# Patient Record
Sex: Male | Born: 1960 | Race: White | Hispanic: No | Marital: Married | Smoking: Current every day smoker
Health system: Southern US, Community
[De-identification: ages and names within clinical notes are randomized; demographics above are authoritative.]

## PROBLEM LIST (undated history)

## (undated) DIAGNOSIS — I10 Essential (primary) hypertension: Secondary | ICD-10-CM

## (undated) DIAGNOSIS — G4730 Sleep apnea, unspecified: Secondary | ICD-10-CM

## (undated) DIAGNOSIS — E785 Hyperlipidemia, unspecified: Secondary | ICD-10-CM

---

## 2007-12-07 IMAGING — CR DG CHEST 2V
1 series · 2 of 2 positions shown · non-contrast
Comparison: NONE

CLINICAL DATA: Attn:  DIONTE Cough. 

CHEST TWO VIEW (PA AND LATERAL)

[Series 1: view not recorded · 0.17mm/px · 2 of 2 slices shown]
[im 1/2]
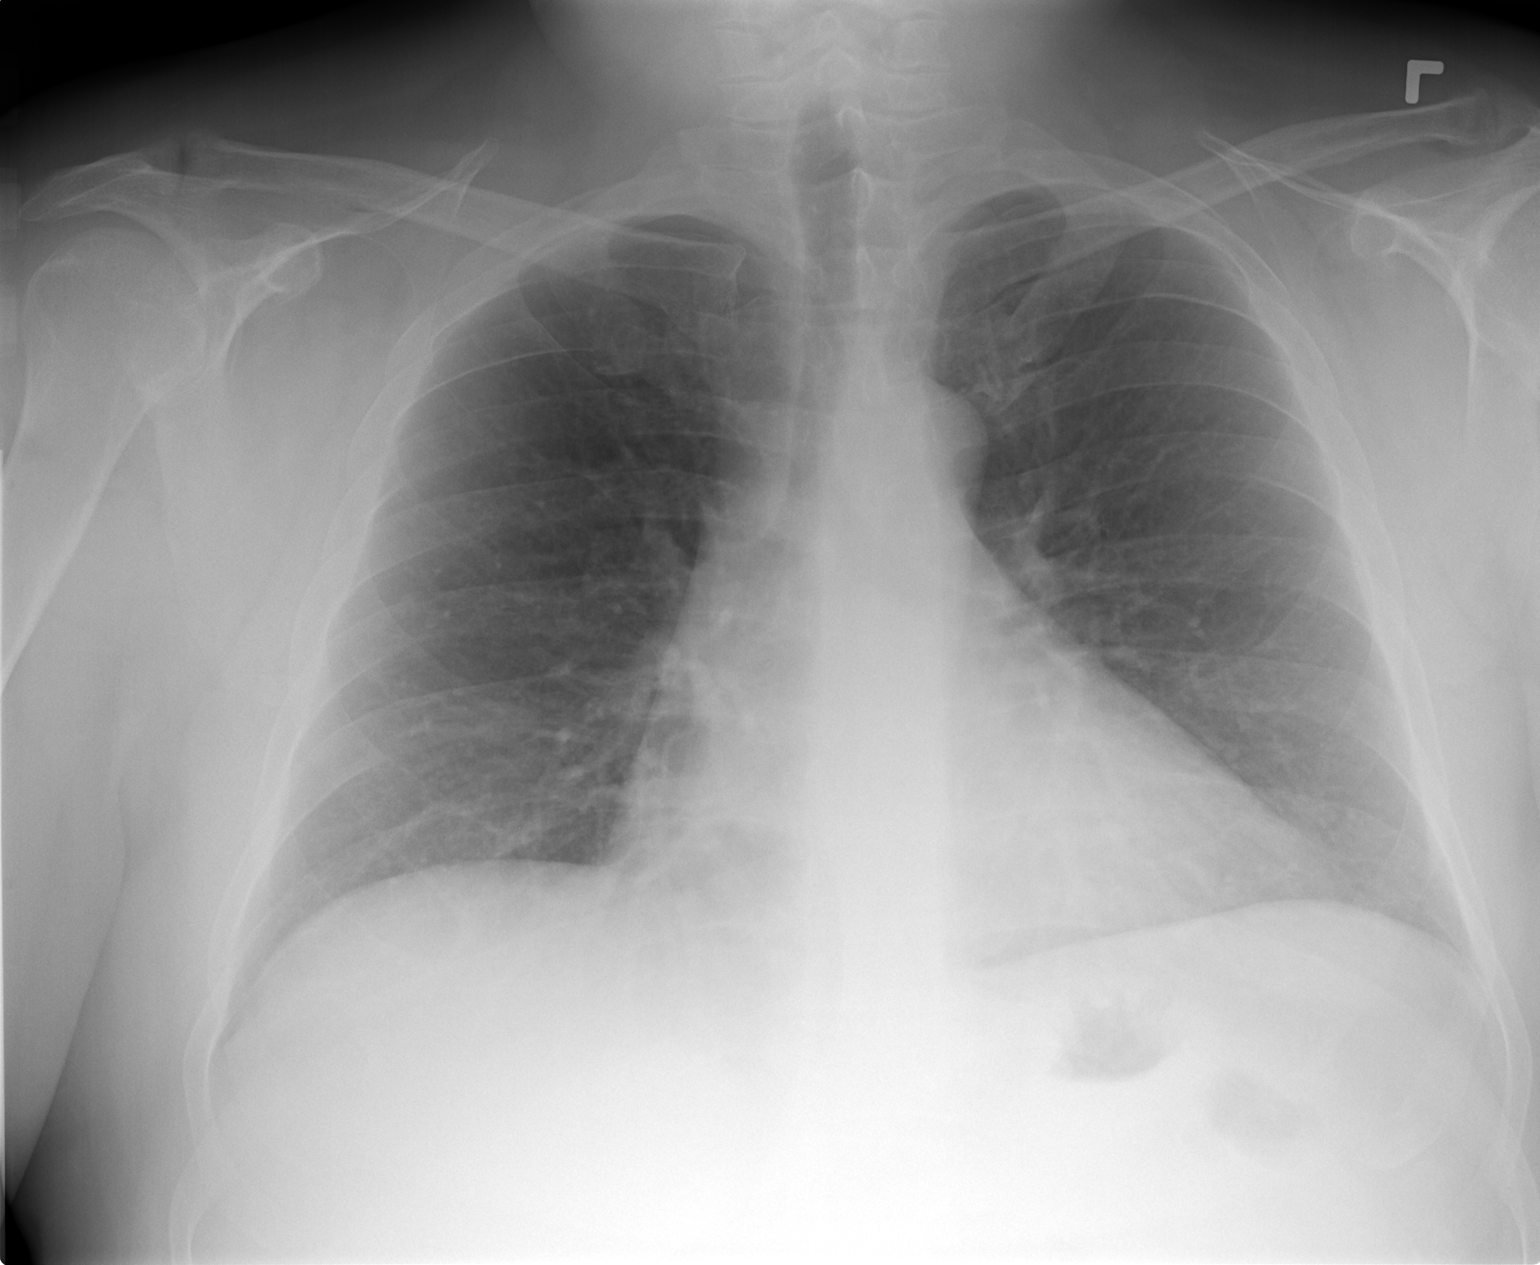
[im 2/2]
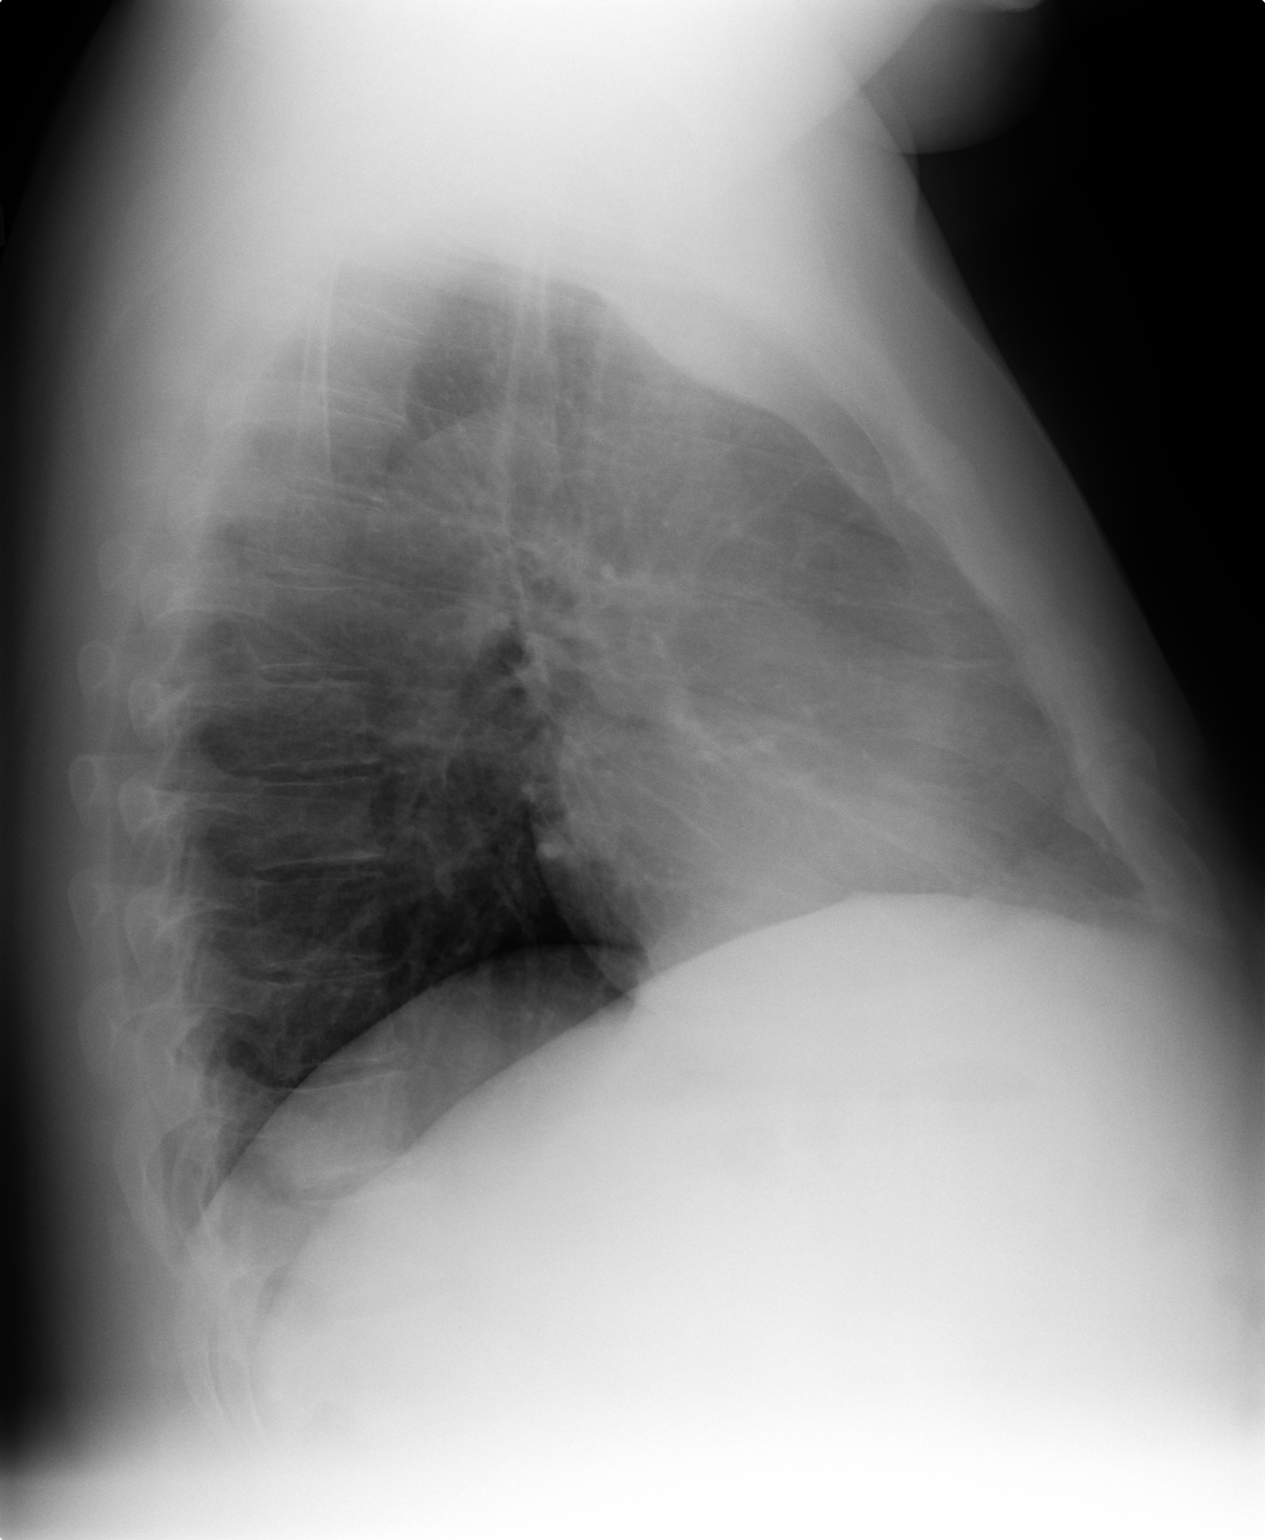

[2 of 2 positions shown; findings below may reference images not displayed]

FINDINGS: The lungs are clear and well expanded.   Heart and 
pulmonary vessels are normal.  No significant abnormalities are 
noted in the regional skeleton. 

electronically reviewed on [DATE] Dict Date: [DATE]  Tran 
Date:  [DATE] DIONTE

## 2017-04-01 ENCOUNTER — Other Ambulatory Visit (HOSPITAL_BASED_OUTPATIENT_CLINIC_OR_DEPARTMENT_OTHER): Payer: Self-pay

## 2017-04-01 DIAGNOSIS — G4733 Obstructive sleep apnea (adult) (pediatric): Secondary | ICD-10-CM

## 2017-04-01 DIAGNOSIS — R0902 Hypoxemia: Secondary | ICD-10-CM

## 2017-04-15 ENCOUNTER — Ambulatory Visit (HOSPITAL_BASED_OUTPATIENT_CLINIC_OR_DEPARTMENT_OTHER): Payer: 59 | Attending: Internal Medicine | Admitting: Internal Medicine

## 2017-04-15 DIAGNOSIS — R0902 Hypoxemia: Secondary | ICD-10-CM

## 2017-04-15 DIAGNOSIS — G4733 Obstructive sleep apnea (adult) (pediatric): Secondary | ICD-10-CM | POA: Diagnosis not present

## 2017-04-17 NOTE — Procedures (Signed)
   NAME: Philip Rhodes DATE OF BIRTH:  1960-03-18 MEDICAL RECORD NUMBER [id number]  LOCATION: [facility]  PHYSICIAN: Deretha EmoryJames C Osborne  DATE OF STUDY: 04/15/2017  SLEEP STUDY TYPE: Nocturnal Polysomnogram               REFERRING PHYSICIAN: Deretha Emorysborne, James C, MD  INDICATION FOR STUDY: CPAP titration needed as patient had inadequate oxygenation on APAP at home despite good airway control  EPWORTH SLEEPINESS SCORE:  2 HEIGHT: 5\' 8"  (172.7 cm)  WEIGHT: 295 lb (133.8 kg)    Body mass index is 44.85 kg/m.  NECK SIZE: 20 in.  MEDICATIONS Patient self administered medications include: N/A. Medications administered during study include No sleep medicine administered.Marland Kitchen.  SLEEP STUDY TECHNIQUE The patient underwent an attended overnight polysomnography titration to assess the effects of cpap therapy. The following variables were monitored: EEG(C4-A1, C3-A2, O1-A2, O2-A1), EOG, submental and leg EMG, ECG, oxyhemoglobin saturation by pulse oximetry, thoracic and abdominal respiratory effort belts, nasal/oral airflow by pressure sensor, body position sensor and snoring sensor. CPAP pressure was titrated to eliminate apneas, hypopneas and oxygen desaturation.  TECHNICAL COMMENTS Comments added by Technician: Patient was restless all through the night. Patient had difficulty initiating sleep. Comments added by Scorer: N/A  SLEEP ARCHITECTURE The study was initiated at 10:53:09 PM and terminated at 5:23:45 AM. Total recorded time was 390.6 minutes. EEG confirmed total sleep time was 316.0 minutes yielding a sleep efficiency of 80.9%. Sleep onset after lights out was 11.2 minutes with a REM latency of 180.0 minutes. The patient spent 11.23% of the night in stage N1 sleep, 68.83% in stage N2 sleep, 0.00% in stage N3 and 19.94% in REM. The Arousal Index was 32.1/hour.  RESPIRATORY PARAMETERS The overall AHI was 5.3 per hour, and the RDI was 14.8 events/hour with a central apnea  index of 0.0per hour. The most appropriate setting of CPAP was IPAP/EPAP 19/19 cm H2O. At this setting, the sleep efficiency was 89% and the patient was supine for 100%, the AHI was 0.0 events per hour, and the RDI was 1.7 events/hour (with 0.0 central events) and the arousal index was 8.4 per hour.The oxygen nadir was 92.0% during sleep.  LEG MOVEMENT DATA The total leg movements were with a resulting leg movement index of 0/hr. Associated arousal with leg movement index was 0.0/hr.  CARDIAC DATA The underlying cardiac rhythm was most consistent with sinus rhythm. Mean heart rate during sleep was 59 bpm. Additional rhythm abnormalities include None.  IMPRESSIONS - Successful CPAP tiration to a pressure of 19 with good airway control and adequate oxygenation  DIAGNOSIS - Obstructive Sleep Apnea (327.23 [G47.33 ICD-10])  RECOMMENDATIONS - Use of CPAP 19 with ramp to allow him to get to sleep adequately before pressure get too high.   Deretha EmoryJames C Osborne Sleep specialist, [employer]  [employer]  ELECTRONICALLY SIGNED ON:  04/17/2017, 9:18 PM [facility] PH: [phone]   FX: [phone] ACCREDITED BY THE [employer] SLE[employer]IC[employer]

## 2018-04-11 IMAGING — CR DG ABDOMEN 2V
3 series · 3 of 3 positions shown · non-contrast
Comparison: [DATE] [DATE], [DATE].  [DATE] [DATE], [DATE].

CLINICAL DATA: Biliary stent.

EXAM:
ABDOMEN - 2 VIEW

[w abdomen upright]
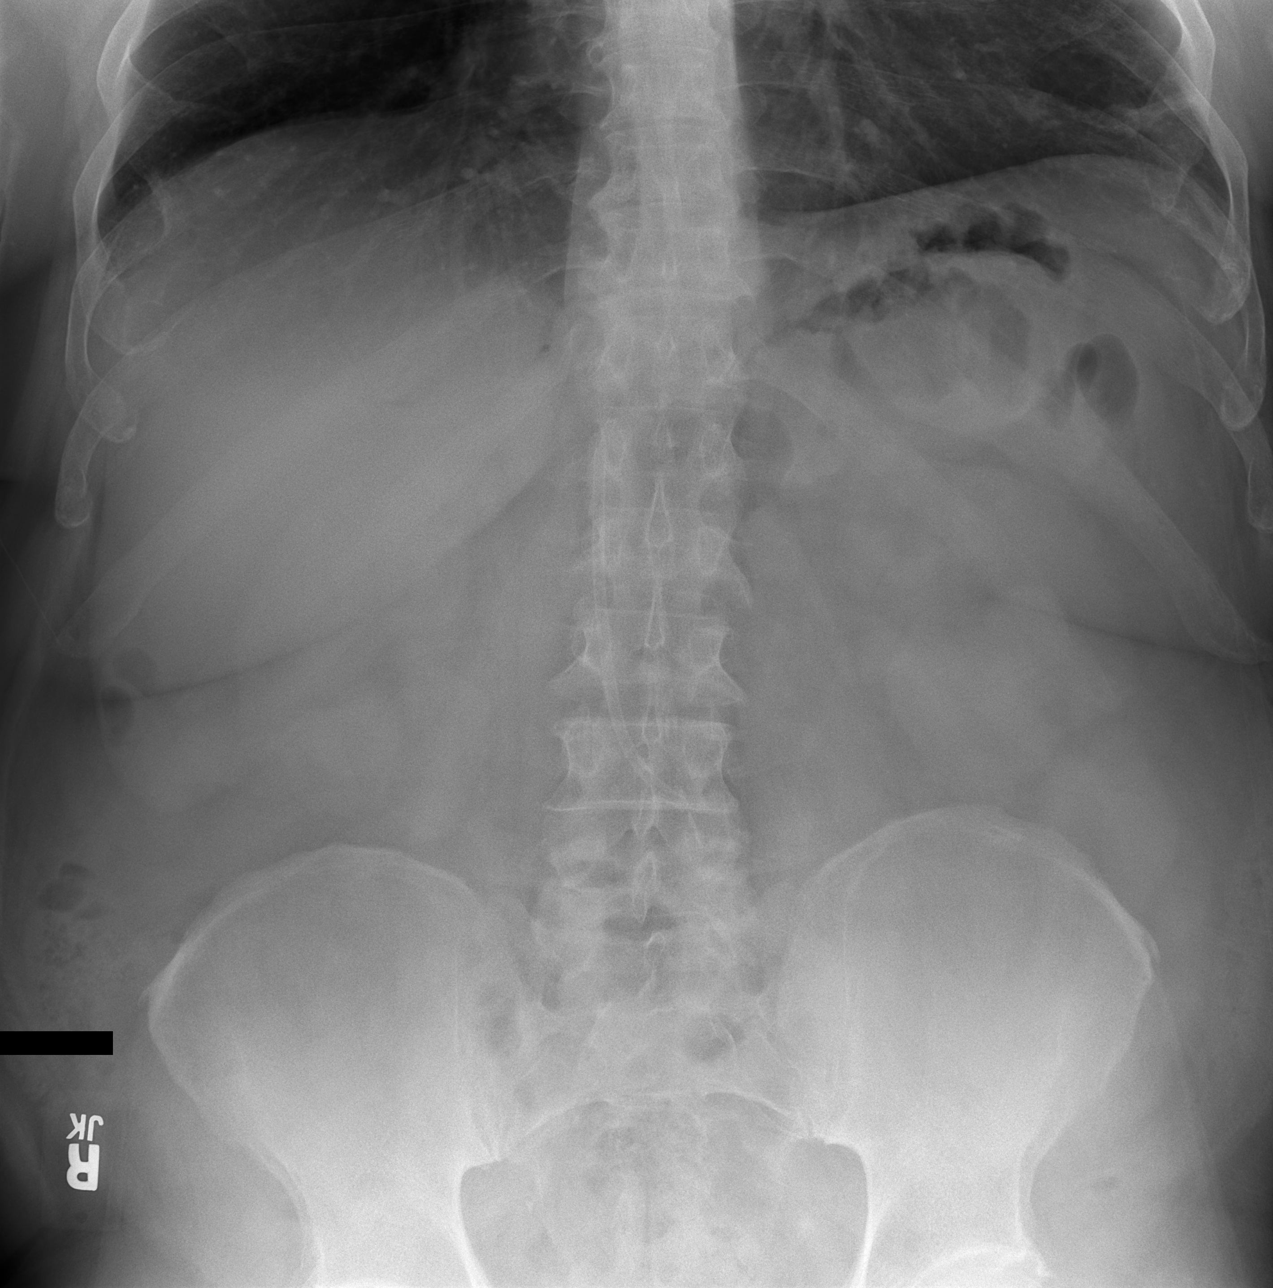

[t abdomen supine (1 of 2)]
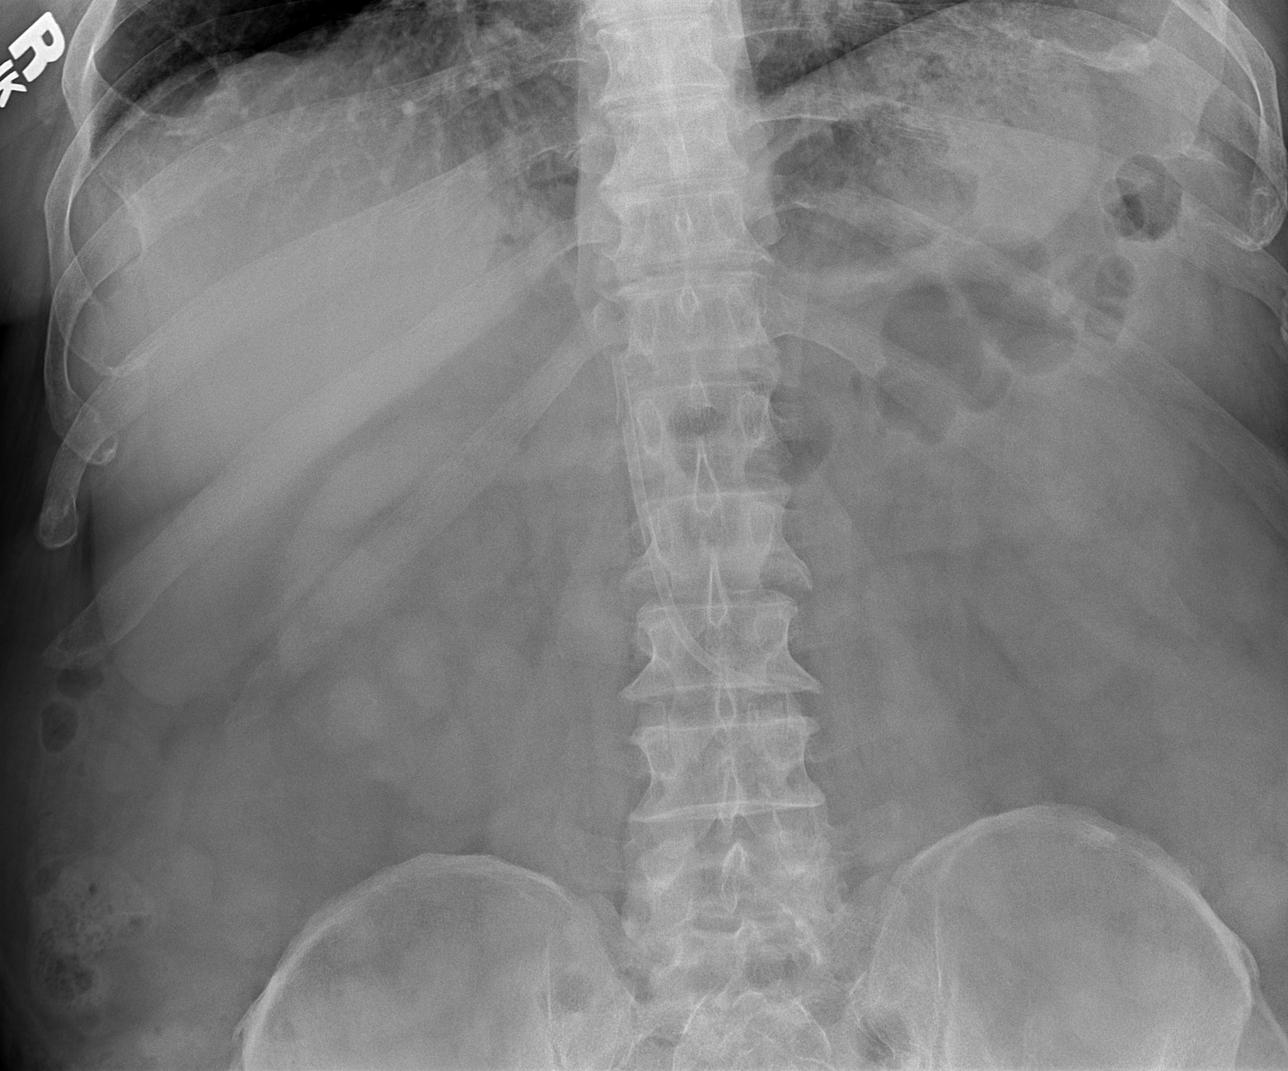

[t abdomen supine (2 of 2)]
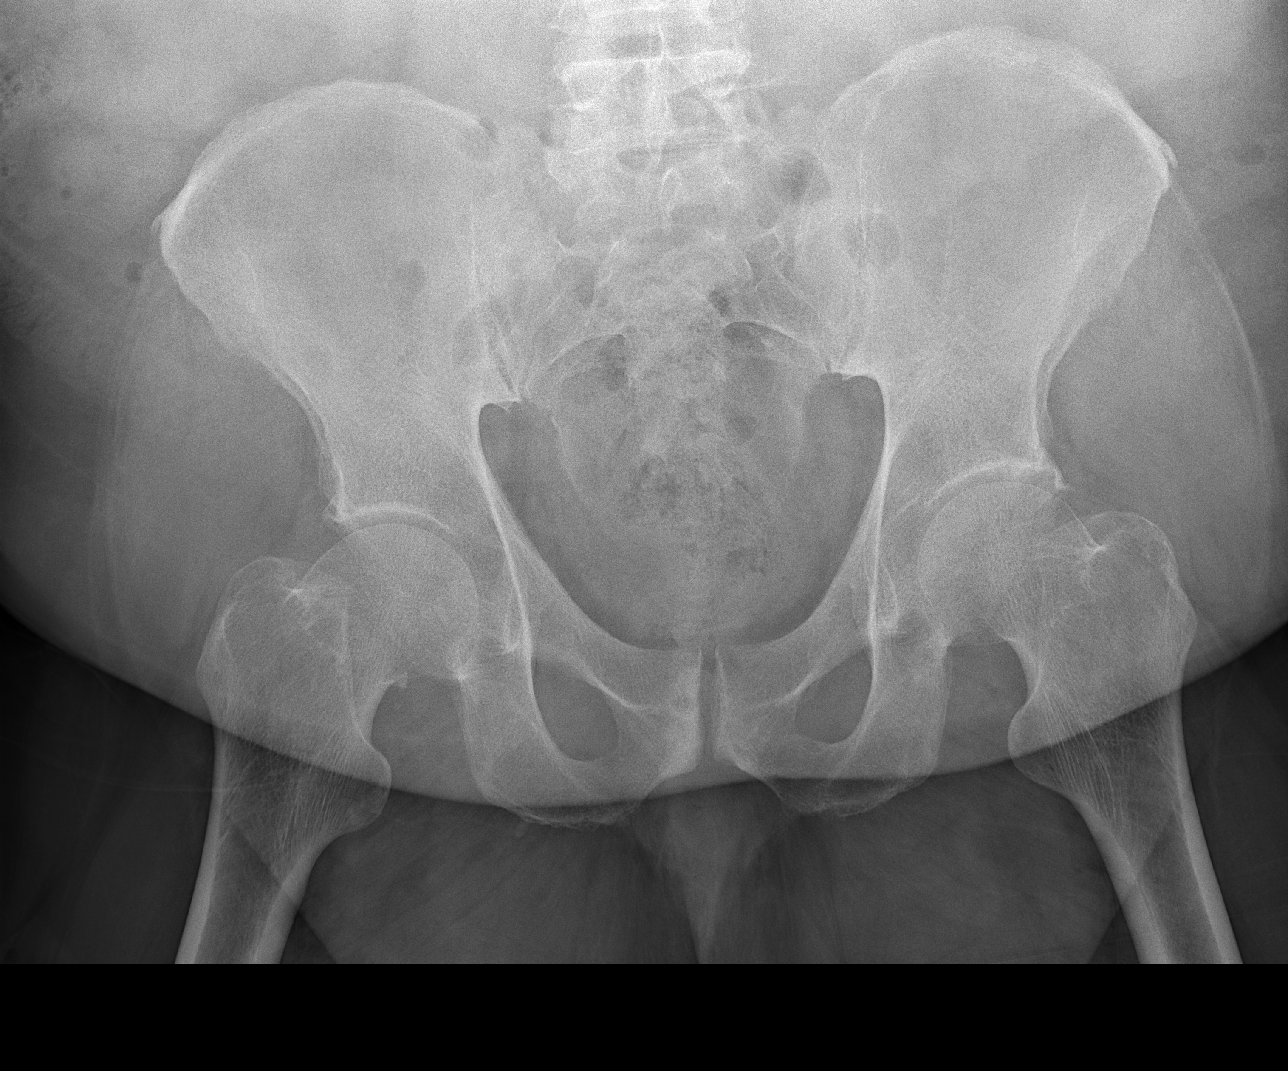

[3 of 3 positions shown; findings below may reference images not displayed]

FINDINGS: The bowel gas pattern is normal. There is no evidence of free air.
Stable position of biliary stent is noted, which most likely is
within the distal common bile duct and extension into the duodenum.
IMPRESSION: Grossly stable position of bile duct stent as described above.

## 2018-11-11 ENCOUNTER — Other Ambulatory Visit: Payer: Self-pay | Admitting: Family Medicine

## 2018-11-11 ENCOUNTER — Inpatient Hospital Stay (HOSPITAL_COMMUNITY)
Admission: EM | Admit: 2018-11-11 | Discharge: 2018-11-15 | DRG: 439 | Disposition: A | Discharge status: Home / Self Care | Payer: 59 | Source: Ambulatory Visit | Attending: Internal Medicine | Admitting: Internal Medicine

## 2018-11-11 ENCOUNTER — Other Ambulatory Visit: Payer: Self-pay

## 2018-11-11 ENCOUNTER — Ambulatory Visit
Admission: RE | Admit: 2018-11-11 | Discharge: 2018-11-11 | Disposition: A | Discharge status: Home / Self Care | Payer: 59 | Source: Ambulatory Visit | Attending: Family Medicine | Admitting: Family Medicine

## 2018-11-11 ENCOUNTER — Encounter (HOSPITAL_COMMUNITY): Payer: Self-pay

## 2018-11-11 ENCOUNTER — Emergency Department (HOSPITAL_COMMUNITY): Payer: 59

## 2018-11-11 DIAGNOSIS — Z79899 Other long term (current) drug therapy: Secondary | ICD-10-CM

## 2018-11-11 DIAGNOSIS — K760 Fatty (change of) liver, not elsewhere classified: Secondary | ICD-10-CM | POA: Diagnosis present

## 2018-11-11 DIAGNOSIS — R945 Abnormal results of liver function studies: Secondary | ICD-10-CM | POA: Diagnosis not present

## 2018-11-11 DIAGNOSIS — Z8249 Family history of ischemic heart disease and other diseases of the circulatory system: Secondary | ICD-10-CM

## 2018-11-11 DIAGNOSIS — K859 Acute pancreatitis without necrosis or infection, unspecified: Secondary | ICD-10-CM | POA: Diagnosis not present

## 2018-11-11 DIAGNOSIS — R7303 Prediabetes: Secondary | ICD-10-CM | POA: Diagnosis present

## 2018-11-11 DIAGNOSIS — F17210 Nicotine dependence, cigarettes, uncomplicated: Secondary | ICD-10-CM | POA: Diagnosis present

## 2018-11-11 DIAGNOSIS — G4730 Sleep apnea, unspecified: Secondary | ICD-10-CM | POA: Diagnosis present

## 2018-11-11 DIAGNOSIS — Z6841 Body Mass Index (BMI) 40.0 and over, adult: Secondary | ICD-10-CM

## 2018-11-11 DIAGNOSIS — K863 Pseudocyst of pancreas: Secondary | ICD-10-CM | POA: Diagnosis present

## 2018-11-11 DIAGNOSIS — K831 Obstruction of bile duct: Secondary | ICD-10-CM

## 2018-11-11 DIAGNOSIS — K852 Alcohol induced acute pancreatitis without necrosis or infection: Secondary | ICD-10-CM | POA: Diagnosis not present

## 2018-11-11 DIAGNOSIS — Z7982 Long term (current) use of aspirin: Secondary | ICD-10-CM

## 2018-11-11 DIAGNOSIS — R109 Unspecified abdominal pain: Secondary | ICD-10-CM

## 2018-11-11 DIAGNOSIS — I10 Essential (primary) hypertension: Secondary | ICD-10-CM | POA: Diagnosis not present

## 2018-11-11 DIAGNOSIS — Z20828 Contact with and (suspected) exposure to other viral communicable diseases: Secondary | ICD-10-CM | POA: Diagnosis present

## 2018-11-11 DIAGNOSIS — R7989 Other specified abnormal findings of blood chemistry: Secondary | ICD-10-CM | POA: Diagnosis present

## 2018-11-11 DIAGNOSIS — E785 Hyperlipidemia, unspecified: Secondary | ICD-10-CM | POA: Diagnosis present

## 2018-11-11 DIAGNOSIS — K7010 Alcoholic hepatitis without ascites: Secondary | ICD-10-CM | POA: Diagnosis present

## 2018-11-11 HISTORY — DX: Hyperlipidemia, unspecified: E78.5

## 2018-11-11 HISTORY — DX: Essential (primary) hypertension: I10

## 2018-11-11 HISTORY — DX: Sleep apnea, unspecified: G47.30

## 2018-11-11 LAB — COMPREHENSIVE METABOLIC PANEL
ALT: 169 U/L — ABNORMAL HIGH (ref 0–44)
AST: 88 U/L — ABNORMAL HIGH (ref 15–41)
Albumin: 3.7 g/dL (ref 3.5–5.0)
Alkaline Phosphatase: 87 U/L (ref 38–126)
Anion gap: 15 (ref 5–15)
BUN: 5 mg/dL — ABNORMAL LOW (ref 6–20)
CO2: 21 mmol/L — ABNORMAL LOW (ref 22–32)
Calcium: 9 mg/dL (ref 8.9–10.3)
Chloride: 91 mmol/L — ABNORMAL LOW (ref 98–111)
Creatinine, Ser: 0.58 mg/dL — ABNORMAL LOW (ref 0.61–1.24)
GFR calc Af Amer: >60 mL/min (ref >60)
GFR calc non Af Amer: >60 mL/min (ref >60)
Glucose, Bld: 191 mg/dL — ABNORMAL HIGH (ref 70–99)
Potassium: 3.1 mmol/L — ABNORMAL LOW (ref 3.5–5.1)
Sodium: 127 mmol/L — ABNORMAL LOW (ref 135–145)
Total Bilirubin: 2.4 mg/dL — ABNORMAL HIGH (ref 0.3–1.2)
Total Protein: 7.5 g/dL (ref 6.5–8.1)

## 2018-11-11 LAB — CBC WITH DIFFERENTIAL/PLATELET
Abs Immature Granulocytes: 0.1 10*3/uL — ABNORMAL HIGH (ref 0.00–0.07)
Basophils Absolute: 0.1 10*3/uL (ref 0.0–0.1)
Basophils Relative: 1 %
Eosinophils Absolute: 0.2 10*3/uL (ref 0.0–0.5)
Eosinophils Relative: 1 %
HCT: 46.4 % (ref 39.0–52.0)
Hemoglobin: 16.1 g/dL (ref 13.0–17.0)
Immature Granulocytes: 1 %
Lymphocytes Relative: 17 %
Lymphs Abs: 2.9 10*3/uL (ref 0.7–4.0)
MCH: 32.3 pg (ref 26.0–34.0)
MCHC: 34.7 g/dL (ref 30.0–36.0)
MCV: 93 fL (ref 80.0–100.0)
Monocytes Absolute: 1.5 10*3/uL — ABNORMAL HIGH (ref 0.1–1.0)
Monocytes Relative: 9 %
Neutro Abs: 12.1 10*3/uL — ABNORMAL HIGH (ref 1.7–7.7)
Neutrophils Relative %: 71 %
Platelets: 389 10*3/uL (ref 150–400)
RBC: 4.99 MIL/uL (ref 4.22–5.81)
RDW: 13.7 % (ref 11.5–15.5)
WBC: 16.9 10*3/uL — ABNORMAL HIGH (ref 4.0–10.5)
nRBC: 0 % (ref 0.0–0.2)

## 2018-11-11 LAB — LACTIC ACID, PLASMA: Lactic Acid, Venous: 1.6 mmol/L (ref 0.5–1.9)

## 2018-11-11 LAB — URINALYSIS, ROUTINE W REFLEX MICROSCOPIC
Bacteria, UA: NONE SEEN
Bilirubin Urine: NEGATIVE
Glucose, UA: NEGATIVE mg/dL
Hgb urine dipstick: NEGATIVE
Ketones, ur: 5 mg/dL — AB
Leukocytes,Ua: NEGATIVE
Nitrite: NEGATIVE
Protein, ur: 30 mg/dL — AB
Specific Gravity, Urine: 1.029 (ref 1.005–1.030)
pH: 6 (ref 5.0–8.0)

## 2018-11-11 LAB — PROTIME-INR
INR: 1.1 (ref 0.8–1.2)
Prothrombin Time: 13.7 seconds (ref 11.4–15.2)

## 2018-11-11 LAB — LIPASE, BLOOD: Lipase: 390 U/L — ABNORMAL HIGH (ref 11–51)

## 2018-11-11 IMAGING — DX CHEST - 2 VIEW
2 series · 2 of 2 positions shown · non-contrast
Comparison: [DATE]

CLINICAL DATA: Sepsis.  Hypertension.

EXAM:
CHEST - 2 VIEW

[chest pa]
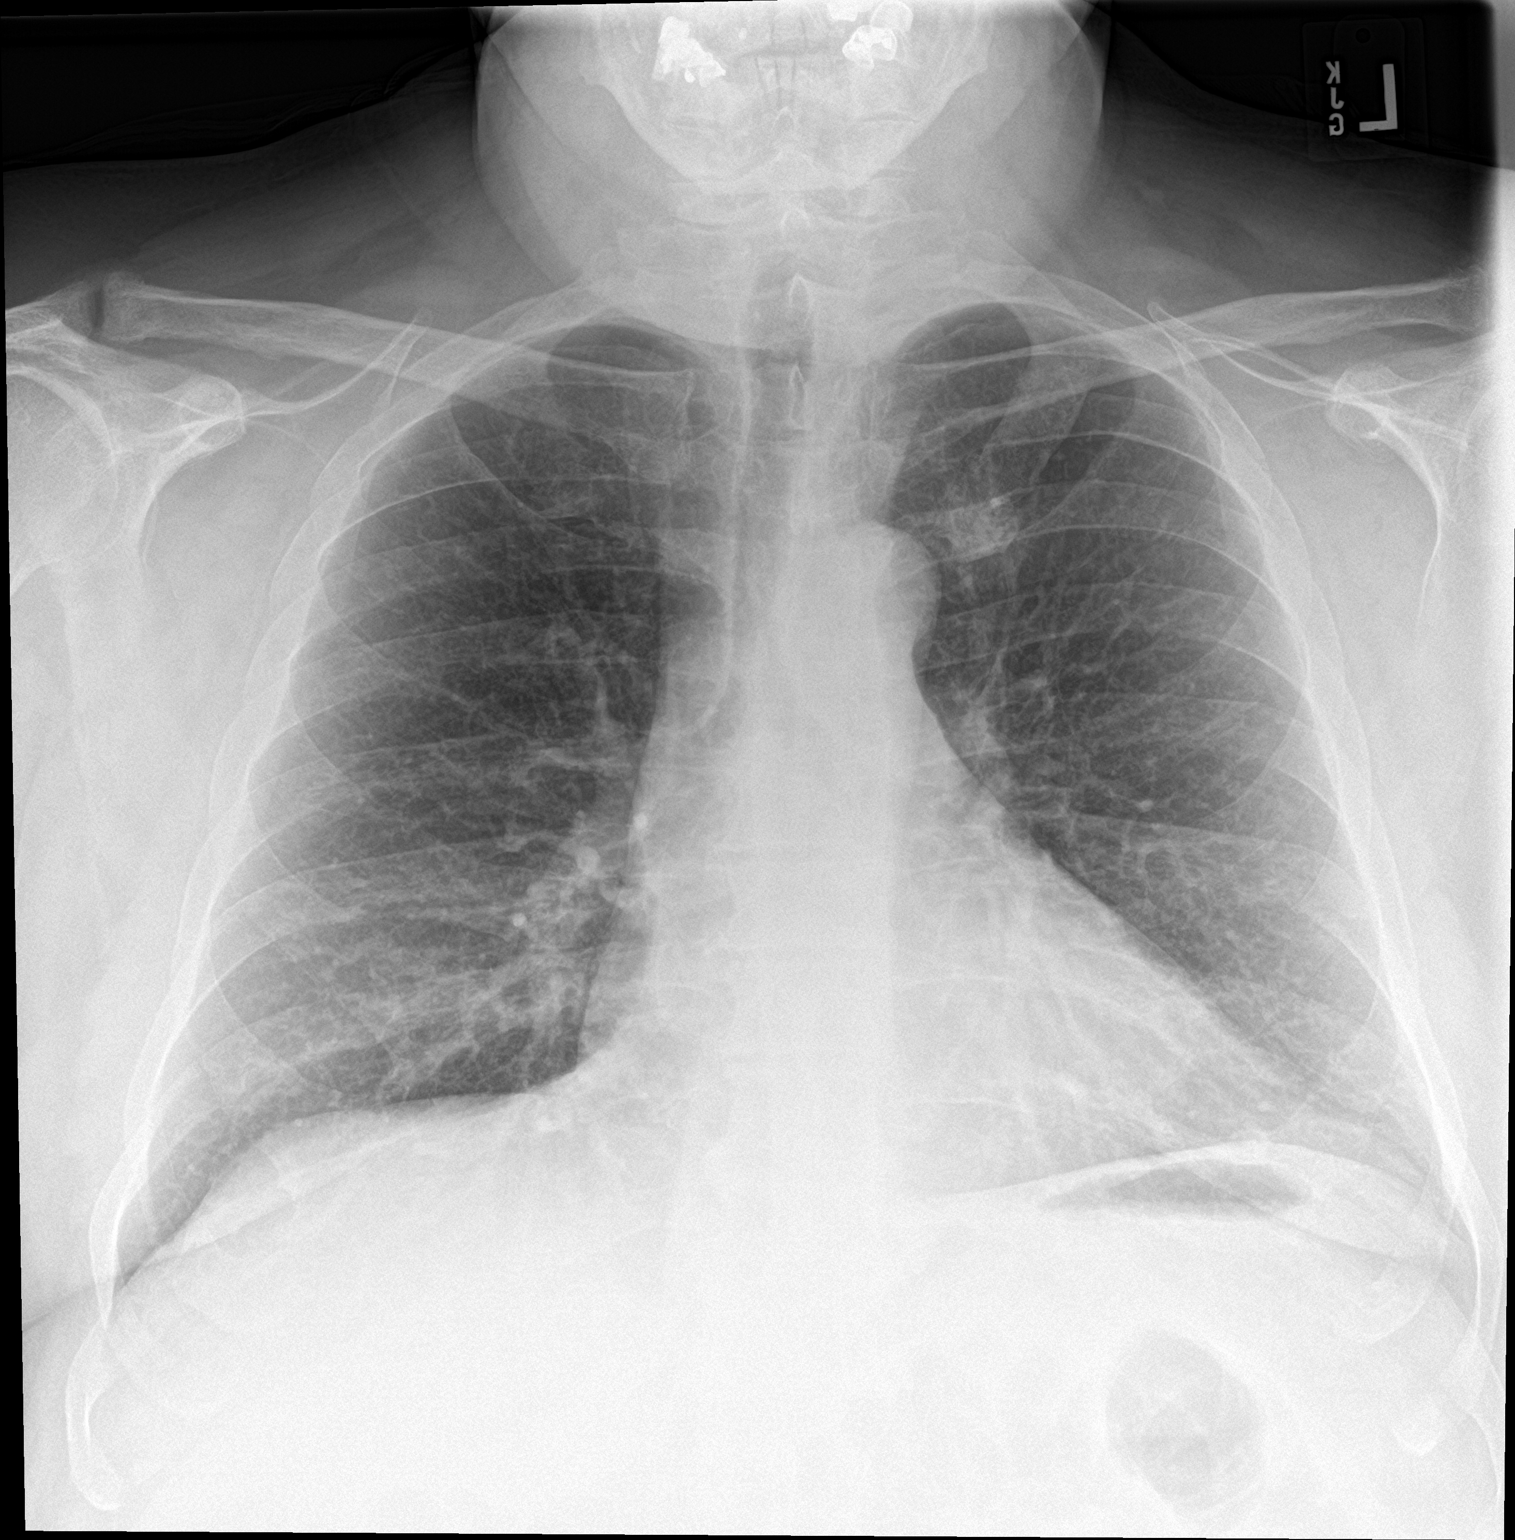

[chest lat]
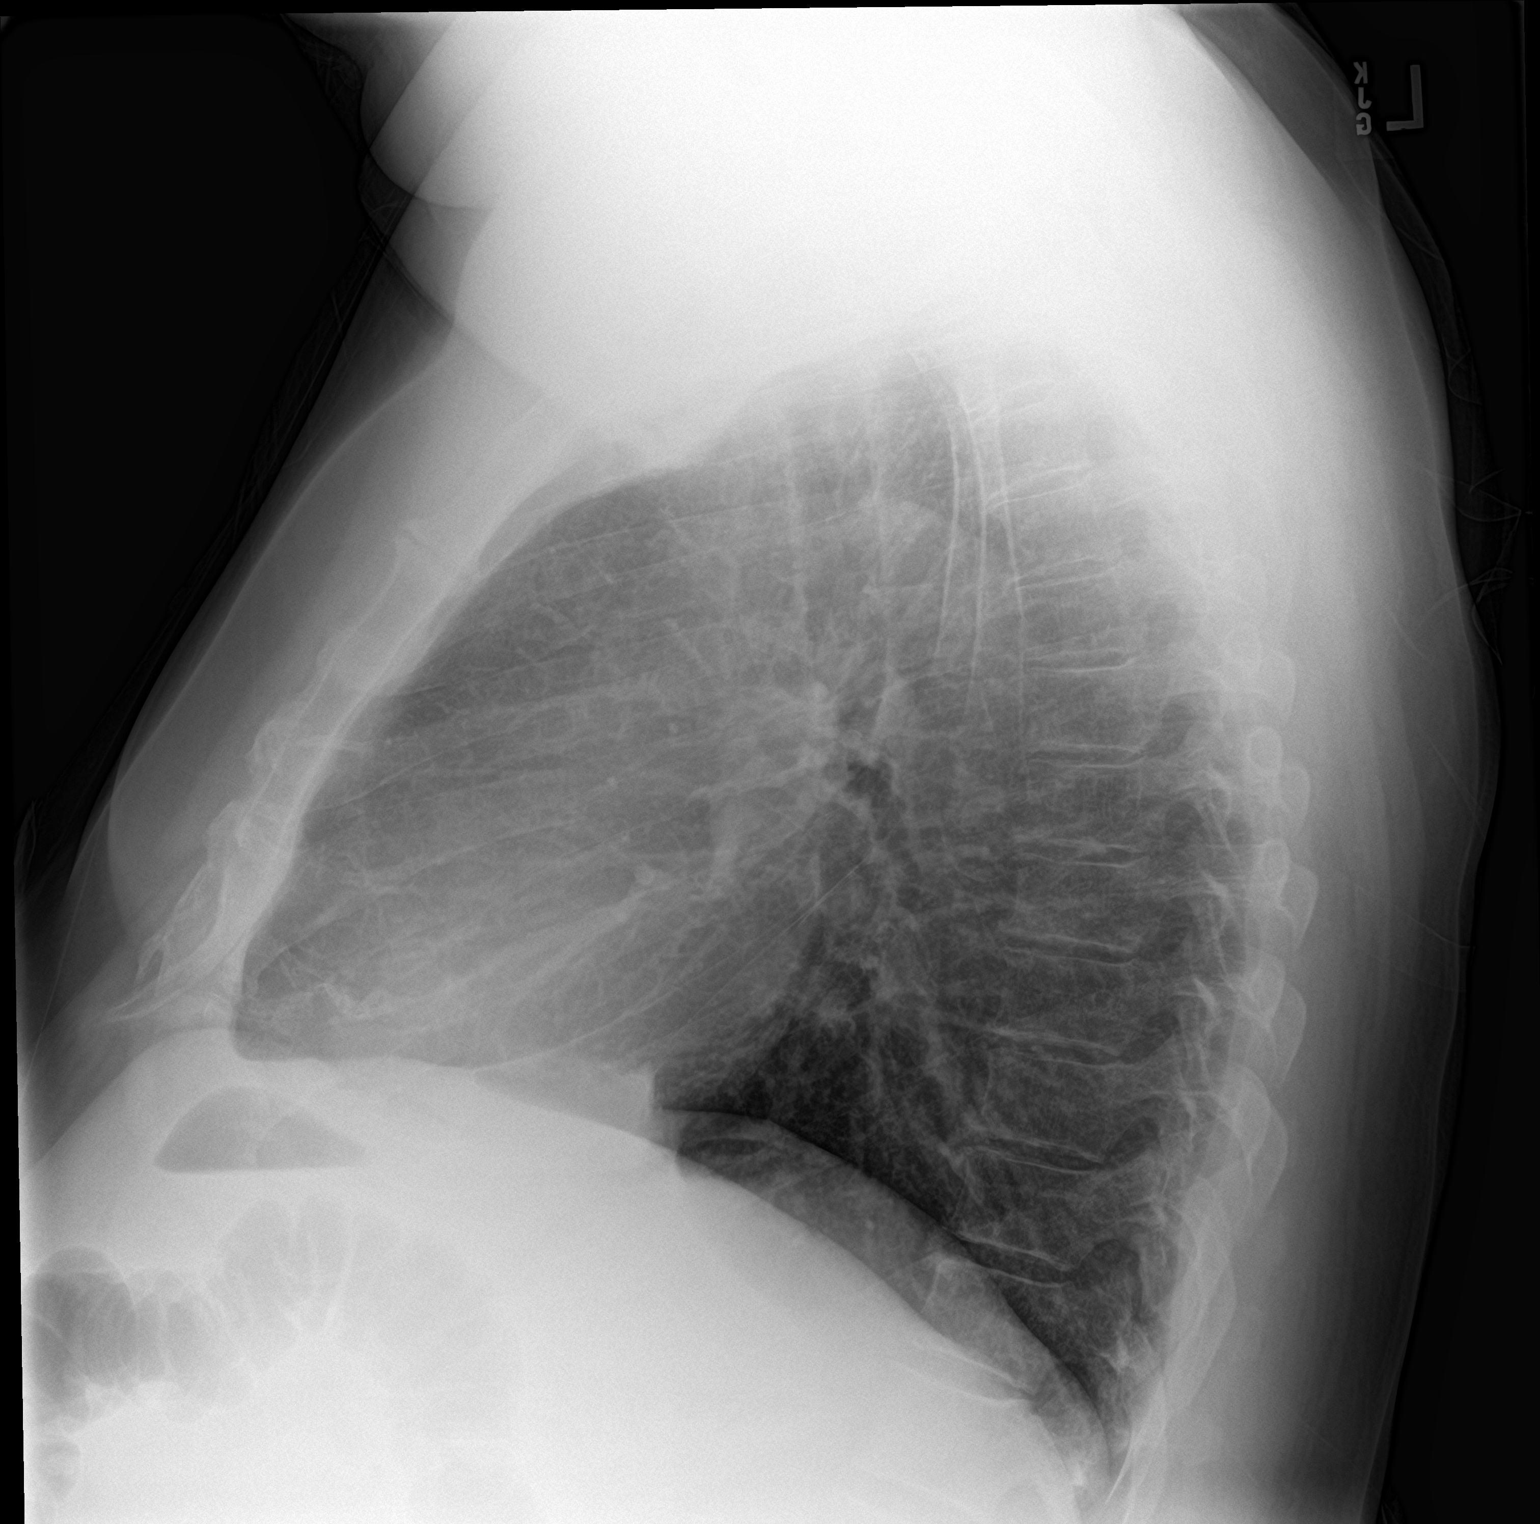

[2 of 2 positions shown; findings below may reference images not displayed]

FINDINGS: Heart size is mildly enlarged. Mediastinal shadows are normal. No
evidence of pneumonia, collapse or effusion. Density in the left
upper chest region on the frontal view probably relates to the first
rib and, but the possibility of a left upper lobe mass is not
excluded. Consider chest CT. No bone finding otherwise.
IMPRESSION: No sign of pneumonia, collapse or effusion.

Asymmetric density in the upper left chest on the frontal view.
Whereas this could represent density related to the first rib end,
lung mass is not excluded and chest CT would be suggested.

## 2018-11-11 IMAGING — CT CT ABDOMEN AND PELVIS WITH CONTRAST
1 of 3 series · 13 of 32 positions shown, 19 images · IV contrast (APPLIED)
Comparison: None.

CLINICAL DATA: Acute generalized abdominal pain.

EXAM:
CT ABDOMEN AND PELVIS WITH CONTRAST
TECHNIQUE: Multidetector CT imaging of the abdomen and pelvis was performed
using the standard protocol following bolus administration of
intravenous contrast.
CONTRAST:  125mL [B8] IOPAMIDOL ([B8]) INJECTION 61%

[Series 2: abd/pelvis w/cm · axial · 0.98mm/px · z∈[-488,-68]mm · 13 of 98 slices shown, 19 images]
[im 7/98  soft-tissue]
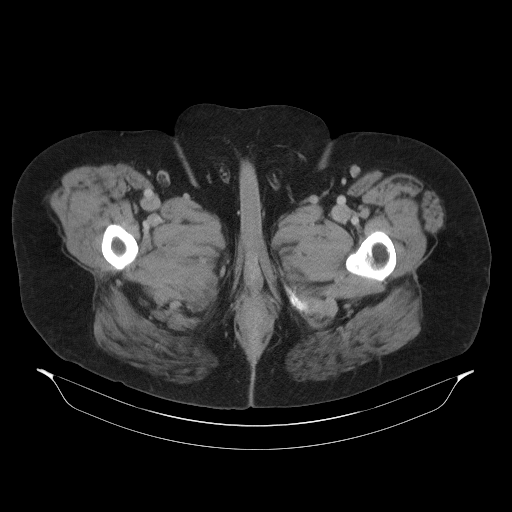
[im 7/98  bone]
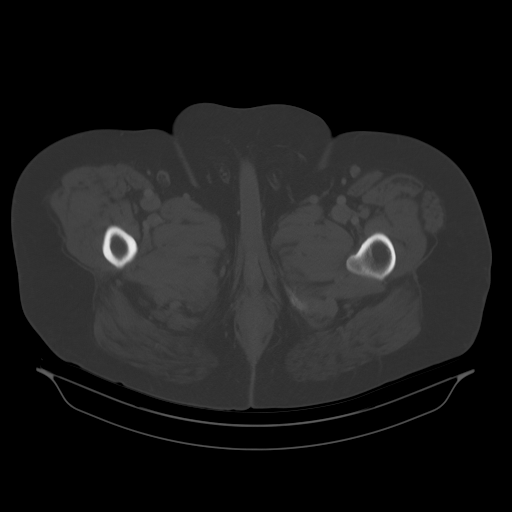
[im 14/98  soft-tissue]
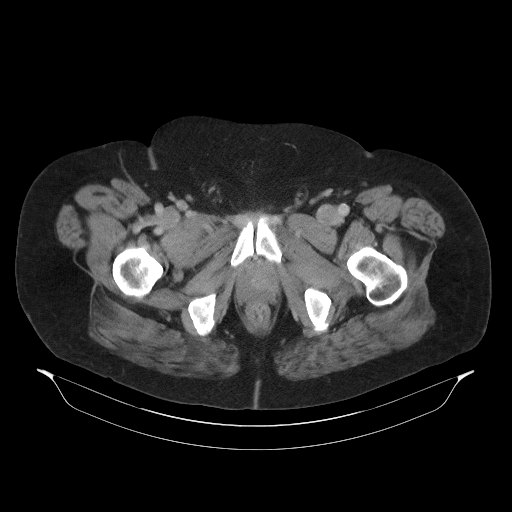
[im 21/98  soft-tissue]
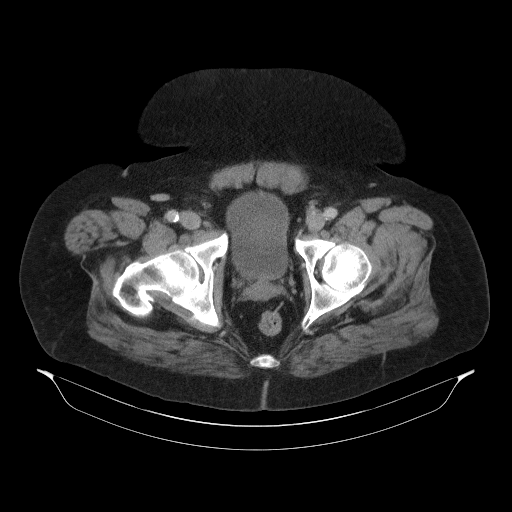
[im 28/98  soft-tissue]
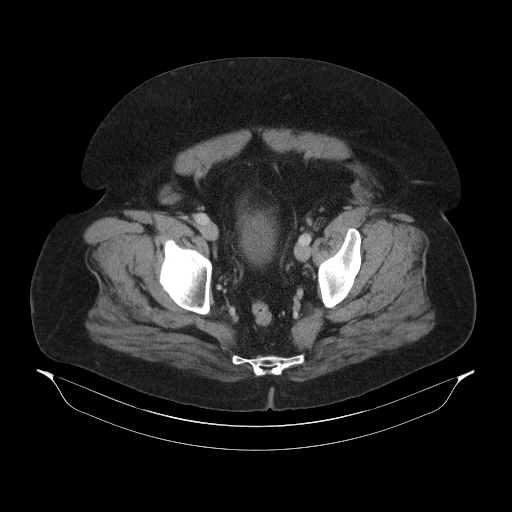
[im 35/98  soft-tissue]
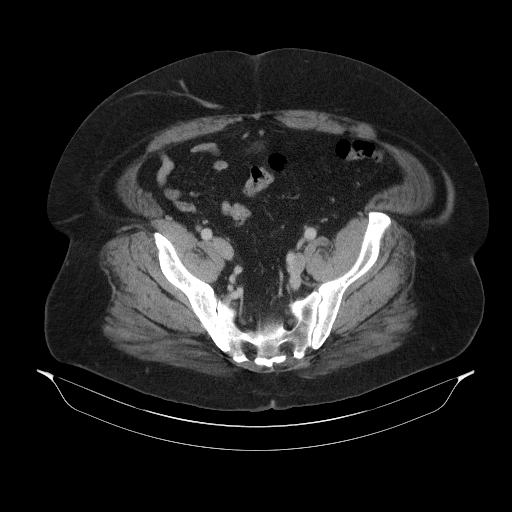
[im 42/98  soft-tissue]
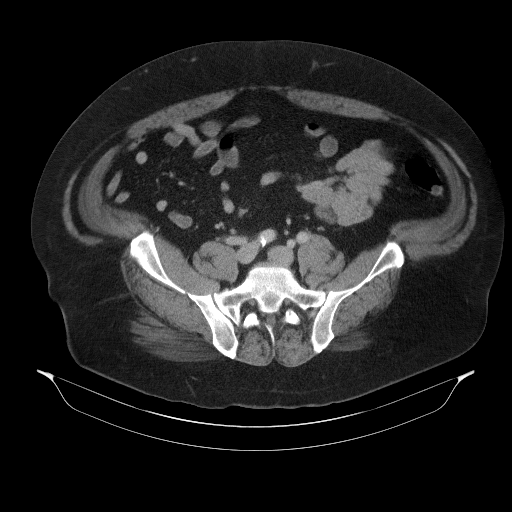
[im 49/98  soft-tissue]
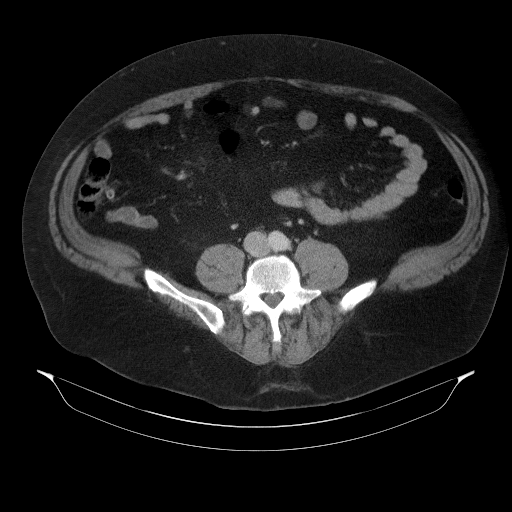
[im 56/98  soft-tissue]
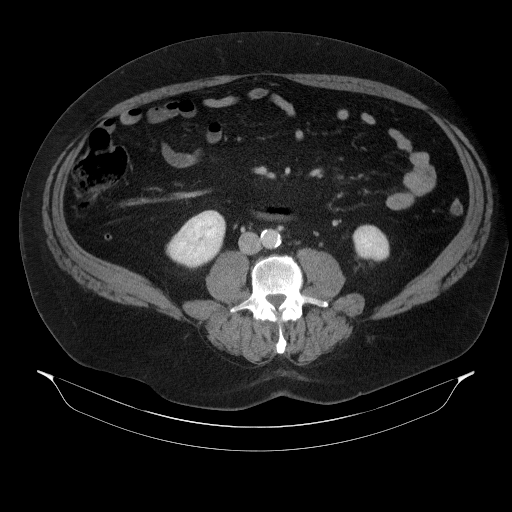
[im 63/98  soft-tissue]
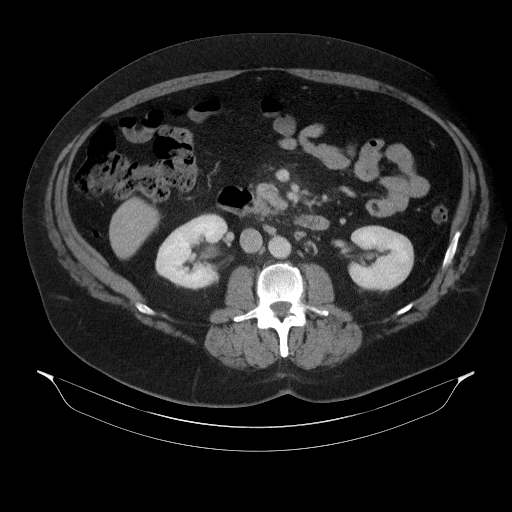
[im 63/98  bone]
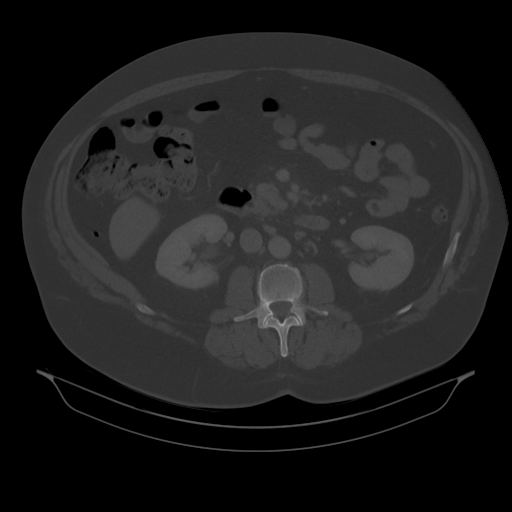
[im 70/98  soft-tissue]
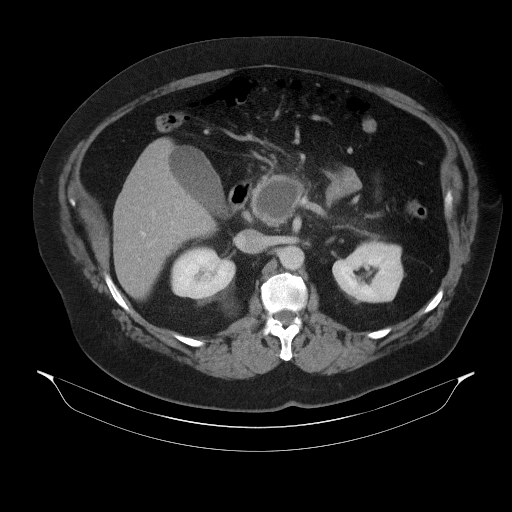
[im 70/98  lung]
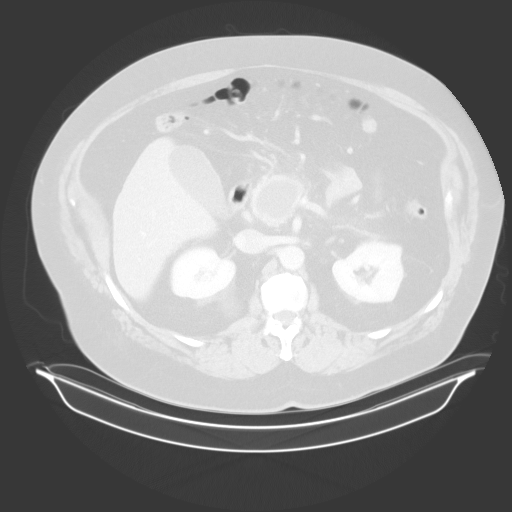
[im 77/98  soft-tissue]
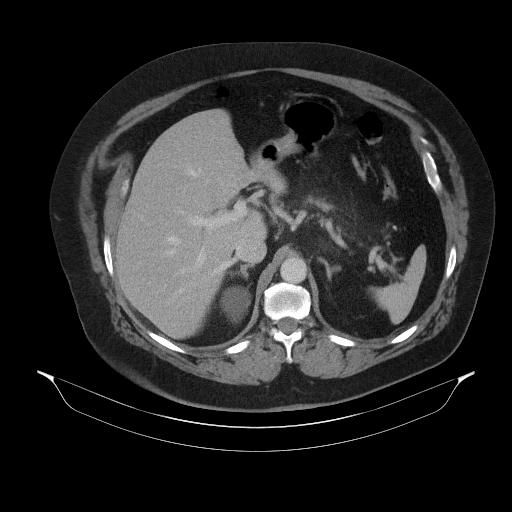
[im 77/98  lung]
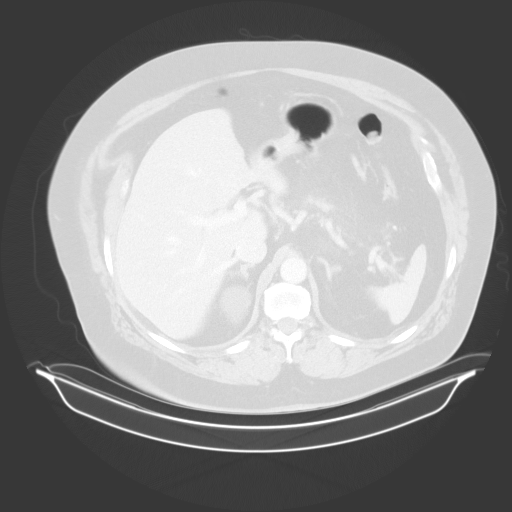
[im 84/98  soft-tissue]
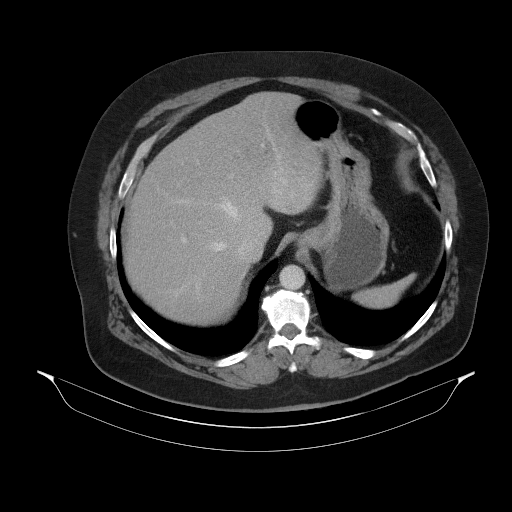
[im 84/98  lung]
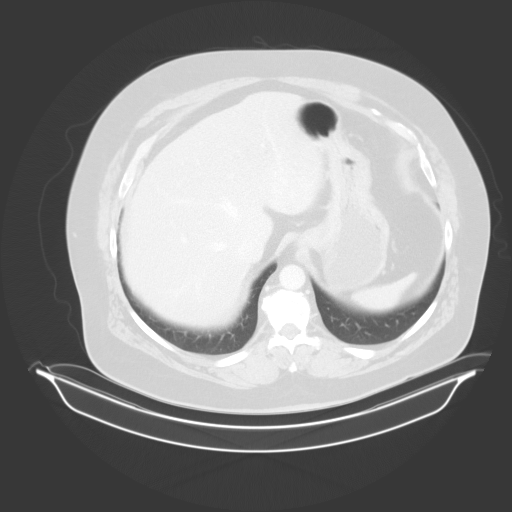
[im 91/98  soft-tissue]
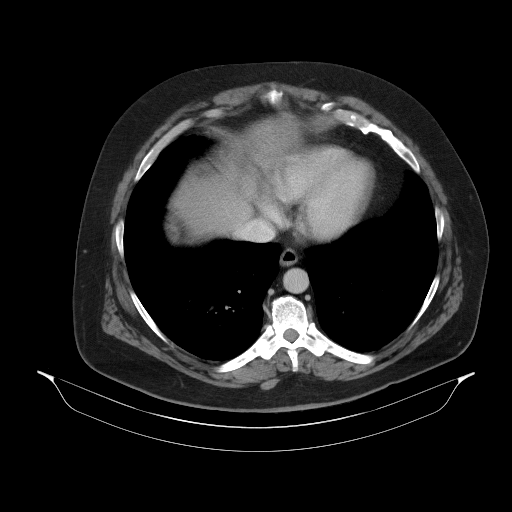
[im 91/98  lung]
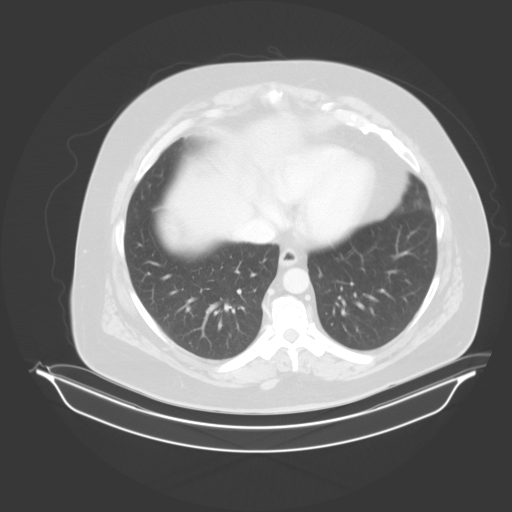

[13 of 32 positions shown; findings below may reference images not displayed]

FINDINGS: Lower chest: No acute abnormality.

Hepatobiliary: No focal liver abnormality is seen. No gallstones,
gallbladder wall thickening, or biliary dilatation.

Pancreas: Mild inflammatory changes are noted around the pancreas
suggesting acute pancreatitis. 4.5 cm fluid collection is noted in
the pancreatic head suggesting pseudocyst formation. No definite
ductal dilatation is noted.

Spleen: Normal in size without focal abnormality.

Adrenals/Urinary Tract: Adrenal glands appear normal. Simple
exophytic cyst is seen arising from upper pole of right kidney. No
hydronephrosis or renal obstruction is noted. No renal or ureteral
calculi are noted. Urinary bladder is unremarkable.

Stomach/Bowel: Stomach is within normal limits. Appendix appears
normal. No evidence of bowel wall thickening, distention, or
inflammatory changes.

Vascular/Lymphatic: Aortic atherosclerosis. No enlarged abdominal or
pelvic lymph nodes.

Reproductive: Prostate is unremarkable.

Other: No abdominal wall hernia or abnormality. No abdominopelvic
ascites.

Musculoskeletal: No acute or significant osseous findings.
IMPRESSION: Findings consistent with acute pancreatitis. 4.5 cm fluid collection
is noted in the pancreatic head most consistent with pseudocyst.

Aortic Atherosclerosis ([B8]-[B8]).

## 2018-11-11 MED ORDER — HYDRALAZINE HCL 20 MG/ML IJ SOLN: 10.0000 mg | INTRAMUSCULAR | Status: DC | PRN

## 2018-11-11 MED ORDER — POTASSIUM CHLORIDE 10 MEQ/100ML IV SOLN
10.0000 meq | INTRAVENOUS | Status: AC
  Administered 2018-11-11 – 2018-11-12 (×3): 10 meq via INTRAVENOUS
  Filled 2018-11-11 (×3): qty 100

## 2018-11-11 MED ORDER — ONDANSETRON HCL 4 MG PO TABS: 4.0000 mg | ORAL_TABLET | Freq: Four times a day (QID) | ORAL | Status: DC | PRN

## 2018-11-11 MED ORDER — SODIUM CHLORIDE 0.9 % IV SOLN
INTRAVENOUS | Status: AC
  Administered 2018-11-11 – 2018-11-12 (×4): via INTRAVENOUS

## 2018-11-11 MED ORDER — IOPAMIDOL (ISOVUE-300) INJECTION 61%
125.0000 mL | Freq: Once | INTRAVENOUS | Status: AC | PRN
  Administered 2018-11-11: 12:00:00 125 mL via INTRAVENOUS

## 2018-11-11 MED ORDER — MORPHINE SULFATE (PF) 2 MG/ML IV SOLN
1.0000 mg | INTRAVENOUS | Status: DC | PRN
  Administered 2018-11-12: 1 mg via INTRAVENOUS
  Filled 2018-11-11: qty 1

## 2018-11-11 MED ORDER — ONDANSETRON HCL 4 MG/2ML IJ SOLN
4.0000 mg | Freq: Four times a day (QID) | INTRAMUSCULAR | Status: DC | PRN
  Administered 2018-11-12: 01:00:00 4 mg via INTRAVENOUS
  Filled 2018-11-11: qty 2

## 2018-11-11 NOTE — H&P (Signed)
History and Physical    Philip Rhodes HLK:[id number] DOB: 19-Dec-1960 DOA: 11/11/2018  PCP: Patient, No Pcp Per  Patient coming from: Home.  Chief Complaint: Abnormal CT of the abdomen and pelvis.  HPI: Philip Rhodes is a 58 y.o. male with history of hypertension, hyperlipidemia, sleep apnea has been experiencing abdominal pain mostly in the epigastric area for last 2 days with no associated nausea vomiting or diarrhea.  Eyes any chest pain shortness of breath fever or chills.  Due to persistent pain patient had gone to his primary care physician who ordered CT abdomen pelvis which shows features concerning for acute pancreatitis with pseudocyst formation.  Patient states he only drinks alcohol on the weekends.   ED Course: In the ER patient was given pain relief medication and IV fluids.  Labs revealed sodium 127 potassium 3.1 glucose 191.  Creatinine is 9 hemoglobin 16 hematocrit 46.4 WBC count 16.9.  Lipase was 390 AST 88 ALT 169 total bilirubin 2.4.  CT scan did not show any gallbladder stones.  Patient admitted for further management of acute pancreatitis.  Chest x-ray was abnormal for which they have suggested CT chest.  Review of Systems: As per HPI, rest all negative.   Past Medical History:  Diagnosis Date   Hyperlipemia    Hypertension    Sleep apnea     History reviewed. No pertinent surgical history.   reports that he has been smoking. He has been smoking about 1.00 pack per day. He has never used smokeless tobacco. He reports current alcohol use. He reports previous drug use.  No Known Allergies  Family History  Problem Relation Age of Onset   Hypertension Mother     Prior to Admission medications   Medication Sig Start Date End Date Taking? Authorizing Provider  amLODipine (NORVASC) 5 MG tablet Take 5 mg by mouth daily. 11/11/18  Yes [provider]  Ascorbic Acid (VITAMIN C PO) Take 1 tablet by mouth daily.   Yes [provider]  aspirin  EC 81 MG tablet Take 81 mg by mouth at bedtime.   Yes [provider]  lisinopril-hydrochlorothiazide (ZESTORETIC) 20-12.5 MG tablet Take 1 tablet by mouth every morning. 11/02/18  Yes [provider]  Multiple Vitamins-Minerals (MULTIVITAMIN ADULT PO) Take 1 tablet by mouth daily.   Yes [provider]  Omega-3 Fatty Acids (FISH OIL PO) Take 1 tablet by mouth daily.   Yes [provider]  simvastatin (ZOCOR) 40 MG tablet Take 40 mg by mouth every evening. 09/10/18  Yes [provider]  HYDROcodone-acetaminophen (NORCO/VICODIN) 5-325 MG tablet Take 1 tablet by mouth every 6 (six) hours as needed for pain. 11/11/18   [provider]    Physical Exam: Constitutional: Moderately built and nourished. Vitals:   11/11/18 2025 11/11/18 2145 11/11/18 2215 11/11/18 2230  BP:  135/72 116/68 (!) 156/75  Pulse:  78 92 81  Resp:      Temp:      TempSrc:      SpO2:  98% 98% 97%  Weight: 136.1 kg     Height: 5\' 8"  (1.727 m)      Eyes: Anicteric no pallor. ENMT: No discharge from the ears eyes nose or mouth. Neck: No mass felt.  No neck rigidity. Respiratory: No rhonchi or crepitations. Cardiovascular: S1-S2 heard. Abdomen: Soft nontender bowel sounds present. Musculoskeletal: No edema.  No joint effusion. Skin: No rash. Neurologic: Alert awake oriented to time place and person.  Moves all extremities. Psychiatric: Appears  normal.   Labs on Admission: I have personally reviewed following labs and imaging studies  CBC: Recent Labs  Lab 11/11/18 2050  WBC 16.9*  NEUTROABS 12.1*  HGB 16.1  HCT 46.4  MCV 93.0  PLT 389   Basic Metabolic Panel: Recent Labs  Lab 11/11/18 2050  NA 127*  K 3.1*  CL 91*  CO2 21*  GLUCOSE 191*  BUN 5*  CREATININE 0.58*  CALCIUM 9.0   GFR: Estimated Creatinine Clearance: 137.6 mL/min (A) (by C-G formula based on SCr of 0.58 mg/dL (L)). Liver Function Tests: Recent Labs  Lab 11/11/18 2050  AST 88*   ALT 169*  ALKPHOS 87  BILITOT 2.4*  PROT 7.5  ALBUMIN 3.7   Recent Labs  Lab 11/11/18 2050  LIPASE 390*   No results for input(s): AMMONIA in the last 168 hours. Coagulation Profile: Recent Labs  Lab 11/11/18 2050  INR 1.1   Cardiac Enzymes: No results for input(s): CKTOTAL, CKMB, CKMBINDEX, TROPONINI in the last 168 hours. BNP (last 3 results) No results for input(s): PROBNP in the last 8760 hours. HbA1C: No results for input(s): HGBA1C in the last 72 hours. CBG: No results for input(s): GLUCAP in the last 168 hours. Lipid Profile: No results for input(s): CHOL, HDL, LDLCALC, TRIG, CHOLHDL, LDLDIRECT in the last 72 hours. Thyroid Function Tests: No results for input(s): TSH, T4TOTAL, FREET4, T3FREE, THYROIDAB in the last 72 hours. Anemia Panel: No results for input(s): VITAMINB12, FOLATE, FERRITIN, TIBC, IRON, RETICCTPCT in the last 72 hours. Urine analysis:    Component Value Date/Time   COLORURINE AMBER (A) 11/11/2018 2048   APPEARANCEUR HAZY (A) 11/11/2018 2048   LABSPEC 1.029 11/11/2018 2048   PHURINE 6.0 11/11/2018 2048   GLUCOSEU NEGATIVE 11/11/2018 2048   HGBUR NEGATIVE 11/11/2018 2048   BILIRUBINUR NEGATIVE 11/11/2018 2048   KETONESUR 5 (A) 11/11/2018 2048   PROTEINUR 30 (A) 11/11/2018 2048   NITRITE NEGATIVE 11/11/2018 2048   LEUKOCYTESUR NEGATIVE 11/11/2018 2048   Sepsis Labs: @LABRCNTIP (procalcitonin:4,lacticidven:4) )No results found for this or any previous visit (from the past 240 hour(s)).   Radiological Exams on Admission: Dg Chest 2 View  Result Date: 11/11/2018 CLINICAL DATA:  Sepsis.  Hypertension. EXAM: CHEST - 2 VIEW COMPARISON:  12/07/2007 FINDINGS: Heart size is mildly enlarged. Mediastinal shadows are normal. No evidence of pneumonia, collapse or effusion. Density in the left upper chest region on the frontal view probably relates to the first rib and, but the possibility of a left upper lobe mass is not excluded. Consider chest CT. No  bone finding otherwise. IMPRESSION: No sign of pneumonia, collapse or effusion. Asymmetric density in the upper left chest on the frontal view. Whereas this could represent density related to the first rib end, lung mass is not excluded and chest CT would be suggested. Electronically Signed   By: Paulina Fusi M.D.   On: 11/11/2018 21:02   Ct Abdomen Pelvis W Contrast  Result Date: 11/11/2018 CLINICAL DATA:  Acute generalized abdominal pain. EXAM: CT ABDOMEN AND PELVIS WITH CONTRAST TECHNIQUE: Multidetector CT imaging of the abdomen and pelvis was performed using the standard protocol following bolus administration of intravenous contrast. CONTRAST:  ISOVUE-300 IOPAMIDOL (ISOVUE-300) INJECTION 61% COMPARISON:  None. FINDINGS: Lower chest: No acute abnormality. Hepatobiliary: No focal liver abnormality is seen. No gallstones, gallbladder wall thickening, or biliary dilatation. Pancreas: Mild inflammatory changes are noted around the pancreas suggesting acute pancreatitis. 4.5 cm fluid collection is noted in the pancreatic head suggesting pseudocyst formation. No  definite ductal dilatation is noted. Spleen: Normal in size without focal abnormality. Adrenals/Urinary Tract: Adrenal glands appear normal. Simple exophytic cyst is seen arising from upper pole of right kidney. No hydronephrosis or renal obstruction is noted. No renal or ureteral calculi are noted. Urinary bladder is unremarkable. Stomach/Bowel: Stomach is within normal limits. Appendix appears normal. No evidence of bowel wall thickening, distention, or inflammatory changes. Vascular/Lymphatic: Aortic atherosclerosis. No enlarged abdominal or pelvic lymph nodes. Reproductive: Prostate is unremarkable. Other: No abdominal wall hernia or abnormality. No abdominopelvic ascites. Musculoskeletal: No acute or significant osseous findings. IMPRESSION: Findings consistent with acute pancreatitis. 4.5 cm fluid collection is noted in the pancreatic head most  consistent with pseudocyst. Aortic Atherosclerosis (ICD10-I70.0). Electronically Signed   By: Lupita RaiderJames  Green Jr M.D.   On: 11/11/2018 12:08     Assessment/Plan Principal Problem:   Acute pancreatitis Active Problems:   Essential hypertension   Elevated LFTs    1. Acute pancreatitis with pseudocyst development -cause of the acute pancreatitis is not clear.  Patient states he drinks alcohol only on the weekend.  CT scan does not show any evidence of gallstones.  However LFTs elevated.  Will check triglycerides.  Continue with IV fluids n.p.o. pain relief medication consult gastroenterologist in the morning.  Patient has not taken any new medications recently. 2. Abnormal chest x-ray will need CT chest to further evaluate. 3. Hypertension we will keep patient on PRN IV hydralazine when patient is n.p.o. for now. 4. History of hyperlipidemia on statins. 5. History of sleep apnea on CPAP which will be continued once COVID test was negative. 6. Prediabetes we will check hemoglobin A1c.  COVID-19 test is pending.   DVT prophylaxis: SCDs for now. Code Status: Full code. Family Communication: Discussed with patient. Disposition Plan: Home. Consults called: None. Admission status: Observation.   Eduard ClosArshad N Tarrin Lebow MD [facility] Pager [phone].  If 7PM-7AM, please contact night-coverage [email or web address] Password [email or web address]  11/11/2018, 10:49 PM

## 2018-11-11 NOTE — ED Provider Notes (Signed)
[facility] Provider Note   CSN: [id number] Arrival date & time: 11/11/18  2015     History   Chief Complaint Chief Complaint  Patient presents with   Abdominal Pain    HPI Philip Rhodes is a 58 y.o. male presenting for evaluation of pancreatitis.  Patient states that the past 3 days, he has been having intermittent abdominal pain.  Pain is worse at night.  He reports associated nausea without vomiting.  He denies fevers, chills chest pain, cough, urinary symptoms, abnormal bowel movements.  He denies history of previous abdominal problems or abdominal surgeries.  Patient states he was seen by his primary care doctor, there are concerning lab values including leukocytosis and abnormalities of his liver.  CT showed pancreatitis with pancreatic cyst.  As such, it was recommended he come to the ER.  Patient states he has a history of hypertension hyper lipidemia for which he takes medication.  He denies alcohol use.     HPI  Past Medical History:  Diagnosis Date   Hyperlipemia    Hypertension    Sleep apnea     Patient Active Problem List   Diagnosis Date Noted   Acute pancreatitis 11/11/2018   Essential hypertension 11/11/2018   Elevated LFTs 11/11/2018    History reviewed. No pertinent surgical history.      Home Medications    Prior to Admission medications   Medication Sig Start Date End Date Taking? Authorizing Provider  amLODipine (NORVASC) 5 MG tablet Take 5 mg by mouth daily. 11/11/18  Yes [provider]  Ascorbic Acid (VITAMIN C PO) Take 1 tablet by mouth daily.   Yes [provider]  aspirin EC 81 MG tablet Take 81 mg by mouth at bedtime.   Yes [provider]  lisinopril-hydrochlorothiazide (ZESTORETIC) 20-12.5 MG tablet Take 1 tablet by mouth every morning. 11/02/18  Yes [provider]  Multiple Vitamins-Minerals (MULTIVITAMIN ADULT PO) Take 1 tablet by mouth daily.   Yes  [provider]  Omega-3 Fatty Acids (FISH OIL PO) Take 1 tablet by mouth daily.   Yes [provider]  simvastatin (ZOCOR) 40 MG tablet Take 40 mg by mouth every evening. 09/10/18  Yes [provider]  HYDROcodone-acetaminophen (NORCO/VICODIN) 5-325 MG tablet Take 1 tablet by mouth every 6 (six) hours as needed for pain. 11/11/18   [provider]    Family History Family History  Problem Relation Age of Onset   Hypertension Mother     Social History Social History   Tobacco Use   Smoking status: Current Every Day Smoker    Packs/day: 1.00   Smokeless tobacco: Never Used  Substance Use Topics   Alcohol use: Yes    Comment: weekends   Drug use: Not Currently     Allergies   Patient has no known allergies.   Review of Systems Review of Systems  Gastrointestinal: Positive for abdominal pain and nausea.  All other systems reviewed and are negative.    Physical Exam Updated Vital Signs BP (!) 156/75    Pulse 81    Temp 98.7 F (37.1 C) (Oral)    Resp 16    Ht 5\' 8"  (1.727 m)    Wt 136.1 kg    SpO2 97%    BMI 45.61 kg/m   Physical Exam Vitals signs and nursing note reviewed.  Constitutional:      General: He is not in acute distress.    Appearance: He is well-developed.  Comments: Appears nontoxic  HENT:     Head: Normocephalic and atraumatic.  Eyes:     Extraocular Movements: Extraocular movements intact.     Conjunctiva/sclera: Conjunctivae normal.     Pupils: Pupils are equal, round, and reactive to light.  Neck:     Musculoskeletal: Normal range of motion and neck supple.  Cardiovascular:     Rate and Rhythm: Normal rate and regular rhythm.     Pulses: Normal pulses.  Pulmonary:     Effort: Pulmonary effort is normal. No respiratory distress.     Breath sounds: Normal breath sounds. No wheezing.  Abdominal:     General: There is no distension.     Palpations: Abdomen is soft. There is no mass.     Tenderness:  There is no abdominal tenderness. There is no guarding or rebound.     Comments: Obese. No ttp of the abd. No rigidity or guarding. No ttp of the ruq and negative muprhys  Musculoskeletal: Normal range of motion.  Skin:    General: Skin is warm and dry.     Capillary Refill: Capillary refill takes less than 2 seconds.  Neurological:     Mental Status: He is alert and oriented to person, place, and time.      ED Treatments / Results  Labs (all labs ordered are listed, but only abnormal results are displayed) Labs Reviewed  COMPREHENSIVE METABOLIC PANEL - Abnormal; Notable for the following components:      Result Value   Sodium 127 (*)    Potassium 3.1 (*)    Chloride 91 (*)    CO2 21 (*)    Glucose, Bld 191 (*)    BUN 5 (*)    Creatinine, Ser 0.58 (*)    AST 88 (*)    ALT 169 (*)    Total Bilirubin 2.4 (*)    All other components within normal limits  CBC WITH DIFFERENTIAL/PLATELET - Abnormal; Notable for the following components:   WBC 16.9 (*)    Neutro Abs 12.1 (*)    Monocytes Absolute 1.5 (*)    Abs Immature Granulocytes 0.10 (*)    All other components within normal limits  URINALYSIS, ROUTINE W REFLEX MICROSCOPIC - Abnormal; Notable for the following components:   Color, Urine AMBER (*)    APPearance HAZY (*)    Ketones, ur 5 (*)    Protein, ur 30 (*)    All other components within normal limits  LIPASE, BLOOD - Abnormal; Notable for the following components:   Lipase 390 (*)    All other components within normal limits  CULTURE, BLOOD (ROUTINE X 2)  CULTURE, BLOOD (ROUTINE X 2)  SARS CORONAVIRUS 2 (TAT 6-12 HRS)  LACTIC ACID, PLASMA  PROTIME-INR    EKG None  Radiology Dg Chest 2 View  Result Date: 11/11/2018 CLINICAL DATA:  Sepsis.  Hypertension. EXAM: CHEST - 2 VIEW COMPARISON:  12/07/2007 FINDINGS: Heart size is mildly enlarged. Mediastinal shadows are normal. No evidence of pneumonia, collapse or effusion. Density in the left upper chest region on  the frontal view probably relates to the first rib and, but the possibility of a left upper lobe mass is not excluded. Consider chest CT. No bone finding otherwise. IMPRESSION: No sign of pneumonia, collapse or effusion. Asymmetric density in the upper left chest on the frontal view. Whereas this could represent density related to the first rib end, lung mass is not excluded and chest CT would be suggested. Electronically Signed  By: Paulina Fusi M.D.   On: 11/11/2018 21:02   Ct Abdomen Pelvis W Contrast  Result Date: 11/11/2018 CLINICAL DATA:  Acute generalized abdominal pain. EXAM: CT ABDOMEN AND PELVIS WITH CONTRAST TECHNIQUE: Multidetector CT imaging of the abdomen and pelvis was performed using the standard protocol following bolus administration of intravenous contrast. CONTRAST:  ISOVUE-300 IOPAMIDOL (ISOVUE-300) INJECTION 61% COMPARISON:  None. FINDINGS: Lower chest: No acute abnormality. Hepatobiliary: No focal liver abnormality is seen. No gallstones, gallbladder wall thickening, or biliary dilatation. Pancreas: Mild inflammatory changes are noted around the pancreas suggesting acute pancreatitis. 4.5 cm fluid collection is noted in the pancreatic head suggesting pseudocyst formation. No definite ductal dilatation is noted. Spleen: Normal in size without focal abnormality. Adrenals/Urinary Tract: Adrenal glands appear normal. Simple exophytic cyst is seen arising from upper pole of right kidney. No hydronephrosis or renal obstruction is noted. No renal or ureteral calculi are noted. Urinary bladder is unremarkable. Stomach/Bowel: Stomach is within normal limits. Appendix appears normal. No evidence of bowel wall thickening, distention, or inflammatory changes. Vascular/Lymphatic: Aortic atherosclerosis. No enlarged abdominal or pelvic lymph nodes. Reproductive: Prostate is unremarkable. Other: No abdominal wall hernia or abnormality. No abdominopelvic ascites. Musculoskeletal: No acute or  significant osseous findings. IMPRESSION: Findings consistent with acute pancreatitis. 4.5 cm fluid collection is noted in the pancreatic head most consistent with pseudocyst. Aortic Atherosclerosis (ICD10-I70.0). Electronically Signed   By: Lupita Raider M.D.   On: 11/11/2018 12:08    Procedures Procedures (including critical care time)  Medications Ordered in ED Medications  potassium chloride 10 mEq in 100 mL IVPB (has no administration in time range)  0.9 %  sodium chloride infusion (has no administration in time range)  hydrALAZINE (APRESOLINE) injection 10 mg (has no administration in time range)  morphine 2 MG/ML injection 1 mg (has no administration in time range)     Initial Impression / Assessment and Plan / ED Course  I have reviewed the triage vital signs and the nursing notes.  Pertinent labs & imaging results that were available during my care of the patient were reviewed by me and considered in my medical decision making (see chart for details).        Patient presenting for evaluation concern of pancreatitis. Exam reassuring, appears nontoxic. No ttp of the abd. Imaging from today shows pancreatitis with pseudocyst. Will order labs, but will likely need to be admitted.   Labs consistent with pancreatitis. Lipase 390. Slight elevation in bili and lfts. Case discussed with attending, Dr. Clarene Duke evaluated the pt. Pt states he drinks at least a 12-pack a week. Will call for admission .  Discussed with Dr. Toniann Fail from [facility], pt to be admitted.   Final Clinical Impressions(s) / ED Diagnoses   Final diagnoses:  Acute pancreatitis, unspecified complication status, unspecified pancreatitis type    ED Discharge Orders    None       Alveria Apley, PA-C 11/11/18 2320    Little, Ambrose Finland, MD 11/13/18 0930

## 2018-11-11 NOTE — ED Notes (Signed)
Report attempted 

## 2018-11-11 NOTE — ED Notes (Signed)
Pt in xray

## 2018-11-11 NOTE — ED Triage Notes (Signed)
Pt arrives POV for eval of epigastric L sided abd pain. Pt reports today had CT and MD dx'd him w/ acute pancreatitis w/ infection, leukocytosis on CBC, "pocket of fluid" around pancreas on todays CT. Denies hx of ETOH abuse, no ETOH on board today. Appears comfortable in NAD in triage

## 2018-11-11 NOTE — ED Notes (Signed)
ED TO INPATIENT HANDOFF REPORT  ED Nurse Name and Phone #: Lanora Manis [phone]  S Name/Age/Gender Philip Rhodes 58 y.o. male Room/Bed: 031C/031C  Code Status   Code Status: Not on file  Home/SNF/Other Home Patient oriented to: self, place, time and situation Is this baseline? Yes   Triage Complete: Triage complete  Chief Complaint pancreatitis sent by PCP  Triage Note Pt arrives POV for eval of epigastric L sided abd pain. Pt reports today had CT and MD dx'd him w/ acute pancreatitis w/ infection, leukocytosis on CBC, "pocket of fluid" around pancreas on todays CT. Denies hx of ETOH abuse, no ETOH on board today. Appears comfortable in NAD in triage    Allergies No Known Allergies  Level of Care/Admitting Diagnosis ED Disposition    ED Disposition Condition Comment   Admit  Hospital Area: [facility] [[id number]]  Level of Care: Telemetry Medical [104]  I expect the patient will be discharged within 24 hours: No (not a candidate for 5C-Observation unit)  Covid Evaluation: Asymptomatic Screening Protocol (No Symptoms)  Diagnosis: Acute pancreatitis [577.0.ICD-9-CM]  Admitting Physician: Eduard Clos [phone]  Attending Physician: Eduard Clos [phone]  PT Class (Do Not Modify): Observation [104]  PT Acc Code (Do Not Modify): Observation [10022]       B Medical/Surgery History Past Medical History:  Diagnosis Date  . Hyperlipemia   . Hypertension   . Sleep apnea    History reviewed. No pertinent surgical history.   A IV Location/Drains/Wounds Patient Lines/Drains/Airways Status   Active Line/Drains/Airways    Name:   Placement date:   Placement time:   Site:   Days:   Peripheral IV 11/11/18 Left Antecubital   11/11/18    2230    Antecubital   less than 1          Intake/Output Last 24 hours No intake or output data in the 24 hours ending 11/11/18 2300  Labs/Imaging Results for orders placed or performed during the hospital  encounter of 11/11/18 (from the past 48 hour(s))  Urinalysis, Routine w reflex microscopic     Status: Abnormal   Collection Time: 11/11/18  8:48 PM  Result Value Ref Range   Color, Urine AMBER (A) YELLOW    Comment: BIOCHEMICALS MAY BE AFFECTED BY COLOR   APPearance HAZY (A) CLEAR   Specific Gravity, Urine 1.029 1.005 - 1.030   pH 6.0 5.0 - 8.0   Glucose, UA NEGATIVE NEGATIVE mg/dL   Hgb urine dipstick NEGATIVE NEGATIVE   Bilirubin Urine NEGATIVE NEGATIVE   Ketones, ur 5 (A) NEGATIVE mg/dL   Protein, ur 30 (A) NEGATIVE mg/dL   Nitrite NEGATIVE NEGATIVE   Leukocytes,Ua NEGATIVE NEGATIVE   RBC / HPF 0-5 0 - 5 RBC/hpf   WBC, UA 0-5 0 - 5 WBC/hpf   Bacteria, UA NONE SEEN NONE SEEN   Mucus PRESENT    Sperm, UA PRESENT     Comment: Performed at [facility], [address] [place], [place] [address]  Comprehensive metabolic panel     Status: Abnormal   Collection Time: 11/11/18  8:50 PM  Result Value Ref Range   Sodium 127 (L) 135 - 145 mmol/L   Potassium 3.1 (L) 3.5 - 5.1 mmol/L   Chloride 91 (L) 98 - 111 mmol/L   CO2 21 (L) 22 - 32 mmol/L   Glucose, Bld 191 (H) 70 - 99 mg/dL   BUN 5 (L) 6 - 20 mg/dL   Creatinine, Ser 7.40 (  L) 0.61 - 1.24 mg/dL   Calcium 9.0 8.9 - 21.310.3 mg/dL   Total Protein 7.5 6.5 - 8.1 g/dL   Albumin 3.7 3.5 - 5.0 g/dL   AST 88 (H) 15 - 41 U/L   ALT 169 (H) 0 - 44 U/L   Alkaline Phosphatase 87 38 - 126 U/L   Total Bilirubin 2.4 (H) 0.3 - 1.2 mg/dL   GFR calc non Af Amer >60 >60 mL/min   GFR calc Af Amer >60 >60 mL/min   Anion gap 15 5 - 15    Comment: Performed at [facility], [address] [place], [place] [address]  Lactic acid, plasma     Status: None   Collection Time: 11/11/18  8:50 PM  Result Value Ref Range   Lactic Acid, Venous 1.6 0.5 - 1.9 mmol/L    Comment: Performed at [facility], [address] [place], [place] [address]  CBC with Differential     Status: Abnormal   Collection Time: 11/11/18  8:50 PM  Result Value  Ref Range   WBC 16.9 (H) 4.0 - 10.5 K/uL   RBC 4.99 4.22 - 5.81 MIL/uL   Hemoglobin 16.1 13.0 - 17.0 g/dL   HCT 29.546.4 28.439.0 - 13.252.0 %   MCV 93.0 80.0 - 100.0 fL   MCH 32.3 26.0 - 34.0 pg   MCHC 34.7 30.0 - 36.0 g/dL   RDW 44.013.7 10.211.5 - 72.515.5 %   Platelets 389 150 - 400 K/uL   nRBC 0.0 0.0 - 0.2 %   Neutrophils Relative % 71 %   Neutro Abs 12.1 (H) 1.7 - 7.7 K/uL   Lymphocytes Relative 17 %   Lymphs Abs 2.9 0.7 - 4.0 K/uL   Monocytes Relative 9 %   Monocytes Absolute 1.5 (H) 0.1 - 1.0 K/uL   Eosinophils Relative 1 %   Eosinophils Absolute 0.2 0.0 - 0.5 K/uL   Basophils Relative 1 %   Basophils Absolute 0.1 0.0 - 0.1 K/uL   Immature Granulocytes 1 %   Abs Immature Granulocytes 0.10 (H) 0.00 - 0.07 K/uL    Comment: Performed at [facility], [address] [place], [place] [address]  Protime-INR     Status: None   Collection Time: 11/11/18  8:50 PM  Result Value Ref Range   Prothrombin Time 13.7 11.4 - 15.2 seconds   INR 1.1 0.8 - 1.2    Comment: (NOTE) INR goal varies based on device and disease states. Performed at [facility], [address] [place], [place] [address]   Lipase, blood     Status: Abnormal   Collection Time: 11/11/18  8:50 PM  Result Value Ref Range   Lipase 390 (H) 11 - 51 U/L    Comment: Performed at [facility], [address] [place], [place] [address]   Dg Chest 2 View  Result Date: 11/11/2018 CLINICAL DATA:  Sepsis.  Hypertension. EXAM: CHEST - 2 VIEW COMPARISON:  12/07/2007 FINDINGS: Heart size is mildly enlarged. Mediastinal shadows are normal. No evidence of pneumonia, collapse or effusion. Density in the left upper chest region on the frontal view probably relates to the first rib and, but the possibility of a left upper lobe mass is not excluded. Consider chest CT. No bone finding otherwise. IMPRESSION: No sign of pneumonia, collapse or effusion. Asymmetric density in the upper left chest on the frontal view. Whereas this could  represent density related to the first rib end,  lung mass is not excluded and chest CT would be suggested. Electronically Signed   By: Nelson Chimes M.D.   On: 11/11/2018 21:02   Ct Abdomen Pelvis W Contrast  Result Date: 11/11/2018 CLINICAL DATA:  Acute generalized abdominal pain. EXAM: CT ABDOMEN AND PELVIS WITH CONTRAST TECHNIQUE: Multidetector CT imaging of the abdomen and pelvis was performed using the standard protocol following bolus administration of intravenous contrast. CONTRAST:  147mL ISOVUE-300 IOPAMIDOL (ISOVUE-300) INJECTION 61% COMPARISON:  None. FINDINGS: Lower chest: No acute abnormality. Hepatobiliary: No focal liver abnormality is seen. No gallstones, gallbladder wall thickening, or biliary dilatation. Pancreas: Mild inflammatory changes are noted around the pancreas suggesting acute pancreatitis. 4.5 cm fluid collection is noted in the pancreatic head suggesting pseudocyst formation. No definite ductal dilatation is noted. Spleen: Normal in size without focal abnormality. Adrenals/Urinary Tract: Adrenal glands appear normal. Simple exophytic cyst is seen arising from upper pole of right kidney. No hydronephrosis or renal obstruction is noted. No renal or ureteral calculi are noted. Urinary bladder is unremarkable. Stomach/Bowel: Stomach is within normal limits. Appendix appears normal. No evidence of bowel wall thickening, distention, or inflammatory changes. Vascular/Lymphatic: Aortic atherosclerosis. No enlarged abdominal or pelvic lymph nodes. Reproductive: Prostate is unremarkable. Other: No abdominal wall hernia or abnormality. No abdominopelvic ascites. Musculoskeletal: No acute or significant osseous findings. IMPRESSION: Findings consistent with acute pancreatitis. 4.5 cm fluid collection is noted in the pancreatic head most consistent with pseudocyst. Aortic Atherosclerosis (ICD10-I70.0). Electronically Signed   By: Marijo Conception M.D.   On: 11/11/2018 12:08    Pending  Labs Unresulted Labs (From admission, onward)    Start     Ordered   11/11/18 2205  SARS CORONAVIRUS 2 (TAT 6-12 HRS) Nasal Swab Aptima Multi Swab  (Asymptomatic/Tier 2 Patients Labs)  Once,   STAT    Question Answer Comment  Is this test for diagnosis or screening Screening   Symptomatic for COVID-19 as defined by CDC No   Hospitalized for COVID-19 No   Admitted to ICU for COVID-19 No   Previously tested for COVID-19 No   Resident in a congregate (group) care setting No   Employed in healthcare setting No      11/11/18 2205   11/11/18 2027  Culture, blood (Routine x 2)  BLOOD CULTURE X 2,   STAT     11/11/18 2026   Signed and Held  HIV antibody (Routine Testing)  Tomorrow morning,   R     Signed and Held   Signed and Held  Basic metabolic panel  Tomorrow morning,   R     Signed and Held   Signed and Held  Hepatic function panel  Tomorrow morning,   R     Signed and Held   Signed and Held  CBC WITH DIFFERENTIAL  Tomorrow morning,   R     Signed and Held   Signed and Held  Hemoglobin A1c  Tomorrow morning,   R     Signed and Held   Signed and Held  Triglycerides  Tomorrow morning,   R     Signed and Held          Vitals/Pain Today's Vitals   11/11/18 2144 11/11/18 2145 11/11/18 2215 11/11/18 2230  BP:  135/72 116/68 (!) 156/75  Pulse:  78 92 81  Resp:      Temp:      TempSrc:      SpO2:  98% 98% 97%  Weight:  Height:      PainSc: 0-No pain       Isolation Precautions No active isolations  Medications Medications  potassium chloride 10 mEq in 100 mL IVPB (has no administration in time range)  0.9 %  sodium chloride infusion (has no administration in time range)  hydrALAZINE (APRESOLINE) injection 10 mg (has no administration in time range)  morphine 2 MG/ML injection 1 mg (has no administration in time range)    Mobility walks Low fall risk   Focused Assessments    R Recommendations: See Admitting Provider Note  Report given to:   Additional  Notes:

## 2018-11-12 ENCOUNTER — Ambulatory Visit (HOSPITAL_COMMUNITY): Payer: 59 | Attending: Internal Medicine

## 2018-11-12 ENCOUNTER — Other Ambulatory Visit: Payer: Self-pay

## 2018-11-12 ENCOUNTER — Encounter (HOSPITAL_COMMUNITY): Payer: Self-pay

## 2018-11-12 DIAGNOSIS — R945 Abnormal results of liver function studies: Secondary | ICD-10-CM | POA: Diagnosis not present

## 2018-11-12 DIAGNOSIS — R7303 Prediabetes: Secondary | ICD-10-CM | POA: Diagnosis present

## 2018-11-12 DIAGNOSIS — E785 Hyperlipidemia, unspecified: Secondary | ICD-10-CM | POA: Diagnosis present

## 2018-11-12 DIAGNOSIS — Z6841 Body Mass Index (BMI) 40.0 and over, adult: Secondary | ICD-10-CM | POA: Diagnosis not present

## 2018-11-12 DIAGNOSIS — I10 Essential (primary) hypertension: Secondary | ICD-10-CM | POA: Diagnosis present

## 2018-11-12 DIAGNOSIS — K859 Acute pancreatitis without necrosis or infection, unspecified: Secondary | ICD-10-CM | POA: Diagnosis present

## 2018-11-12 DIAGNOSIS — K8592 Acute pancreatitis with infected necrosis, unspecified: Secondary | ICD-10-CM | POA: Diagnosis not present

## 2018-11-12 DIAGNOSIS — K760 Fatty (change of) liver, not elsewhere classified: Secondary | ICD-10-CM | POA: Diagnosis present

## 2018-11-12 DIAGNOSIS — G4730 Sleep apnea, unspecified: Secondary | ICD-10-CM | POA: Diagnosis present

## 2018-11-12 DIAGNOSIS — Z20828 Contact with and (suspected) exposure to other viral communicable diseases: Secondary | ICD-10-CM | POA: Diagnosis present

## 2018-11-12 DIAGNOSIS — Z8249 Family history of ischemic heart disease and other diseases of the circulatory system: Secondary | ICD-10-CM | POA: Diagnosis not present

## 2018-11-12 DIAGNOSIS — F17210 Nicotine dependence, cigarettes, uncomplicated: Secondary | ICD-10-CM | POA: Diagnosis present

## 2018-11-12 DIAGNOSIS — K852 Alcohol induced acute pancreatitis without necrosis or infection: Secondary | ICD-10-CM | POA: Diagnosis present

## 2018-11-12 DIAGNOSIS — K7010 Alcoholic hepatitis without ascites: Secondary | ICD-10-CM | POA: Diagnosis present

## 2018-11-12 DIAGNOSIS — K863 Pseudocyst of pancreas: Secondary | ICD-10-CM | POA: Diagnosis present

## 2018-11-12 DIAGNOSIS — Z79899 Other long term (current) drug therapy: Secondary | ICD-10-CM | POA: Diagnosis not present

## 2018-11-12 DIAGNOSIS — Z7982 Long term (current) use of aspirin: Secondary | ICD-10-CM | POA: Diagnosis not present

## 2018-11-12 LAB — HEPATIC FUNCTION PANEL
ALT: 202 U/L — ABNORMAL HIGH (ref 0–44)
AST: 134 U/L — ABNORMAL HIGH (ref 15–41)
Albumin: 3.2 g/dL — ABNORMAL LOW (ref 3.5–5.0)
Alkaline Phosphatase: 85 U/L (ref 38–126)
Bilirubin, Direct: 2.7 mg/dL — ABNORMAL HIGH (ref 0.0–0.2)
Indirect Bilirubin: 1.5 mg/dL — ABNORMAL HIGH (ref 0.3–0.9)
Total Bilirubin: 4.2 mg/dL — ABNORMAL HIGH (ref 0.3–1.2)
Total Protein: 6.7 g/dL (ref 6.5–8.1)

## 2018-11-12 LAB — CBC WITH DIFFERENTIAL/PLATELET
Abs Immature Granulocytes: 0.1 10*3/uL — ABNORMAL HIGH (ref 0.00–0.07)
Basophils Absolute: 0.1 10*3/uL (ref 0.0–0.1)
Basophils Relative: 1 %
Eosinophils Absolute: 0.2 10*3/uL (ref 0.0–0.5)
Eosinophils Relative: 1 %
HCT: 42.6 % (ref 39.0–52.0)
Hemoglobin: 14.8 g/dL (ref 13.0–17.0)
Immature Granulocytes: 1 %
Lymphocytes Relative: 16 %
Lymphs Abs: 2.5 10*3/uL (ref 0.7–4.0)
MCH: 32 pg (ref 26.0–34.0)
MCHC: 34.7 g/dL (ref 30.0–36.0)
MCV: 92.2 fL (ref 80.0–100.0)
Monocytes Absolute: 1.5 10*3/uL — ABNORMAL HIGH (ref 0.1–1.0)
Monocytes Relative: 9 %
Neutro Abs: 11.7 10*3/uL — ABNORMAL HIGH (ref 1.7–7.7)
Neutrophils Relative %: 72 %
Platelets: 353 10*3/uL (ref 150–400)
RBC: 4.62 MIL/uL (ref 4.22–5.81)
RDW: 13.6 % (ref 11.5–15.5)
WBC: 16.1 10*3/uL — ABNORMAL HIGH (ref 4.0–10.5)
nRBC: 0 % (ref 0.0–0.2)

## 2018-11-12 LAB — SARS CORONAVIRUS 2 (TAT 6-24 HRS): SARS Coronavirus 2: NEGATIVE

## 2018-11-12 LAB — HEMOGLOBIN A1C
Hgb A1c MFr Bld: 6.2 % — ABNORMAL HIGH (ref 4.8–5.6)
Mean Plasma Glucose: 131.24 mg/dL

## 2018-11-12 LAB — HIV ANTIBODY (ROUTINE TESTING W REFLEX): HIV Screen 4th Generation wRfx: NONREACTIVE

## 2018-11-12 LAB — LIPASE, BLOOD: Lipase: 301 U/L — ABNORMAL HIGH (ref 11–51)

## 2018-11-12 LAB — TRIGLYCERIDES: Triglycerides: 71 mg/dL (ref <150)

## 2018-11-12 IMAGING — US ULTRASOUND ABDOMEN LIMITED
1 series · 14 of 25 positions shown · non-contrast
Comparison: [DATE] CT

CLINICAL DATA: 57-year-old male with abdominal pain and
pancreatitis.

EXAM:
ULTRASOUND ABDOMEN LIMITED RIGHT UPPER QUADRANT

[Series 1: ultrasound abdomen limited · 14 of 44 slices shown]
[im 1/44]
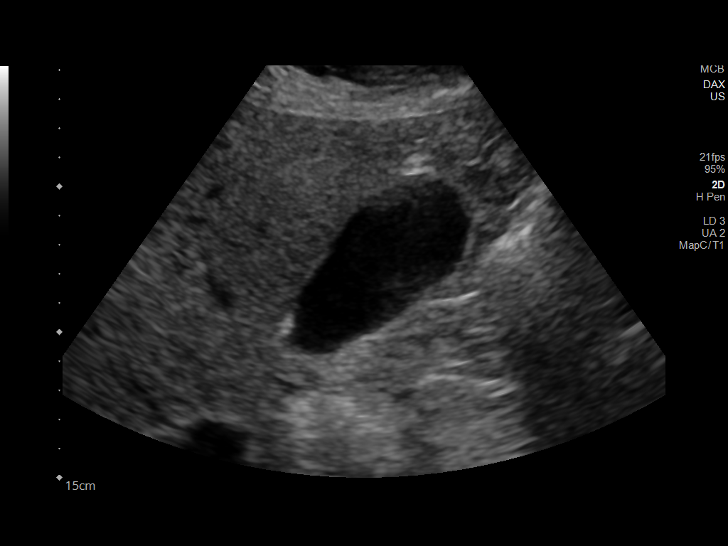
[im 4/44]
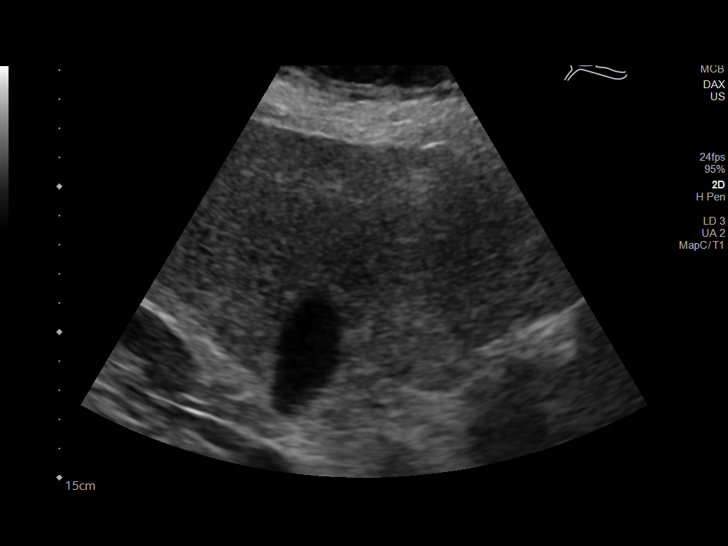
[im 8/44]
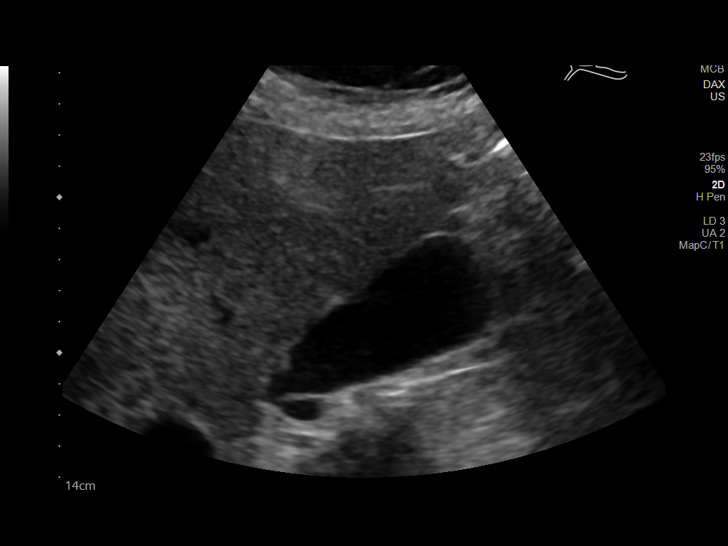
[im 11/44]
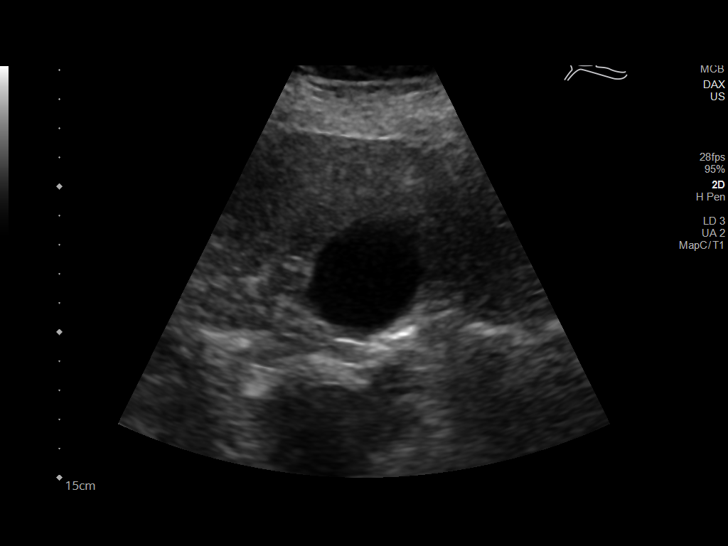
[im 15/44]
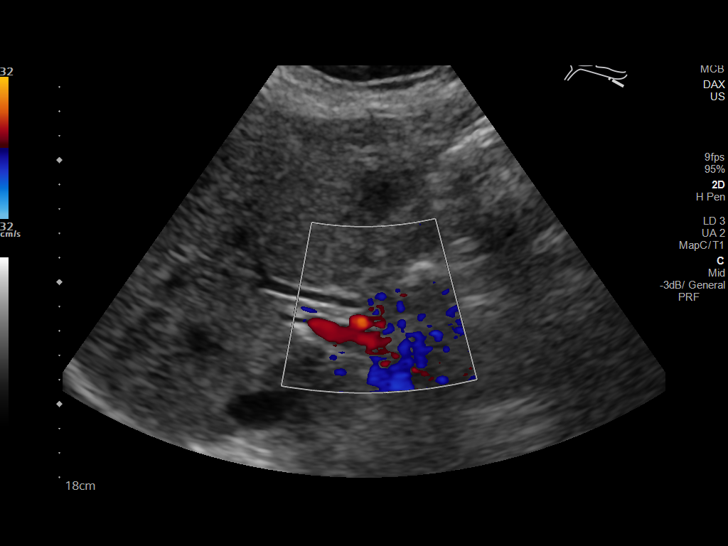
[im 17/44]
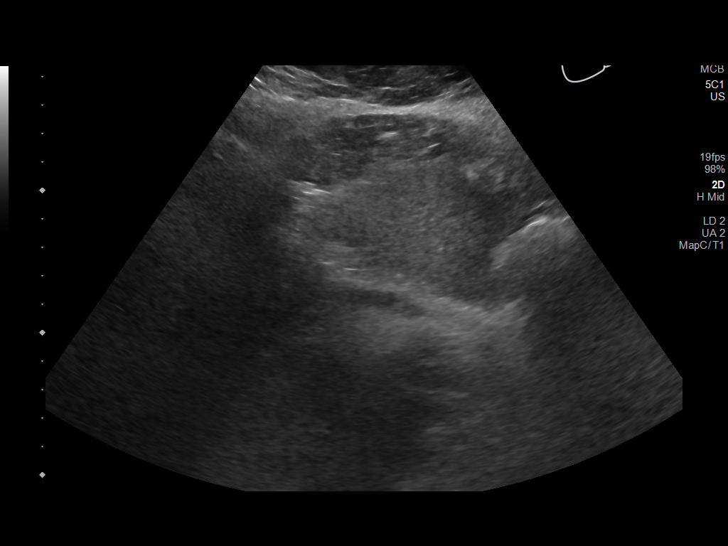
[im 20/44]
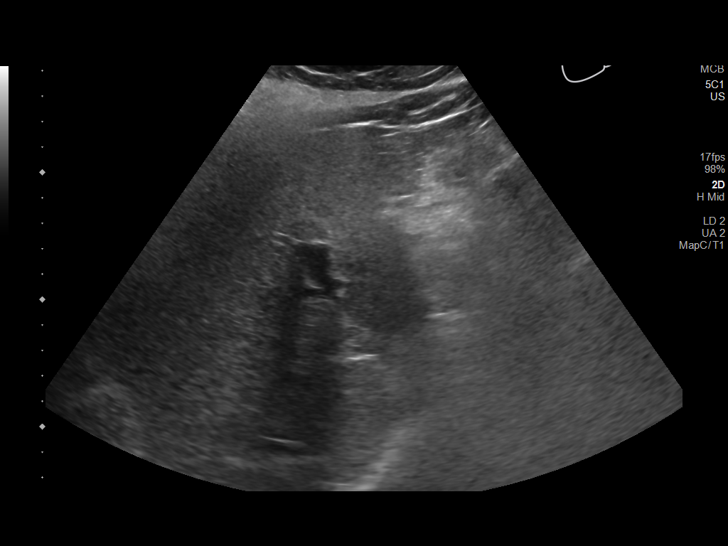
[im 24/44]
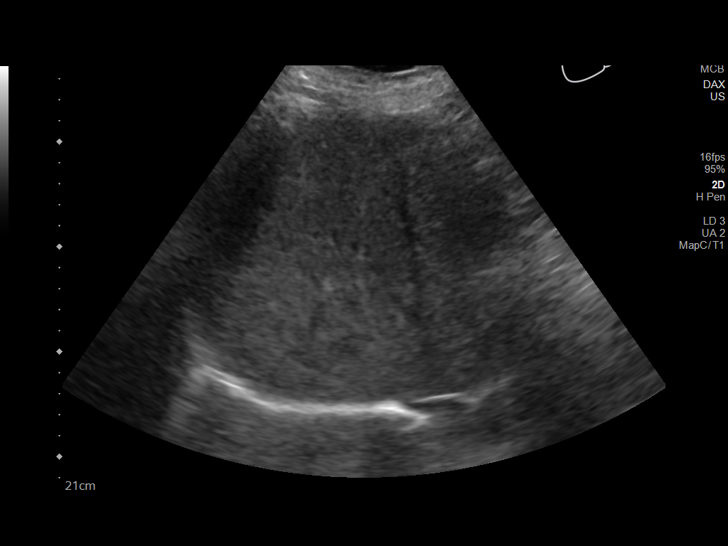
[im 27/44]
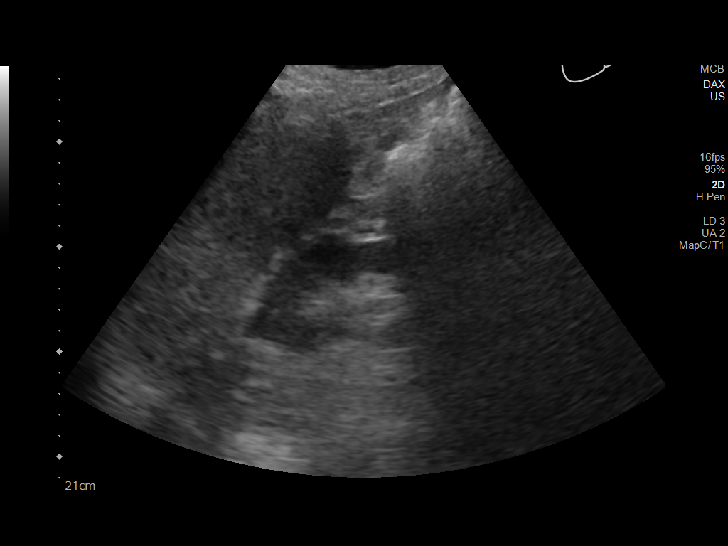
[im 29/44]
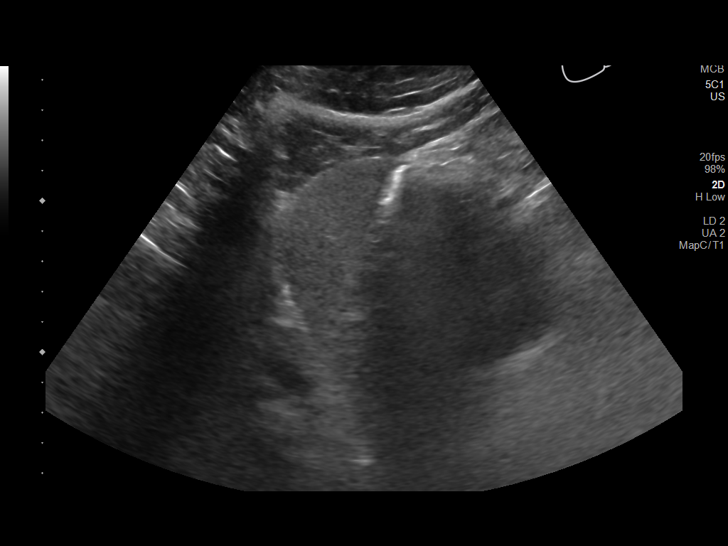
[im 33/44]
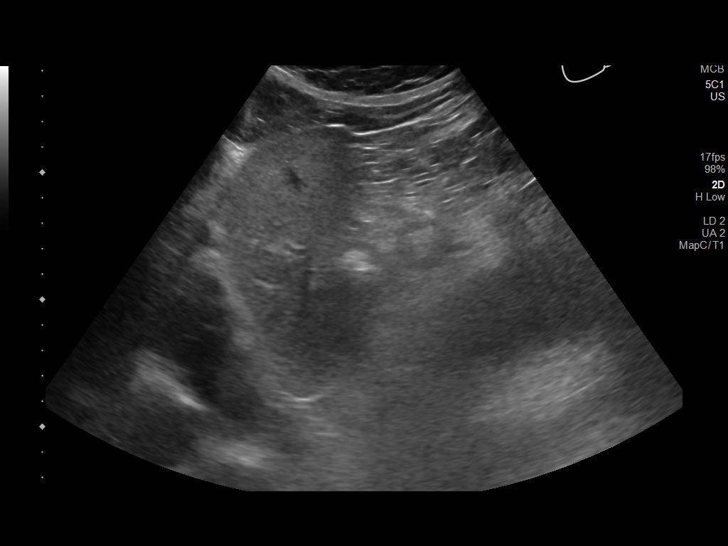
[im 36/44]
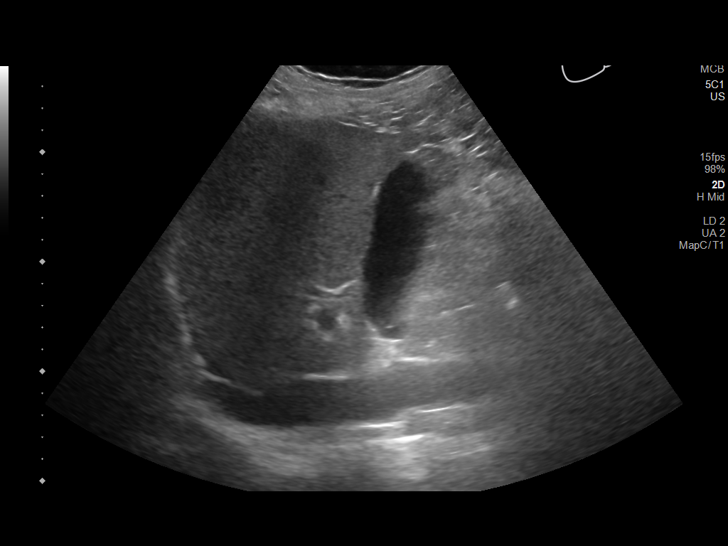
[im 40/44]
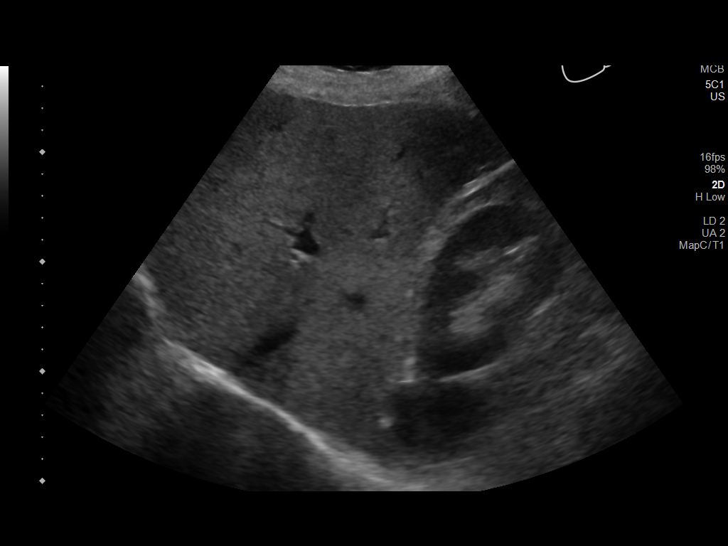
[im 44/44]
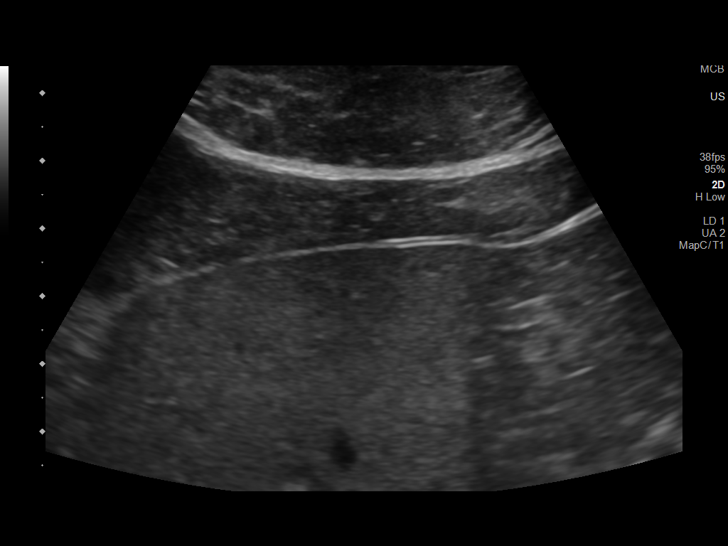

[14 of 25 positions shown; findings below may reference images not displayed]

FINDINGS: Gallbladder:

The gallbladder is unremarkable. There is no evidence of
cholelithiasis or acute cholecystitis.

Common bile duct:

Diameter: 3.5 mm. No intrahepatic or extrahepatic biliary dilatation
identified.

Liver:

No focal abnormalities are identified. UPPER limits of normal
hepatic echogenicity may represent mild hepatic steatosis. Portal
vein is patent on color Doppler imaging with normal direction of
blood flow towards the liver.

Other: None.
IMPRESSION: 1. No biliary dilatation.
2. Unremarkable gallbladder. No evidence of cholelithiasis or acute
cholecystitis.
3. Possible mild hepatic steatosis.

## 2018-11-12 MED ORDER — PIPERACILLIN-TAZOBACTAM 3.375 G IVPB
3.3750 g | Freq: Three times a day (TID) | INTRAVENOUS | Status: DC
  Administered 2018-11-12 – 2018-11-14 (×6): 3.375 g via INTRAVENOUS
  Filled 2018-11-12 (×5): qty 50

## 2018-11-12 MED ORDER — PIPERACILLIN-TAZOBACTAM 3.375 G IVPB 30 MIN: 3.3750 g | Freq: Three times a day (TID) | INTRAVENOUS | Status: DC

## 2018-11-12 NOTE — Progress Notes (Signed)
PROGRESS NOTE  Philip FinesCraig Patnaude WJX:[id number] DOB: [birth year] DOA: 11/11/2018 PCP: Patient, No Pcp Per  Brief History   Philip Rhodes is a 58 y.o. male with history of hypertension, hyperlipidemia, sleep apnea has been experiencing abdominal pain mostly in the epigastric area for last 2 days with no associated nausea vomiting or diarrhea.  Eyes any chest pain shortness of breath fever or chills.  Due to persistent pain patient had gone to his primary care physician who ordered CT abdomen pelvis which shows features concerning for acute pancreatitis with pseudocyst formation.  Patient states he only drinks alcohol on the weekends.   ED Course: In the ER patient was given pain relief medication and IV fluids.  Labs revealed sodium 127 potassium 3.1 glucose 191.  Creatinine is 9 hemoglobin 16 hematocrit 46.4 WBC count 16.9.  Lipase was 390 AST 88 ALT 169 total bilirubin 2.4.  CT scan did not show any gallbladder stones.  Patient admitted for further management of acute pancreatitis.  Chest x-ray was abnormal for which they have suggested CT chest.  The patient was admittd to a medical bed. He was kept NPO. Rt upper quadrant US was unremarkable.   Consultants  . None  Procedures  . None  Antibiotics   Anti-infectives (From admission, onward)   Start     Dose/Rate Route Frequency Ordered Stop   11/12/18 1400  piperacillin-tazobactam (ZOSYN) IVPB 3.375 g  Status:  Discontinued     3.375 g 100 mL/hr over 30 Minutes Intravenous Every 8 hours 11/12/18 1151 11/12/18 1227   11/12/18 1400  piperacillin-tazobactam (ZOSYN) IVPB 3.375 g     3.375 g 12.5 mL/hr over 240 Minutes Intravenous Every 8 hours 11/12/18 1227      .  Marland Kitchen.   Subjective  The patient is sitting up in bed. He states that he is feeling better. No new complaints.  Objective   Vitals:  Vitals:   11/12/18 0430 11/12/18 1438  BP: (!) 156/84 127/60  Pulse: 77 66  Resp: 20 18  Temp: 99 F (37.2 C) 99.5 F (37.5 C)  SpO2:  98% 98%    Exam:  Constitutional:  . The patient is awake and alert and oriented x 3. He is in no acute distress. Respiratory:  . There is no increased work of breathing. . No wheezes, rales, or rhonchi. . No tactile fremitus. Marland Kitchen. Respiratory effort normal. No retractions or accessory muscle use Cardiovascular:  . RRR, no m/r/g . No LE extremity edema   . Normal pedal pulses Abdomen:  . Abdomen is soft, non-tender, non-distended . No hernias, masses, or organomegaly. . Normoactive bowel sounds.  Musculoskeletal:  . No cyanosis, clubbing, or edema Skin:  . No rashes, lesions, ulcers . palpation of skin: no induration or nodules Neurologic:  . CN 2-12 intact . Sensation all 4 extremities intact Psychiatric:  . Mental status o Mood, affect appropriate o Orientation to person, place, time  . judgment and insight appear intact     I have personally reviewed the following:   Today's Data  . Vitals, CBC, CMP Imaging  . CXR: Abnormal opacity in right upper lobe. CT chest recommended . Right upper quadrant ultrasound. Negative for cholecystitis or CBD dilatation. Hepatic steatosis.  Scheduled Meds: Continuous Infusions: . sodium chloride 150 mL/hr at 11/12/18 1233  . piperacillin-tazobactam (ZOSYN)  IV 3.375 g (11/12/18 1244)    Principal Problem:   Acute pancreatitis Active Problems:   Essential hypertension   Elevated LFTs   Pancreatitis  LOS: 0 days   A & P  Pancreatitis: Etiology unclear. The patient does occasionally drink heavily. He is also on hydrochlorothiazide which may cause pancreatitis. Monitor lipase. Pain seems to be improved. Will advance diet.  Elevated LFT's: Bilirubin is increased this morning. Right upper quadrant does not demonstrate CBD dilatation. Liver appearance is consistent with hepatic steatosis. Monitor and consider consult GI if increased again in the am. 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  Essential Hypertension: Blood pressure is  well controlled with out any antihypertensives. His amlodipine and lisinopril/HCTZ have been held. It is quite possible that HCTZ may have played a role in his pancreatitis.  Hyperlipidemia: Zocor has been held.   Abnormal finding on CXR. Recommendation is for follow up CT chest to further investigate.  DVT prophylaxis: SCD's Code Status: Full Code Family Communication: Patient's wife was at bedside Disposition Plan: Home   Kaylise Blakeley, DO [facility] Direct contact: see [email or web address]  7PM-7AM contact night coverage as above 11/12/2018, 6:44 PM  LOS: 0 days

## 2018-11-12 NOTE — Progress Notes (Signed)
Pt arrived via gurney ambulated to bed. C/O mild abdominal pain and nausea. No emesis; telemetry applied. Pt has IVF's infusing. Alert and oriented times four. Skin intact. Questions answered orders and plan of care reviewed with patient

## 2018-11-13 ENCOUNTER — Inpatient Hospital Stay (HOSPITAL_COMMUNITY): Payer: 59

## 2018-11-13 DIAGNOSIS — K863 Pseudocyst of pancreas: Secondary | ICD-10-CM

## 2018-11-13 LAB — CBC WITH DIFFERENTIAL/PLATELET
Abs Immature Granulocytes: 0.08 10*3/uL — ABNORMAL HIGH (ref 0.00–0.07)
Basophils Absolute: 0.1 10*3/uL (ref 0.0–0.1)
Basophils Relative: 1 %
Eosinophils Absolute: 0.3 10*3/uL (ref 0.0–0.5)
Eosinophils Relative: 2 %
HCT: 40.1 % (ref 39.0–52.0)
Hemoglobin: 13.8 g/dL (ref 13.0–17.0)
Immature Granulocytes: 1 %
Lymphocytes Relative: 14 %
Lymphs Abs: 1.8 10*3/uL (ref 0.7–4.0)
MCH: 32.4 pg (ref 26.0–34.0)
MCHC: 34.4 g/dL (ref 30.0–36.0)
MCV: 94.1 fL (ref 80.0–100.0)
Monocytes Absolute: 1.3 10*3/uL — ABNORMAL HIGH (ref 0.1–1.0)
Monocytes Relative: 10 %
Neutro Abs: 9.3 10*3/uL — ABNORMAL HIGH (ref 1.7–7.7)
Neutrophils Relative %: 72 %
Platelets: 315 10*3/uL (ref 150–400)
RBC: 4.26 MIL/uL (ref 4.22–5.81)
RDW: 13.8 % (ref 11.5–15.5)
WBC: 12.9 10*3/uL — ABNORMAL HIGH (ref 4.0–10.5)
nRBC: 0 % (ref 0.0–0.2)

## 2018-11-13 LAB — COMPREHENSIVE METABOLIC PANEL
ALT: 238 U/L — ABNORMAL HIGH (ref 0–44)
AST: 154 U/L — ABNORMAL HIGH (ref 15–41)
Albumin: 3.1 g/dL — ABNORMAL LOW (ref 3.5–5.0)
Alkaline Phosphatase: 90 U/L (ref 38–126)
Anion gap: 12 (ref 5–15)
BUN: <5 mg/dL — ABNORMAL LOW (ref 6–20)
CO2: 22 mmol/L (ref 22–32)
Calcium: 8.2 mg/dL — ABNORMAL LOW (ref 8.9–10.3)
Chloride: 96 mmol/L — ABNORMAL LOW (ref 98–111)
Creatinine, Ser: 0.59 mg/dL — ABNORMAL LOW (ref 0.61–1.24)
GFR calc Af Amer: >60 mL/min (ref >60)
GFR calc non Af Amer: >60 mL/min (ref >60)
Glucose, Bld: 122 mg/dL — ABNORMAL HIGH (ref 70–99)
Potassium: 3.5 mmol/L (ref 3.5–5.1)
Sodium: 130 mmol/L — ABNORMAL LOW (ref 135–145)
Total Bilirubin: 7.9 mg/dL — ABNORMAL HIGH (ref 0.3–1.2)
Total Protein: 6.6 g/dL (ref 6.5–8.1)

## 2018-11-13 LAB — LIPASE, BLOOD: Lipase: 297 U/L — ABNORMAL HIGH (ref 11–51)

## 2018-11-13 IMAGING — MR MR ABDOMEN WO/W CM MRCP
18 of 20 series · 45 of 48 positions shown · IV contrast (Gadavist)
Comparison: [DATE] CT abdomen/pelvis.

CLINICAL DATA: Inpatient. Acute pancreatitis, first episode,
uncertain etiology. Follow-up cystic pancreatic lesion.

EXAM:
MRI ABDOMEN WITHOUT AND WITH CONTRAST (INCLUDING MRCP)
TECHNIQUE: Multiplanar multisequence MR imaging of the abdomen was performed
both before and after the administration of intravenous contrast.
Heavily T2-weighted images of the biliary and pancreatic ducts were
obtained, and three-dimensional MRCP images were rendered by post
processing.
CONTRAST:  10 cc Gadavist IV.

[Series 4: ax haste · axial · 6.0mm · 1.31mm/px · 1 of 45 slices shown]
[im 1/45]
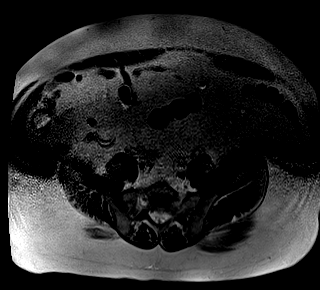

[Series 5: bSSFP · coronal · 6.0mm · 0.82mm/px · 1 of 47 slices shown]
[im 1/47]
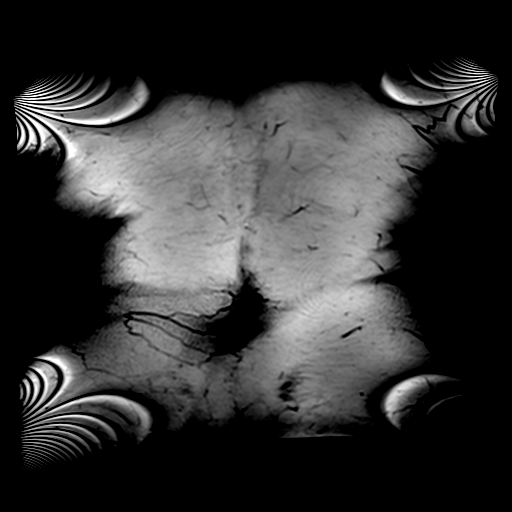

[Series 6: T2 fat-sat · axial · 6.0mm · 1.31mm/px · 1 of 45 slices shown]
[im 1/45]
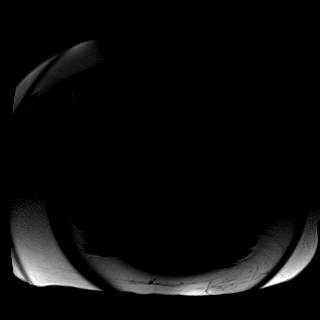

[Series 7: ax in and · axial · 3.0mm · 1.31mm/px · z∈[-201,+83]mm · 5 of 192 slices shown]
[im 1/192]
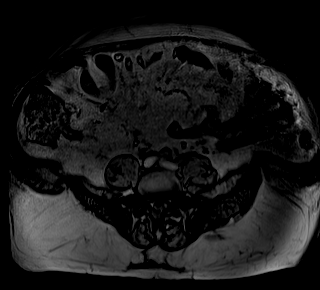
[im 48/192]
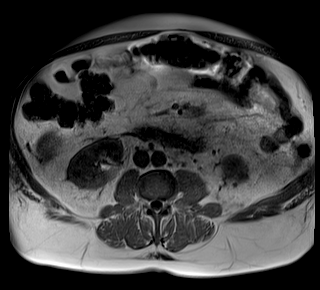
[im 96/192]
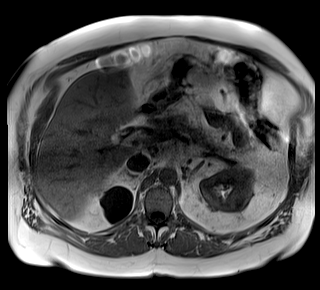
[im 144/192]
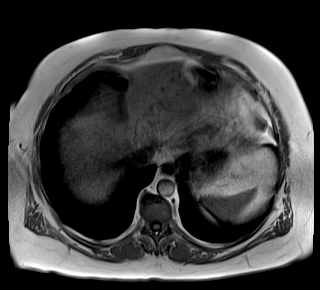
[im 192/192]
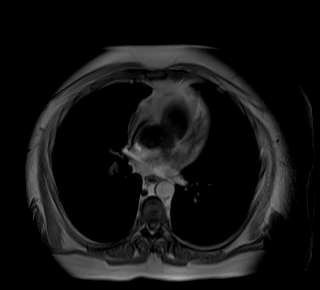

[Series 8: DWI · axial · 6.0mm · 1.57mm/px · z∈[-250,+109]mm · 4 of 153 slices shown (1 of 2)]
[im 1/153]
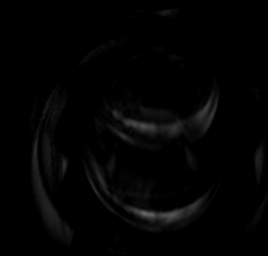
[im 51/153]
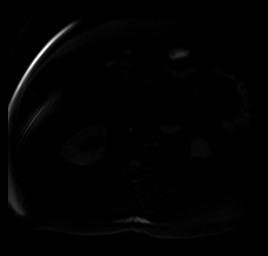
[im 102/153]
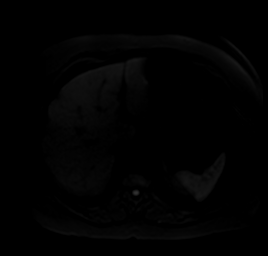
[im 153/153]
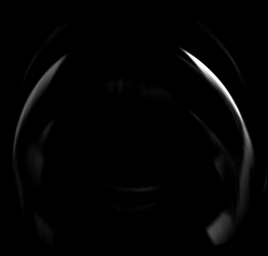

[Series 9: DWI · axial · 6.0mm · 1.57mm/px · 1 of 51 slices shown (2 of 2)]
[im 1/51]
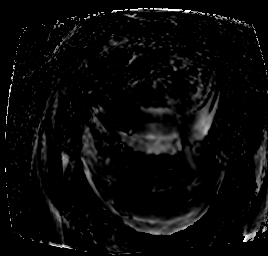

[Series 14: MRCP · coronal · 4.0mm · 1.12mm/px · 1 of 21 slices shown]
[im 1/21]
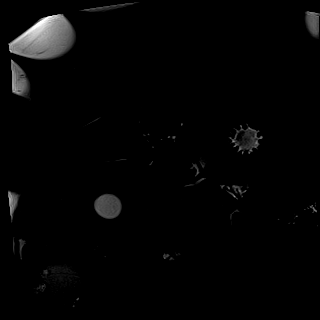

[Series 15: radials · oblique · 50.0mm · 0.78mm/px · 1 of 5 slices shown]
[im 1/5]
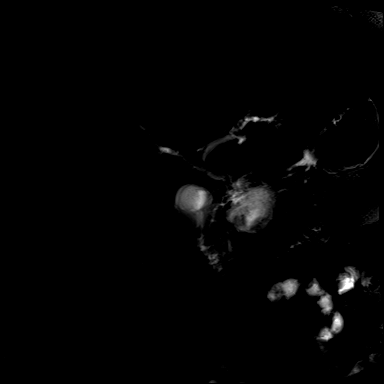

[Series 16: T1 dynamic · axial · non-contrast · 3.0mm · 1.31mm/px · z∈[-201,+83]mm · 3 of 96 slices shown (1 of 5)]
[im 1/96]
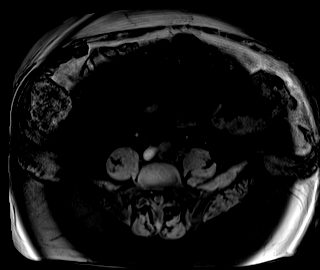
[im 48/96]
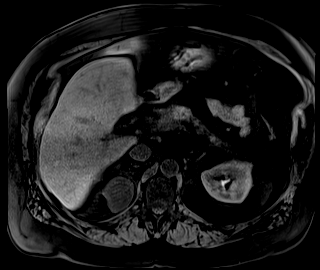
[im 96/96]
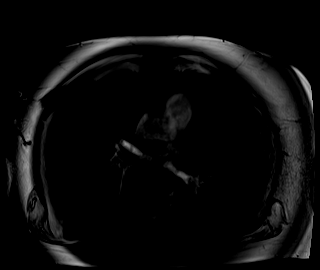

[Series 18: T1 dynamic post-contrast · axial · 3.0mm · 1.31mm/px · z∈[-201,+83]mm · 3 of 96 slices shown (1 of 5)]
[im 1/96]
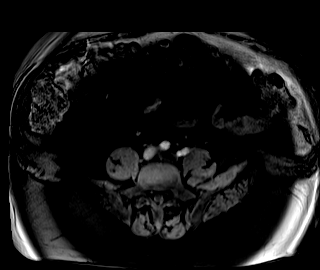
[im 48/96]
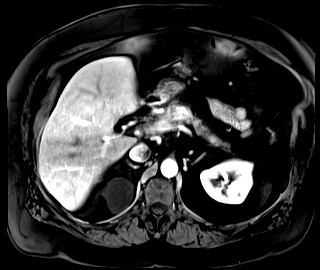
[im 96/96]
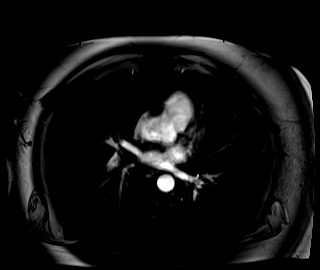

[Series 19: T1 dynamic · axial · 3.0mm · 1.31mm/px · z∈[-201,+83]mm · 3 of 96 slices shown (2 of 5)]
[im 1/96]
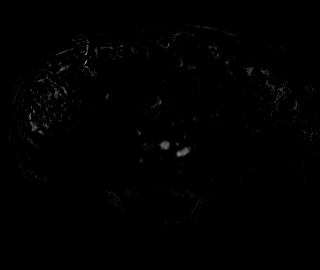
[im 48/96]
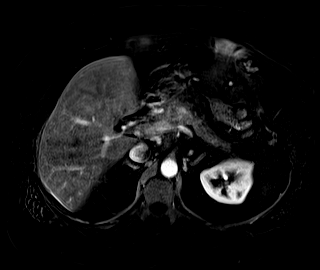
[im 96/96]
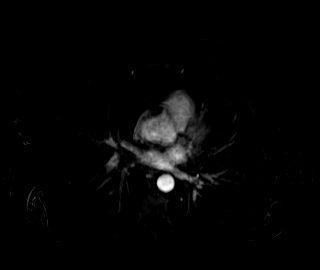

[Series 20: T1 dynamic post-contrast · axial · 3.0mm · 1.31mm/px · z∈[-201,+83]mm · 3 of 96 slices shown (2 of 5)]
[im 1/96]
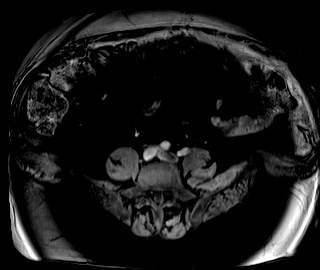
[im 48/96]
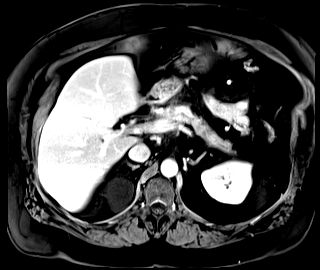
[im 96/96]
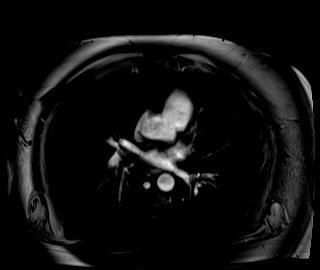

[Series 21: T1 dynamic · axial · 3.0mm · 1.31mm/px · z∈[-201,+83]mm · 3 of 96 slices shown (3 of 5)]
[im 1/96]
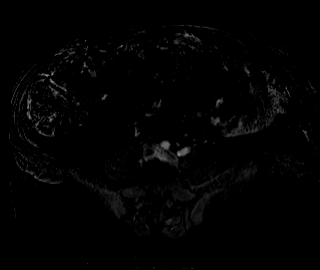
[im 48/96]
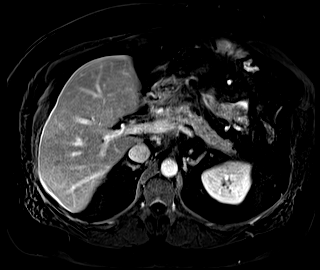
[im 96/96]
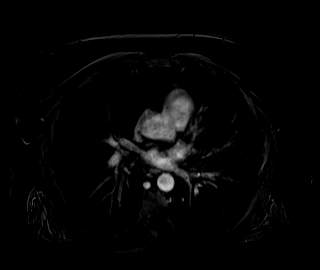

[Series 22: T1 dynamic post-contrast · axial · 3.0mm · 1.31mm/px · z∈[-201,+83]mm · 3 of 96 slices shown (3 of 5)]
[im 1/96]
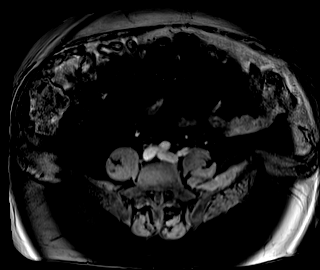
[im 48/96]
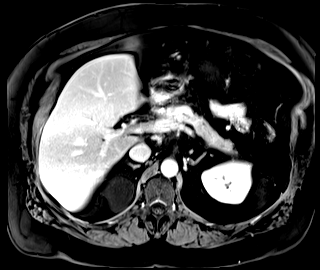
[im 96/96]
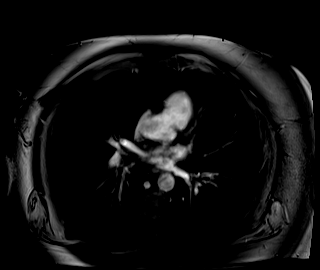

[Series 23: T1 dynamic · axial · 3.0mm · 1.31mm/px · z∈[-201,+83]mm · 3 of 96 slices shown (4 of 5)]
[im 1/96]
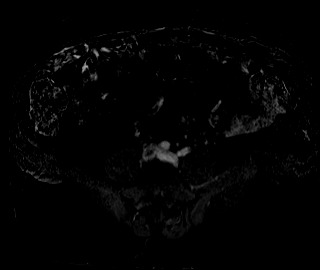
[im 48/96]
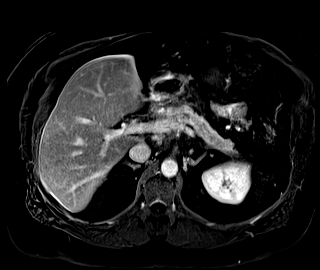
[im 96/96]
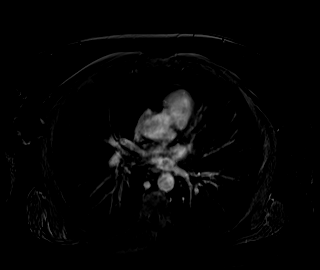

[Series 24: T1 dynamic post-contrast · axial · 3.0mm · 1.31mm/px · z∈[-201,+83]mm · 3 of 96 slices shown (4 of 5)]
[im 1/96]
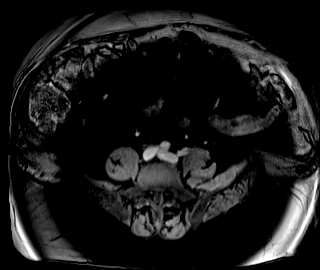
[im 48/96]
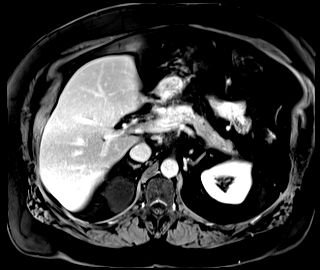
[im 96/96]
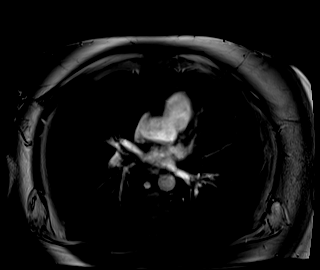

[Series 25: T1 dynamic · axial · 3.0mm · 1.31mm/px · z∈[-201,+83]mm · 3 of 96 slices shown (5 of 5)]
[im 1/96]
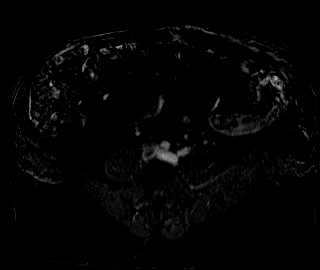
[im 48/96]
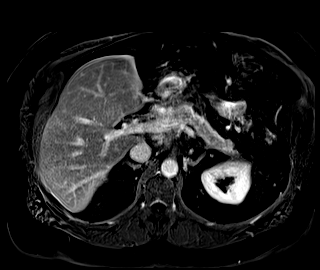
[im 96/96]
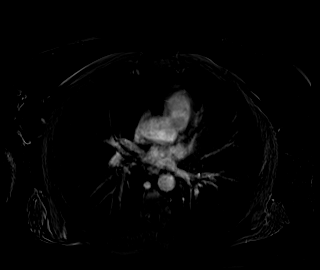

[Series 26: T1 dynamic post-contrast · coronal · 3.0mm · 1.31mm/px · 3 of 96 slices shown (5 of 5)]
[im 1/96]
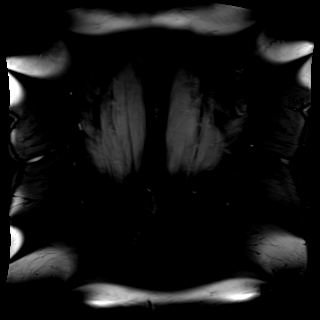
[im 48/96]
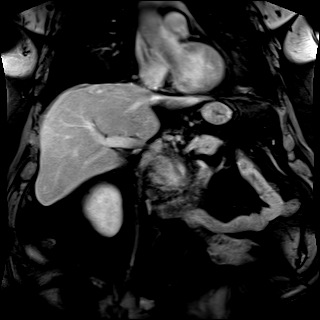
[im 96/96]
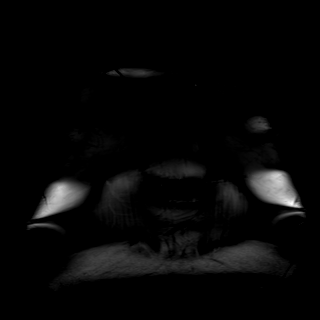

[45 of 48 positions shown; findings below may reference images not displayed]

FINDINGS: Lower chest: No acute abnormality at the lung bases.

Hepatobiliary: Normal liver size and configuration. Mild diffuse
hepatic steatosis. There is a mildly T2 hyperintense 1.4 cm liver
lesion in the posterior segment 2 left liver lobe (series 6/image
10), which demonstrates hyperenhancement on all post-contrast
sequences, compatible with flash filling hemangioma. No additional
liver lesions. Normal gallbladder with no cholelithiasis. Minimal
intrahepatic biliary ductal dilatation. Common bile duct diameter 4
mm. No evidence of choledocholithiasis. Smooth extrinsic mass-effect
on the intrapancreatic portion of the distal CBD.

Pancreas: There is diffuse pancreatic parenchymal and peripancreatic
edema, most prominent in the pancreatic head extending into the
mesenteric root and minimally into the anterior and posterior left
paranephric spaces, compatible with acute pancreatitis. Pancreatic
parenchymal enhancement is preserved. There is a 4.4 x 3.7 x 4.0 cm
complex cystic lesion in the pancreatic head (series 4/image 25),
which demonstrates mildly irregular contour and no internal
enhancement or significant wall thickening, including absence of
enhancement within the nodular T1 hyperintense T2 hypointense
eccentric hemorrhagic/proteinaceous debris at the anterior inferior
margin of the cyst (series 16/image 61), most compatible with
pancreatic pseudocyst, which measured 4.5 x 3.8 x 4.0 cm on
[DATE] CT, not appreciably changed. No additional focal
pancreatic lesions. No peripancreatic fluid collections. Difficult
to assess for pancreas divisum given mass-effect on distal common
bile duct and ventral and dorsal pancreatic ducts. No pancreatic
duct dilation.

Spleen: Normal size. No mass.

Adrenals/Urinary Tract: Normal adrenals. No hydronephrosis. Simple
exophytic 5.3 cm upper right renal cyst. No suspicious renal masses.

Stomach/Bowel: Normal non-distended stomach. Visualized small and
large bowel is normal caliber, with no bowel wall thickening.

Vascular/Lymphatic: Atherosclerotic nonaneurysmal abdominal aorta.
Patent portal, splenic, hepatic and renal veins. Stable extrinsic
mass-effect on confluence of the SMV, main portal vein and splenic
veins, which remain patent and without thrombosis. No pathologically
enlarged lymph nodes in the abdomen.

Other: No abdominal ascites or focal fluid collection.

Musculoskeletal: No aggressive appearing focal osseous lesions.
IMPRESSION: 1. Acute pancreatitis. Complex nonenhancing cystic lesion in the
pancreatic head measures 4.4 x 3.7 x 4.0 cm and demonstrates
eccentric nodular hemorrhagic/proteinaceous debris, most compatible
with a pancreatic pseudocyst, stable in size since recent [DATE]
CT. Normal caliber common bile duct. Minimal intrahepatic biliary
ductal dilatation. No pancreatic duct dilation. Follow-up MRI
abdomen without and with IV contrast recommended in 3 months.
2. Stable extrinsic mass-effect on the portosplenic venous
confluence, without venous thrombosis.
3. Mild diffuse hepatic steatosis.
4. No cholelithiasis.  No choledocholithiasis.

## 2018-11-13 IMAGING — CT CT CHEST WITH CONTRAST
2 of 3 series · 15 of 36 positions shown, 18 images · IV contrast (APPLIED)
Comparison: Chest x-ray from yesterday.

CLINICAL DATA: Possible left upper lobe lung mass on chest x-ray.

EXAM:
CT CHEST WITH CONTRAST
TECHNIQUE: Multidetector CT imaging of the chest was performed during
intravenous contrast administration.
CONTRAST:  75mL OMNIPAQUE IOHEXOL 300 MG/ML  SOLN

[Series 3: thorax 2.0 i31f 2 · axial · 0.98mm/px · z∈[+1042,+1332]mm · 12 of 171 slices shown, 15 images]
[im 13/171  mediastinal]
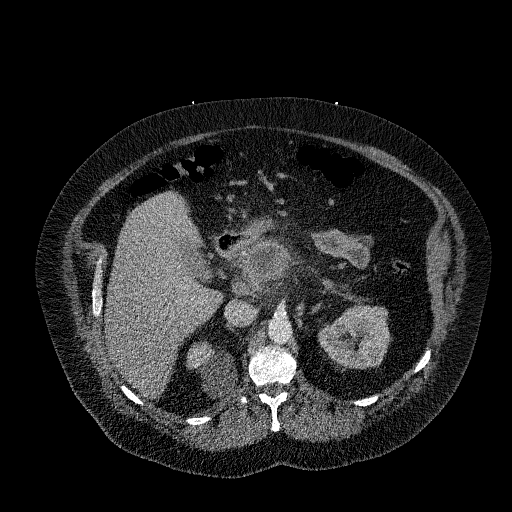
[im 13/171  lung]
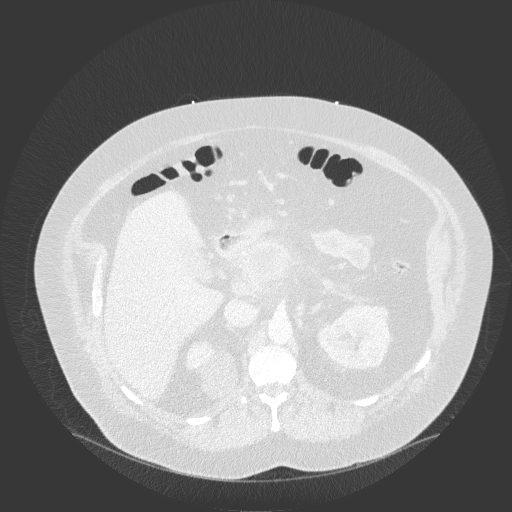
[im 26/171  lung]
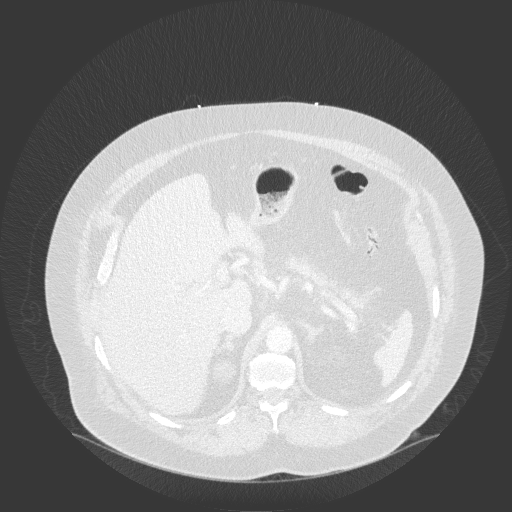
[im 38/171  lung]
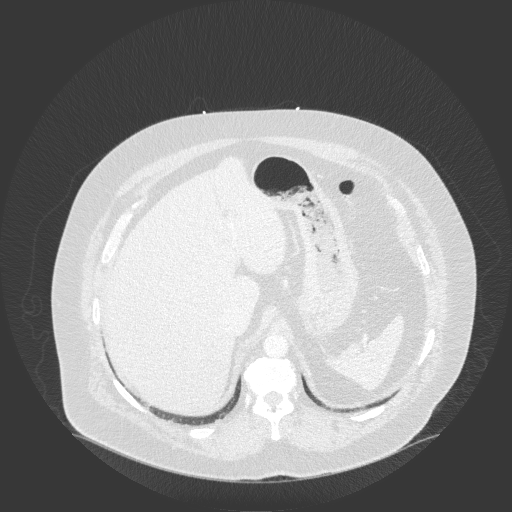
[im 51/171  lung]
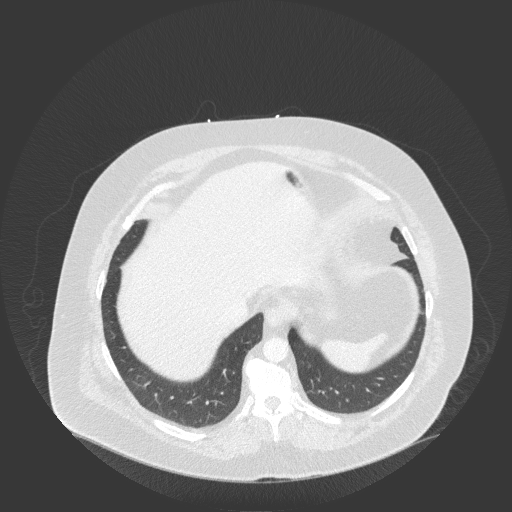
[im 63/171  mediastinal]
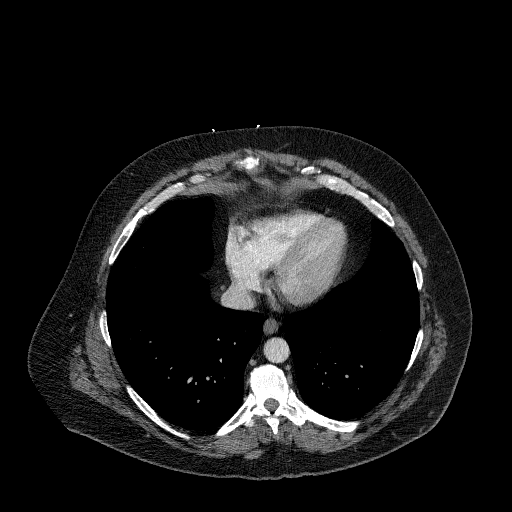
[im 63/171  lung]
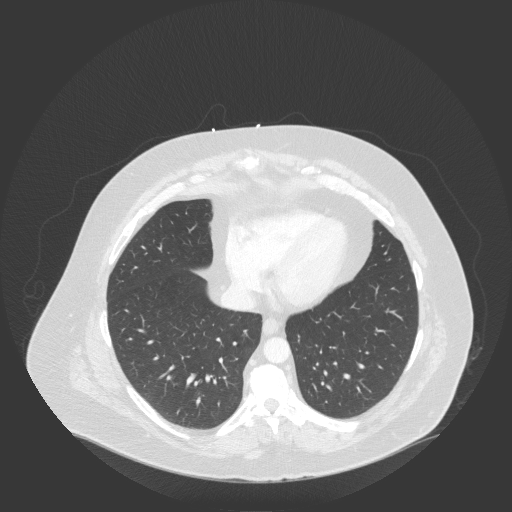
[im 76/171  lung]
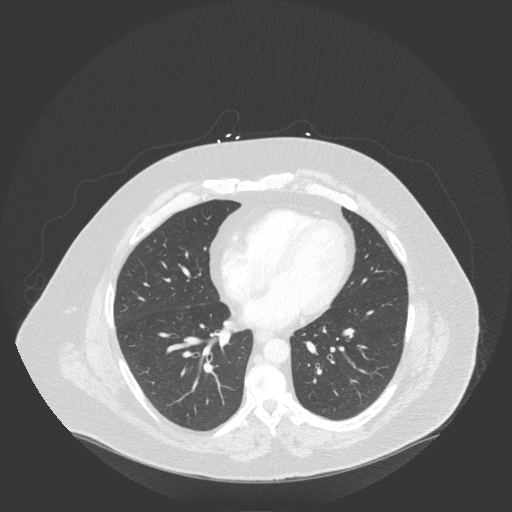
[im 95/171  lung]
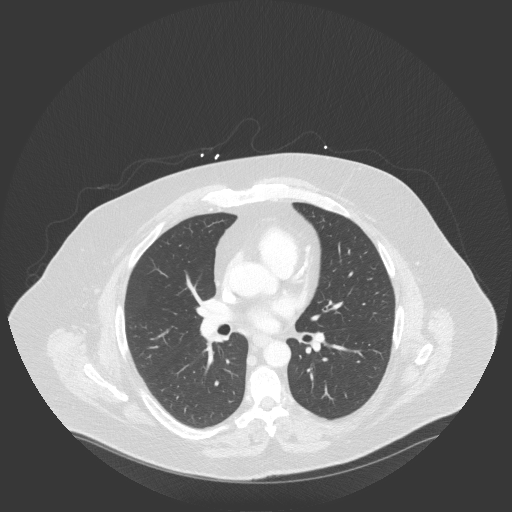
[im 108/171  lung]
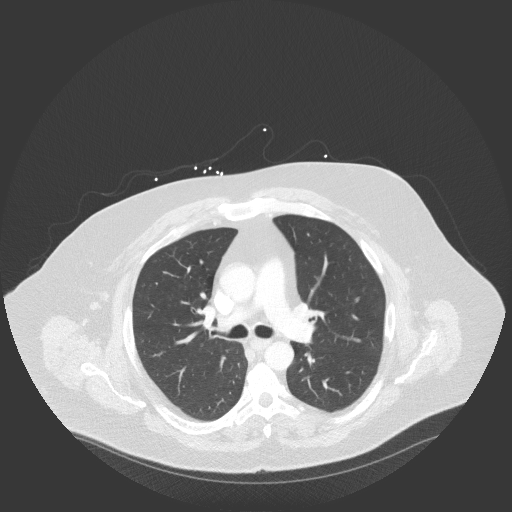
[im 120/171  mediastinal]
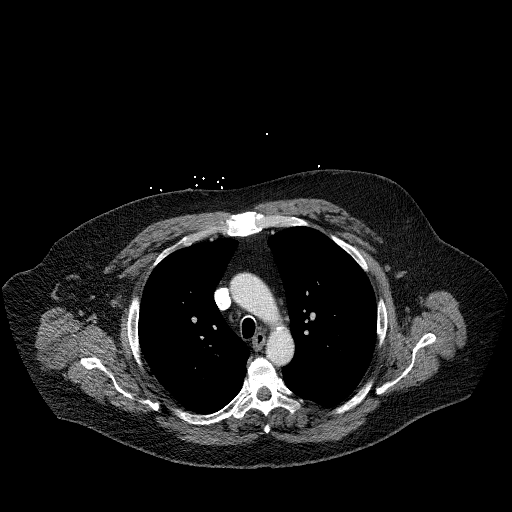
[im 120/171  lung]
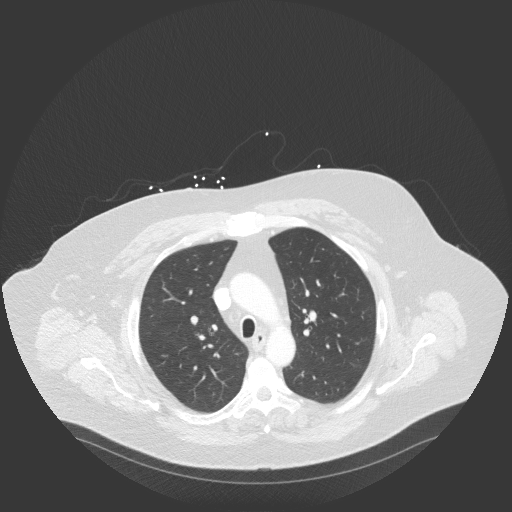
[im 133/171  lung]
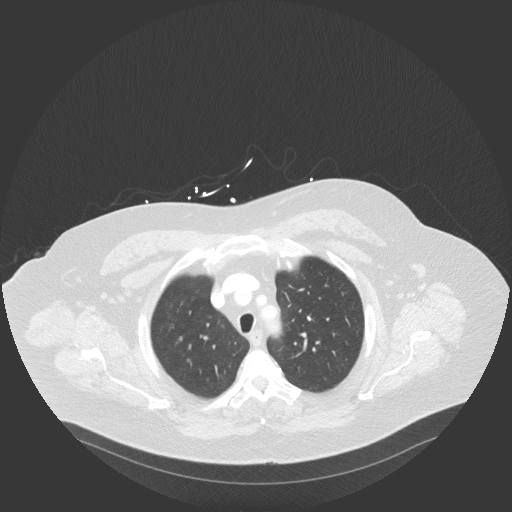
[im 145/171  lung]
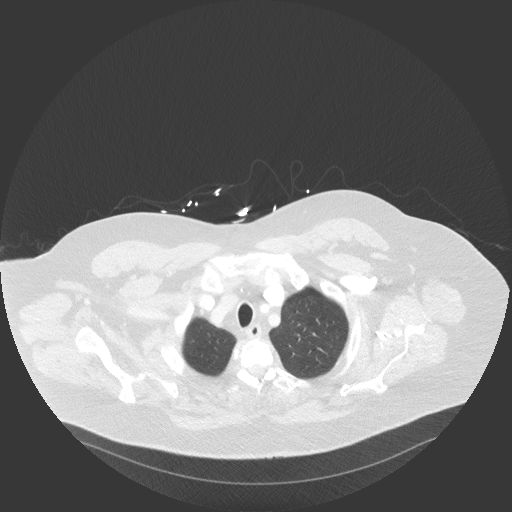
[im 158/171  lung]
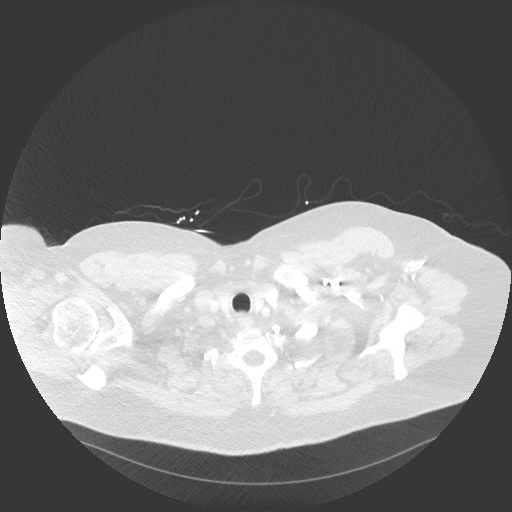

[Series 5: coronal · coronal · 0.69mm/px · 3 of 187 slices shown]
[im 38/187  lung]
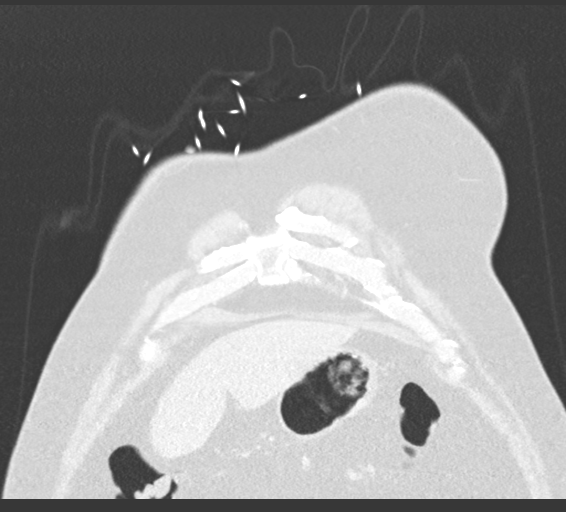
[im 75/187  lung]
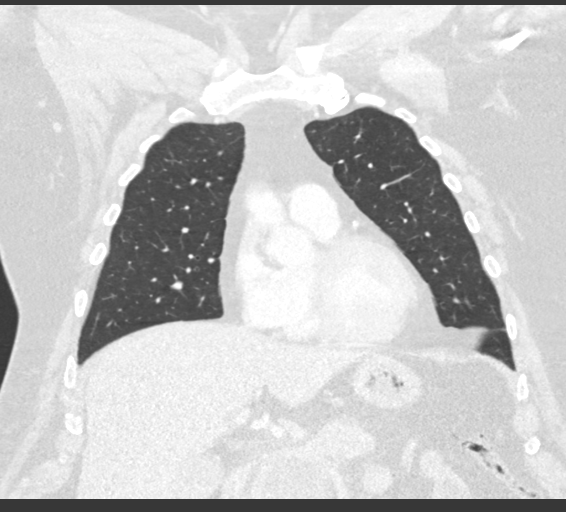
[im 112/187  lung]
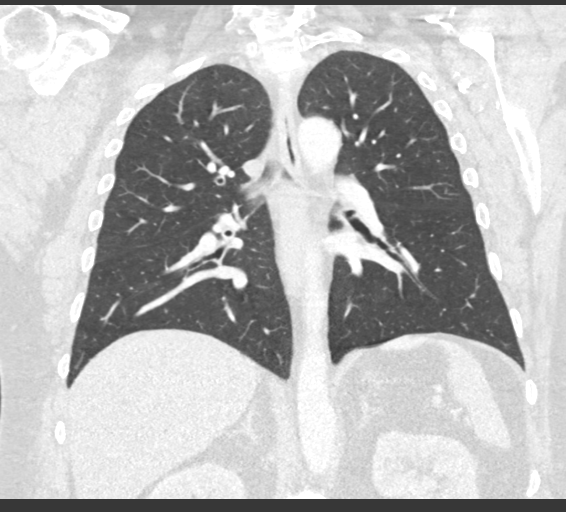

[15 of 36 positions shown; findings below may reference images not displayed]

FINDINGS: Cardiovascular: Normal heart size. No pericardial effusion. No
thoracic aortic aneurysm or dissection. Left anterior descending
coronary artery atherosclerotic calcification. No central pulmonary
embolism.

Mediastinum/Nodes: No enlarged mediastinal, hilar, or axillary lymph
nodes. Thyroid gland, trachea, and esophagus demonstrate no
significant findings.

Lungs/Pleura: Lungs are clear. No pleural effusion or pneumothorax.

Upper Abdomen: Inflammatory changes surrounding the pancreatic head
have mildly increased. Unchanged 4.5 cm fluid collection in the
pancreatic head.

Musculoskeletal: No chest wall abnormality. No acute or significant
osseous findings. Prominent bony hypertrophy of the left first rib
sternocostal synchondrosis. Mild thoracic spondylosis.
IMPRESSION: 1. No lung mass. Density seen on chest x-ray corresponds to
prominent bony hypertrophy of the left first rib sternocostal
synchondrosis.
2.  No acute intrathoracic process.
3. Slightly increased peripancreatic head inflammatory changes with
unchanged 4.5 cm fluid collection, likely a pseudocyst. Follow-up
pancreatic protocol CT of the abdomen with and without contrast in 3
months is recommended to assess for interval change and exclude the
unlikely possibility of underlying malignancy.

## 2018-11-13 MED ORDER — GADOBUTROL 1 MMOL/ML IV SOLN
10.0000 mL | Freq: Once | INTRAVENOUS | Status: AC | PRN
  Administered 2018-11-13: 16:00:00 10 mL via INTRAVENOUS

## 2018-11-13 MED ORDER — IOHEXOL 300 MG/ML  SOLN
75.0000 mL | Freq: Once | INTRAMUSCULAR | Status: AC | PRN
  Administered 2018-11-13: 75 mL via INTRAVENOUS

## 2018-11-13 NOTE — Consult Note (Signed)
Referring Provider:  TH/ Dr. Gerri LinsSwayze Primary Care Physician:  Dr. Nicholos Johnseade Primary Gastroenterologist:  Dr. Madilyn FiremanHayes   Reason for Consultation: Pancreatitis, abnormal LFTs  HPI: Philip Rhodes is a 58 y.o. male with past medical history of hypertension, hyperlipidemia and sleep apnea admitted to the hospital for further management of acute pancreatitis.  GI is consulted for further management as patient's LFTs are trending upwards.  [facility].  Patient was seen by primary care physician on November 11, 2022 acute onset of abdominal pain a few days duration.  Blood work at that time showed elevated lipase at 634, T bili of 1.7, AST 77 and ALT of 163.  Blood work on November 02, 2018 showed normal CBC and CMP.  CT abdomen pelvis with IV contrast on November 11, 2018 showed mild inflammatory changes around the pancreas suggesting of acute pancreatitis as well as 4.5 cm fluid collection around the pancreatic head consistent with pseudocyst.  Patient seen and examined at bedside.  Started having acute abdominal pain 4 days ago.  Associated with nausea without any vomiting.  His abdominal pain has improved since hospitalization.  Denies any further nausea.  Denies any fever or chills.  Denies diarrhea or constipation.  Denies blood in the stool or black stool.  Drinks 12 pack beer every night.  Colonoscopy in August 2015 by Dr. Madilyn FiremanHayes shows hemorrhoids otherwise normal.  Repeat recommended in 10 years.   Past Medical History:  Diagnosis Date  . Hyperlipemia   . Hypertension   . Sleep apnea     History reviewed. No pertinent surgical history.  Prior to Admission medications   Medication Sig Start Date End Date Taking? Authorizing Provider  amLODipine (NORVASC) 5 MG tablet Take 5 mg by mouth daily. 11/11/18  Yes [provider]  Ascorbic Acid (VITAMIN C PO) Take 1 tablet by mouth daily.   Yes [provider]  aspirin EC 81 MG tablet Take 81 mg by mouth at bedtime.    Yes [provider]  lisinopril-hydrochlorothiazide (ZESTORETIC) 20-12.5 MG tablet Take 1 tablet by mouth every morning. 11/02/18  Yes [provider]  Multiple Vitamins-Minerals (MULTIVITAMIN ADULT PO) Take 1 tablet by mouth daily.   Yes [provider]  Omega-3 Fatty Acids (FISH OIL PO) Take 1 tablet by mouth daily.   Yes [provider]  simvastatin (ZOCOR) 40 MG tablet Take 40 mg by mouth every evening. 09/10/18  Yes [provider]  HYDROcodone-acetaminophen (NORCO/VICODIN) 5-325 MG tablet Take 1 tablet by mouth every 6 (six) hours as needed for pain. 11/11/18   [provider]    Scheduled Meds: Continuous Infusions: . piperacillin-tazobactam (ZOSYN)  IV 12.5 mL/hr at 11/13/18 0636   PRN Meds:.hydrALAZINE, morphine injection, ondansetron **OR** ondansetron (ZOFRAN) IV  Allergies as of 11/11/2018  . (No Known Allergies)    Family History  Problem Relation Age of Onset  . Hypertension Mother     Social History   Socioeconomic History  . Marital status: Married    Spouse name: Buyer, retailBeth  . Number of children: Not on file  . Years of education: Not on file  . Highest education level: Not on file  Occupational History  . Not on file  Social Needs  . Financial resource strain: Not on file  . Food insecurity    Worry: Not on file    Inability: Not on file  . Transportation needs    Medical: Not on file    Non-medical: Not on file  Tobacco Use  . Smoking status: Current Every Day Smoker    Packs/day: 1.00  . Smokeless tobacco: Never Used  Substance and Sexual Activity  . Alcohol use: Yes    Comment: weekends  . Drug use: Not Currently  . Sexual activity: Yes  Lifestyle  . Physical activity    Days per week: Not on file    Minutes per session: Not on file  . Stress: Not on file  Relationships  . Social Herbalist on phone: Not on file    Gets together: Not on file    Attends religious service: Not on file     Active member of club or organization: Not on file    Attends meetings of clubs or organizations: Not on file    Relationship status: Not on file  . Intimate partner violence    Fear of current or ex partner: Not on file    Emotionally abused: Not on file    Physically abused: Not on file    Forced sexual activity: Not on file  Other Topics Concern  . Not on file  Social History Narrative  . Not on file    Review of Systems: Review of Systems  Constitutional: Positive for malaise/fatigue. Negative for chills, fever and weight loss.  HENT: Negative for hearing loss and tinnitus.   Eyes: Negative for blurred vision and double vision.  Respiratory: Negative for cough and hemoptysis.   Cardiovascular: Negative for chest pain and palpitations.  Gastrointestinal: Positive for abdominal pain and nausea. Negative for blood in stool, constipation, diarrhea, heartburn, melena and vomiting.  Genitourinary: Negative for dysuria and urgency.  Musculoskeletal: Negative for myalgias and neck pain.  Skin: Negative for itching and rash.  Neurological: Negative for seizures and weakness.  Endo/Heme/Allergies: Does not bruise/bleed easily.  Psychiatric/Behavioral: Negative for hallucinations and suicidal ideas.    Physical Exam: Vital signs: Vitals:   11/12/18 2058 11/13/18 0451  BP: (!) 145/73 (!) 141/75  Pulse: 71 77  Resp: 20 16  Temp: 99 F (37.2 C) 98.2 F (36.8 C)  SpO2: 96% 96%   Last BM Date: 11/12/18 Physical Exam  Constitutional: He is oriented to person, place, and time. He appears well-developed and well-nourished. No distress.  HENT:  Head: Normocephalic and atraumatic.  Mouth/Throat: Oropharynx is clear and moist. No oropharyngeal exudate.  Eyes: EOM are normal. No scleral icterus.  Neck: Normal range of motion. Neck supple.  Cardiovascular: Normal rate and regular rhythm.  No murmur heard. Pulmonary/Chest: Effort normal and breath sounds normal. No respiratory  distress.  Abdominal: Soft. Bowel sounds are normal. He exhibits no distension. There is no abdominal tenderness. There is no rebound and no guarding.  Musculoskeletal: Normal range of motion.        General: No edema.  Neurological: He is alert and oriented to person, place, and time.  Skin: Skin is warm. No rash noted. No erythema.  Psychiatric: He has a normal mood and affect. Judgment and thought content normal.  Vitals reviewed.   GI:  Lab Results: Recent Labs    11/11/18 2050 11/12/18 0625 11/13/18 0120  WBC 16.9* 16.1* 12.9*  HGB 16.1 14.8 13.8  HCT 46.4 42.6 40.1  PLT 389 353 315   BMET Recent Labs    11/11/18 2050 11/13/18 0120  NA 127* 130*  K 3.1* 3.5  CL 91* 96*  CO2 21* 22  GLUCOSE 191* 122*  BUN 5* <5*  CREATININE 0.58* 0.59*  CALCIUM 9.0 8.2*  LFT Recent Labs    11/12/18 0625 11/13/18 0120  PROT 6.7 6.6  ALBUMIN 3.2* 3.1*  AST 134* 154*  ALT 202* 238*  ALKPHOS 85 90  BILITOT 4.2* 7.9*  BILIDIR 2.7*  --   IBILI 1.5*  --    PT/INR Recent Labs    11/11/18 2050  LABPROT 13.7  INR 1.1     Studies/Results: Dg Chest 2 View  Result Date: 11/11/2018 CLINICAL DATA:  Sepsis.  Hypertension. EXAM: CHEST - 2 VIEW COMPARISON:  12/07/2007 FINDINGS: Heart size is mildly enlarged. Mediastinal shadows are normal. No evidence of pneumonia, collapse or effusion. Density in the left upper chest region on the frontal view probably relates to the first rib and, but the possibility of a left upper lobe mass is not excluded. Consider chest CT. No bone finding otherwise. IMPRESSION: No sign of pneumonia, collapse or effusion. Asymmetric density in the upper left chest on the frontal view. Whereas this could represent density related to the first rib end, lung mass is not excluded and chest CT would be suggested. Electronically Signed   By: Paulina Fusi M.D.   On: 11/11/2018 21:02   US Abdomen Limited Ruq  Result Date: 11/12/2018 CLINICAL DATA:  58 year old male  with abdominal pain and pancreatitis. EXAM: ULTRASOUND ABDOMEN LIMITED RIGHT UPPER QUADRANT COMPARISON:  11/11/2018 CT FINDINGS: Gallbladder: The gallbladder is unremarkable. There is no evidence of cholelithiasis or acute cholecystitis. Common bile duct: Diameter: 3.5 mm. No intrahepatic or extrahepatic biliary dilatation identified. Liver: No focal abnormalities are identified. UPPER limits of normal hepatic echogenicity may represent mild hepatic steatosis. Portal vein is patent on color Doppler imaging with normal direction of blood flow towards the liver. Other: None. IMPRESSION: 1. No biliary dilatation. 2. Unremarkable gallbladder. No evidence of cholelithiasis or acute cholecystitis. 3. Possible mild hepatic steatosis. Electronically Signed   By: Harmon Pier M.D.   On: 11/12/2018 14:47    Impression/Plan: -Acute pancreatitis with pseudocyst.  Patient drinks 12 pack  beer every night.  Most likely alcohol induced pancreatitis.  No gallstones on recent ultrasound.  Normal triglycerides.  No new medications. -Jaundice.  Could be from worsening pancreatitis although clinically he feels better. -Abnormal chest x-ray.  Recommendations ------------------------ -Check MRI MRCP for further evaluation. -Advance diet to full liquid -Monitor daily CMP. -Avoid alcohol use -GI will follow    LOS: 1 day   Kathi Der  MD, FACP 11/13/2018, 12:32 PM  Contact #  [phone]

## 2018-11-13 NOTE — Progress Notes (Addendum)
PROGRESS NOTE  Eual FinesCraig Yore ZOX:[id number] DOB: May 18, 1960 DOA: 11/11/2018 PCP: Patient, No Pcp Per  Brief History   Eual FinesCraig Corrie is a 58 y.o. male with history of hypertension, hyperlipidemia, sleep apnea has been experiencing abdominal pain mostly in the epigastric area for last 2 days with no associated nausea vomiting or diarrhea.  Eyes any chest pain shortness of breath fever or chills.  Due to persistent pain patient had gone to his primary care physician who ordered CT abdomen pelvis which shows features concerning for acute pancreatitis with pseudocyst formation.  Patient states he only drinks alcohol on the weekends.   ED Course: In the ER patient was given pain relief medication and IV fluids.  Labs revealed sodium 127 potassium 3.1 glucose 191.  Creatinine is 9 hemoglobin 16 hematocrit 46.4 WBC count 16.9.  Lipase was 390 AST 88 ALT 169 total bilirubin 2.4.  CT scan did not show any gallbladder stones.  Patient admitted for further management of acute pancreatitis.  Chest x-ray was abnormal for which they have suggested CT chest.  The patient was admittd to a medical bed. He was kept NPO. Rt upper quadrant US was unremarkable. He was started on IV zosyn. Lipase has decreased somewhat, but his bilirubin has continued to increase. GI has been consulted.  Consultants  . Gastroenterology  Procedures  . None  Antibiotics   Anti-infectives (From admission, onward)   Start     Dose/Rate Route Frequency Ordered Stop   11/12/18 1400  piperacillin-tazobactam (ZOSYN) IVPB 3.375 g  Status:  Discontinued     3.375 g 100 mL/hr over 30 Minutes Intravenous Every 8 hours 11/12/18 1151 11/12/18 1227   11/12/18 1400  piperacillin-tazobactam (ZOSYN) IVPB 3.375 g     3.375 g 12.5 mL/hr over 240 Minutes Intravenous Every 8 hours 11/12/18 1227       .   Subjective  The patient is sitting up in bed. He states that he is feeling better. No new complaints.  Objective   Vitals:  Vitals:    11/13/18 0451 11/13/18 1324  BP: (!) 141/75 135/68  Pulse: 77 72  Resp: 16 18  Temp: 98.2 F (36.8 C) 98.6 F (37 C)  SpO2: 96% 97%    Exam:  Constitutional:  . The patient is awake and alert and oriented x 3. He is in no acute distress. Respiratory:  . There is no increased work of breathing. . No wheezes, rales, or rhonchi. . No tactile fremitus. Marland Kitchen. Respiratory effort normal. No retractions or accessory muscle use Cardiovascular:  . RRR, no m/r/g . No LE extremity edema   . Normal pedal pulses Abdomen:  . Abdomen is soft, non-distended, still with discomfort in the epigastrum and left upper quadrant. . No hernias, masses, or organomegaly. . Normoactive bowel sounds.  Musculoskeletal:  . No cyanosis, clubbing, or edema Skin:  . No rashes, lesions, ulcers . palpation of skin: no induration or nodules Neurologic:  . CN 2-12 intact . Sensation all 4 extremities intact Psychiatric:  . Mental status o Mood, affect appropriate o Orientation to person, place, time  . judgment and insight appear intact    I have personally reviewed the following:   Today's Data  . Vitals, CBC, CMP Imaging  . CXR: Abnormal opacity in right upper lobe. CT chest recommended . Right upper quadrant ultrasound. Negative for cholecystitis or CBD dilatation. Hepatic steatosis.  Scheduled Meds: Continuous Infusions: . piperacillin-tazobactam (ZOSYN)  IV 3.375 g (11/13/18 1359)    Principal Problem:  Acute pancreatitis Active Problems:   Essential hypertension   Elevated LFTs   Pancreatitis   LOS: 1 day   A & P  Pancreatitis: Etiology unclear. The patient does occasionally drink heavily. He is also on hydrochlorothiazide which may cause pancreatitis. Monitor lipase. Pain seems to be improved. Will advance diet. CT chest demonstrated increased peripancreatic head inflammatory changes with a 4.5 cm fluid collection representing a pseudocyst. They have recommended follow up with CT of  the abdomen with and without contrast in the 3 months to evaluate for interval change and exclude the unlikely possibility of underlying malignancy.  Elevated LFT's: Bilirubin is increased again this morning. Right upper quadrant does not demonstrate CBD dilatation. Liver appearance is consistent with hepatic steatosis. Monitor and consider consult GI if increased again in the am. 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 GI has been consulted.  Essential Hypertension: Blood pressure is well controlled with out any antihypertensives. His amlodipine and lisinopril/HCTZ have been held. It is quite possible that HCTZ may have played a role in his pancreatitis.  Hyperlipidemia: Zocor has been held.   Abnormal finding on CXR. Recommendation is for follow up CT chest to further investigate. CT demonstrated no lung mass.   I have seen and examined this patient myself. I have spent 35 minutes in his evaluation and care.  DVT prophylaxis: SCD's Code Status: Full Code Family Communication: Patient's wife was at bedside Disposition Plan: Home  Priyansh Pry, DO [facility] Direct contact: see [email or web address]  7PM-7AM contact night coverage as above 11/13/2018, 4:46 PM  LOS: 0 days

## 2018-11-13 NOTE — Progress Notes (Signed)
RT arrived to place pt on CPAP dream station for the night and pt stated he can place himself on it. Pt also voiced concerns that the level the CPAP was set for was way too much for him. That his at home titrates between a high with is 19 and low which pt does not know for his comfort. RT changed setting to auto titrate of max 19 (home high setting) and 6 min with no O2 bled into the system. Pt states this is much more comfortable for him. RT filled reservoir with sterile water for pt and asked if he needed any further assistance. Pt respiratory status is stable at this time. RT will continue to monitor.

## 2018-11-14 LAB — CBC WITH DIFFERENTIAL/PLATELET
Abs Immature Granulocytes: 0.1 10*3/uL — ABNORMAL HIGH (ref 0.00–0.07)
Basophils Absolute: 0.1 10*3/uL (ref 0.0–0.1)
Basophils Relative: 1 %
Eosinophils Absolute: 0.5 10*3/uL (ref 0.0–0.5)
Eosinophils Relative: 5 %
HCT: 40.4 % (ref 39.0–52.0)
Hemoglobin: 14 g/dL (ref 13.0–17.0)
Immature Granulocytes: 1 %
Lymphocytes Relative: 23 %
Lymphs Abs: 2.3 10*3/uL (ref 0.7–4.0)
MCH: 31.9 pg (ref 26.0–34.0)
MCHC: 34.7 g/dL (ref 30.0–36.0)
MCV: 92 fL (ref 80.0–100.0)
Monocytes Absolute: 1.4 10*3/uL — ABNORMAL HIGH (ref 0.1–1.0)
Monocytes Relative: 13 %
Neutro Abs: 5.9 10*3/uL (ref 1.7–7.7)
Neutrophils Relative %: 57 %
Platelets: 298 10*3/uL (ref 150–400)
RBC: 4.39 MIL/uL (ref 4.22–5.81)
RDW: 13.6 % (ref 11.5–15.5)
WBC: 10.3 10*3/uL (ref 4.0–10.5)
nRBC: 0 % (ref 0.0–0.2)

## 2018-11-14 LAB — COMPREHENSIVE METABOLIC PANEL
ALT: 239 U/L — ABNORMAL HIGH (ref 0–44)
AST: 134 U/L — ABNORMAL HIGH (ref 15–41)
Albumin: 2.9 g/dL — ABNORMAL LOW (ref 3.5–5.0)
Alkaline Phosphatase: 107 U/L (ref 38–126)
Anion gap: 10 (ref 5–15)
BUN: 5 mg/dL — ABNORMAL LOW (ref 6–20)
CO2: 24 mmol/L (ref 22–32)
Calcium: 8.6 mg/dL — ABNORMAL LOW (ref 8.9–10.3)
Chloride: 98 mmol/L (ref 98–111)
Creatinine, Ser: 0.67 mg/dL (ref 0.61–1.24)
GFR calc Af Amer: >60 mL/min (ref >60)
GFR calc non Af Amer: >60 mL/min (ref >60)
Glucose, Bld: 106 mg/dL — ABNORMAL HIGH (ref 70–99)
Potassium: 3.2 mmol/L — ABNORMAL LOW (ref 3.5–5.1)
Sodium: 132 mmol/L — ABNORMAL LOW (ref 135–145)
Total Bilirubin: 5.5 mg/dL — ABNORMAL HIGH (ref 0.3–1.2)
Total Protein: 6.6 g/dL (ref 6.5–8.1)

## 2018-11-14 MED ORDER — POTASSIUM CHLORIDE CRYS ER 20 MEQ PO TBCR
40.0000 meq | EXTENDED_RELEASE_TABLET | ORAL | Status: AC
  Administered 2018-11-14 (×2): 40 meq via ORAL
  Filled 2018-11-14 (×2): qty 2

## 2018-11-14 NOTE — Progress Notes (Signed)
PROGRESS NOTE  Philip FinesCraig Rhodes ZOX:[id number] DOB: May 08, 1960 DOA: 11/11/2018 PCP: Patient, No Pcp Per  Brief History   Philip Rhodes is a 58 y.o. male with history of hypertension, hyperlipidemia, sleep apnea has been experiencing abdominal pain mostly in the epigastric area for last 2 days with no associated nausea vomiting or diarrhea.  Eyes any chest pain shortness of breath fever or chills.  Due to persistent pain patient had gone to his primary care physician who ordered CT abdomen pelvis which shows features concerning for acute pancreatitis with pseudocyst formation.  Patient states he only drinks alcohol on the weekends.   ED Course: In the ER patient was given pain relief medication and IV fluids.  Labs revealed sodium 127 potassium 3.1 glucose 191.  Creatinine is 9 hemoglobin 16 hematocrit 46.4 WBC count 16.9.  Lipase was 390 AST 88 ALT 169 total bilirubin 2.4.  CT scan did not show any gallbladder stones.  Patient admitted for further management of acute pancreatitis.  Chest x-ray was abnormal for which they have suggested CT chest.  The patient was admittd to a medical bed. He was kept NPO. Rt upper quadrant US was unremarkable. He was started on IV zosyn. Lipase has decreased somewhat, but his bilirubin has continued to increase. GI has been consulted.  11/14/2018: Patient seen.  Urine.  His cell appreciated.  For left tube in the morning.  Patient is currently tolerating soft diet.  We will continue to advance as tolerated.  No pain reported.  Likely discharge back home in the morning.  The bilirubin is improving.  Total bilirubin today is 5.5, an improvement from 7.9 yesterday.  Consultants  . Gastroenterology  Procedures  . None  Antibiotics   Anti-infectives (From admission, onward)   Start     Dose/Rate Route Frequency Ordered Stop   11/12/18 1400  piperacillin-tazobactam (ZOSYN) IVPB 3.375 g  Status:  Discontinued     3.375 g 100 mL/hr over 30 Minutes Intravenous Every  8 hours 11/12/18 1151 11/12/18 1227   11/12/18 1400  piperacillin-tazobactam (ZOSYN) IVPB 3.375 g  Status:  Discontinued     3.375 g 12.5 mL/hr over 240 Minutes Intravenous Every 8 hours 11/12/18 1227 11/14/18 1123     .   Subjective  No fever or chills No abdominal pain No nausea vomiting Tolerating soft diet Objective   Vitals:  Vitals:   11/14/18 0447 11/14/18 1303  BP: 120/78 (!) 113/92  Pulse: (!) 53 66  Resp: 19 18  Temp: 99.2 F (37.3 C) 99.3 F (37.4 C)  SpO2: 96% 98%    Exam:  Constitutional:  . The patient is awake and alert and oriented x 3. He is in no acute distress.  Patient is morbidly obese. Respiratory:  . Clear to auscultation Cardiovascular:  S1-S2 Abdomen:  . Abdomen is morbidly obese, soft and nontender.   . Organs are difficult to assess.   Neurologic:  . Patient is awake and alert. . Patient moves all extremities.  Today's Data  . Vitals, CBC, CMP Imaging  . CXR: Abnormal opacity in right upper lobe. CT chest recommended . Right upper quadrant ultrasound. Negative for cholecystitis or CBD dilatation. Hepatic steatosis.  Scheduled Meds: Continuous Infusions:   Principal Problem:   Acute pancreatitis Active Problems:   Essential hypertension   Elevated LFTs   Pancreatitis   LOS: 2 days   A & P  Pancreatitis: Etiology unclear. The patient does occasionally drink heavily. He is also on hydrochlorothiazide which may cause pancreatitis.  Monitor lipase. Pain seems to be improved. Will advance diet. CT chest demonstrated increased peripancreatic head inflammatory changes with a 4.5 cm fluid collection representing a pseudocyst. They have recommended follow up with CT of the abdomen with and without contrast in the 3 months to evaluate for interval change and exclude the unlikely possibility of underlying malignancy. 11/14/2018: GI input is appreciated.  Abdominal pain has resolved.  No GI symptoms.  Bilirubin is improving.  Continue to  advance diet as tolerated.  Repeat LFTs in the morning.  Likely DC tomorrow  Elevated LFT's: Bilirubin is increased again this morning. Right upper quadrant does not demonstrate CBD dilatation. Liver appearance is consistent with hepatic steatosis. Monitor and consider consult GI if increased again in the am. 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 GI has been consulted. 11/14/2018: Currently see above.  Essential Hypertension: Blood pressure is well controlled with out any antihypertensives. His amlodipine and lisinopril/HCTZ have been held. It is quite possible that HCTZ may have played a role in his pancreatitis.  Hyperlipidemia: Zocor has been held.   Abnormal finding on CXR. Recommendation is for follow up CT chest to further investigate. CT demonstrated no lung mass.   DVT prophylaxis: SCD's Code Status: Full Code Family Communication: Patient's wife was at bedside Disposition Plan: Home  Dana Allan MD  [facility] Direct contact: see [email or web address]  7PM-7AM contact night coverage as above

## 2018-11-14 NOTE — Progress Notes (Signed)
Pt states he can place himself on/off CPAP when ready per home use

## 2018-11-14 NOTE — Progress Notes (Signed)
[facility] Gastroenterology Progress Note  Philip Rhodes 58 y.o. 25-Nov-1960  CC: Acute pancreatitis with pseudocyst   Subjective: Patient is feeling better.  Abdominal pain is resolved.  Tolerating full liquid diet without any nausea or vomiting.  ROS : Denies chest pain and shortness of breath.   Objective: Vital signs in last 24 hours: Vitals:   11/13/18 2301 11/14/18 0447  BP:  120/78  Pulse:  (!) 53  Resp:  19  Temp:  99.2 F (37.3 C)  SpO2: 98% 96%    Physical Exam:  General:  Alert, cooperative, no distress, appears stated age  Head:  Normocephalic, without obvious abnormality, atraumatic  Eyes:   Icterus improved  Lungs:   Clear to auscultation bilaterally, respirations unlabored  Heart:  Regular rate and rhythm, S1, S2 normal  Abdomen:   Soft, non-tender, bowel sounds active all four quadrants,  no masses,   Extremities: Extremities normal, atraumatic, no  edema  Pulses: 2+ and symmetric    Lab Results: Recent Labs    11/13/18 0120 11/14/18 0239  NA 130* 132*  K 3.5 3.2*  CL 96* 98  CO2 22 24  GLUCOSE 122* 106*  BUN <5* 5*  CREATININE 0.59* 0.67  CALCIUM 8.2* 8.6*   Recent Labs    11/13/18 0120 11/14/18 0239  AST 154* 134*  ALT 238* 239*  ALKPHOS 90 107  BILITOT 7.9* 5.5*  PROT 6.6 6.6  ALBUMIN 3.1* 2.9*   Recent Labs    11/13/18 0120 11/14/18 0239  WBC 12.9* 10.3  NEUTROABS 9.3* 5.9  HGB 13.8 14.0  HCT 40.1 40.4  MCV 94.1 92.0  PLT 315 298   Recent Labs    11/11/18 2050  LABPROT 13.7  INR 1.1      Assessment/Plan:- Acute pancreatitis with pseudocyst.  Patient drinks 12 pack  beer every night.  Most likely alcohol induced pancreatitis.  No gallstones on recent ultrasound.  Normal triglycerides.  No new medications. -Jaundice.  Could be from worsening pancreatitis although clinically he feels better. -Abnormal chest x-ray.  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   Recommended repeat MRI abdomen with and without contrast in 3 months for follow-up.  He does have a mass-effect on distal common bile duct from pancreatic pseudocyst.  -LFTs improving.  Abdominal pain resolved.  -Advance diet to soft.  DC Zosyn.  -Repeat LFTs in the morning.  If LFT continues to improve, consider discharge home tomorrow.  -  Recommended repeat MRI abdomen with and without contrast in 3 months for follow-up.  -GI will follow  Otis Brace MD, [place] 11/14/2018, 11:25 AM  Contact #  [phone]

## 2018-11-15 LAB — COMPREHENSIVE METABOLIC PANEL
ALT: 177 U/L — ABNORMAL HIGH (ref 0–44)
AST: 73 U/L — ABNORMAL HIGH (ref 15–41)
Albumin: 2.9 g/dL — ABNORMAL LOW (ref 3.5–5.0)
Alkaline Phosphatase: 104 U/L (ref 38–126)
Anion gap: 8 (ref 5–15)
BUN: 8 mg/dL (ref 6–20)
CO2: 24 mmol/L (ref 22–32)
Calcium: 8.7 mg/dL — ABNORMAL LOW (ref 8.9–10.3)
Chloride: 102 mmol/L (ref 98–111)
Creatinine, Ser: 0.67 mg/dL (ref 0.61–1.24)
GFR calc Af Amer: >60 mL/min (ref >60)
GFR calc non Af Amer: >60 mL/min (ref >60)
Glucose, Bld: 107 mg/dL — ABNORMAL HIGH (ref 70–99)
Potassium: 4.2 mmol/L (ref 3.5–5.1)
Sodium: 134 mmol/L — ABNORMAL LOW (ref 135–145)
Total Bilirubin: 2.1 mg/dL — ABNORMAL HIGH (ref 0.3–1.2)
Total Protein: 6.4 g/dL — ABNORMAL LOW (ref 6.5–8.1)

## 2018-11-15 NOTE — Progress Notes (Addendum)
Bilirubin and transaminases much improved.  Pt tolerating soft diet.  No pain for several days.  EXAM:  Sitting up in chair in NAD, able to walk around room easily.  Pleasant, feels ready to go home.  Abd very rotund and firm (?"beer belly") but completely nontender to firm palpation of upper abd.  IMPR:  Resolving alcohol-related pancreatitis and hepatitis.  RECOMM:    1. Ok for dischg from GI standpoint 2. Pt advised to avoid alcohol 3. Would recomm outpt f/u w/ PCP; f/u w/ GI not required unless PCP feels it's needed. 4. Would favor outpt f/u study on pancreas (MRI vs CT) in 2-3 mos to confirm resolution of pseudocyst.  ADDENDUM:  I spoke w/ Dr. Alessandra Bevels who indicates he will take care of arranging f/u MRI scan.  Cleotis Nipper, M.D. Pager [phone] If no answer or after 5 PM call [phone]

## 2018-11-15 NOTE — Progress Notes (Addendum)
Patient discharged to home. Verbalizes understanding of all discharge instructions including changes to discharge medications and follow up MD visits with GI. Patient awaiting ride.  1652 Patient discharged

## 2018-11-15 NOTE — Discharge Summary (Signed)
Physician Discharge Summary  Patient ID: Philip Rhodes MRN: [id number] DOB/AGE: 1960/07/06 57 y.o.  Admit date: 11/11/2018 Discharge date: 11/15/2018  Admission Diagnoses:  Discharge Diagnoses:  Principal Problem:   Acute pancreatitis Active Problems:   Essential hypertension   Elevated LFTs   Pancreatitis   Discharged Condition: stable  Hospital Course: Patient is a 58 year old Caucasian male, morbidly obese, with past medical history significant for sleep apnea, hypertension and hyperlipidemia.  Patient was admitted with abdominal pain.  Work-up done revealed acute pancreatitis.  Etiology of the acute pancreatitis remains unclear.  Patient was admitted and managed supportively.  Patient symptoms have resolved significantly.  Bilirubin was initially elevated significantly, but has decreased significantly.  Bilirubin prior to discharge was 2.1.  Patient will follow with the GI team on discharge.  Patient will need repeat MRI of the abdomen on outpatient basis.  GI team is cleared patient for discharge.  Pancreatitis:  Etiology is unclear.  The patient drinks heavily occasionally. Patient was also on hydrochlorothiazide and lisinopril Abdominal pain has resolved. Total bilirubin is on the downward trend. GI team directed care. CT abdomen and pelvis revealed pancreatic pseudocyst cyst. Patient has been cleared for discharge by the GI team.  Essential Hypertension:  Controlled. Continue to monitor closely.  Hyperlipidemia: Continue Zocor  Consults: GI  Significant Diagnostic Studies:  MRI of the abdomen with and without contrast/MRCP revealed: Acute pancreatitis. Complex nonenhancing cystic lesion in the pancreatic head measures 4.4 x 3.7 x 4.0 cm and demonstrates eccentric nodular hemorrhagic/proteinaceous debris, most compatible with a pancreatic pseudocyst, stable in size since recent 11/11/2018 CT. Normal caliber common bile duct. Minimal intrahepatic  biliary ductal dilatation. No pancreatic duct dilation. Follow-up MRI abdomen without and with IV contrast recommended in 3 months. 2. Stable extrinsic mass-effect on the portosplenic venous confluence, without venous thrombosis. 3. Mild diffuse hepatic steatosis. 4. No cholelithiasis.  No choledocholithiasis.  CT abdomen/pelvis with contrast revealed: Findings consistent with acute pancreatitis. 4.5 cm fluid collection is noted in the pancreatic head most consistent with pseudocyst. Aortic Atherosclerosis   Discharge Exam: Blood pressure 137/80, pulse (!) 58, temperature 97.6 F (36.4 C), temperature source Oral, resp. rate 16, height 5\' 8"  (1.727 m), weight 127.2 kg, SpO2 99 %.  Disposition: Discharge disposition: 01-Home or Self Care   Discharge Instructions    Diet - low sodium heart healthy   Complete by: As directed    Increase activity slowly   Complete by: As directed      Allergies as of 11/15/2018   No Known Allergies     Medication List    STOP taking these medications   lisinopril-hydrochlorothiazide 20-12.5 MG tablet Commonly known as: ZESTORETIC     TAKE these medications   amLODipine 5 MG tablet Commonly known as: NORVASC Take 5 mg by mouth daily.   aspirin EC 81 MG tablet Take 81 mg by mouth at bedtime.   FISH OIL PO Take 1 tablet by mouth daily.   HYDROcodone-acetaminophen 5-325 MG tablet Commonly known as: NORCO/VICODIN Take 1 tablet by mouth every 6 (six) hours as needed for pain.   MULTIVITAMIN ADULT PO Take 1 tablet by mouth daily.   simvastatin 40 MG tablet Commonly known as: ZOCOR Take 40 mg by mouth every evening.   VITAMIN C PO Take 1 tablet by mouth daily.        SignedBonnell Public 11/15/2018, 3:08 PM

## 2018-11-16 LAB — CULTURE, BLOOD (ROUTINE X 2)
Culture: NO GROWTH
Culture: NO GROWTH
Special Requests: ADEQUATE

## 2018-11-24 ENCOUNTER — Other Ambulatory Visit: Payer: Self-pay | Admitting: Family Medicine

## 2018-11-24 DIAGNOSIS — K8689 Other specified diseases of pancreas: Secondary | ICD-10-CM

## 2018-11-29 ENCOUNTER — Emergency Department (HOSPITAL_COMMUNITY): Payer: 59

## 2018-11-29 ENCOUNTER — Inpatient Hospital Stay (HOSPITAL_COMMUNITY)
Admission: EM | Admit: 2018-11-29 | Discharge: 2018-12-05 | DRG: 444 | Disposition: A | Discharge status: Home / Self Care | Payer: 59 | Attending: Internal Medicine | Admitting: Internal Medicine

## 2018-11-29 ENCOUNTER — Encounter (HOSPITAL_COMMUNITY): Payer: Self-pay | Admitting: Emergency Medicine

## 2018-11-29 DIAGNOSIS — K760 Fatty (change of) liver, not elsewhere classified: Secondary | ICD-10-CM | POA: Diagnosis present

## 2018-11-29 DIAGNOSIS — K259 Gastric ulcer, unspecified as acute or chronic, without hemorrhage or perforation: Secondary | ICD-10-CM | POA: Diagnosis present

## 2018-11-29 DIAGNOSIS — Z791 Long term (current) use of non-steroidal anti-inflammatories (NSAID): Secondary | ICD-10-CM | POA: Diagnosis not present

## 2018-11-29 DIAGNOSIS — Z7982 Long term (current) use of aspirin: Secondary | ICD-10-CM

## 2018-11-29 DIAGNOSIS — F1010 Alcohol abuse, uncomplicated: Secondary | ICD-10-CM | POA: Diagnosis present

## 2018-11-29 DIAGNOSIS — F17210 Nicotine dependence, cigarettes, uncomplicated: Secondary | ICD-10-CM | POA: Diagnosis present

## 2018-11-29 DIAGNOSIS — K852 Alcohol induced acute pancreatitis without necrosis or infection: Secondary | ICD-10-CM | POA: Diagnosis present

## 2018-11-29 DIAGNOSIS — K863 Pseudocyst of pancreas: Secondary | ICD-10-CM | POA: Diagnosis present

## 2018-11-29 DIAGNOSIS — R17 Unspecified jaundice: Secondary | ICD-10-CM | POA: Diagnosis not present

## 2018-11-29 DIAGNOSIS — K831 Obstruction of bile duct: Secondary | ICD-10-CM | POA: Diagnosis present

## 2018-11-29 DIAGNOSIS — G4733 Obstructive sleep apnea (adult) (pediatric): Secondary | ICD-10-CM | POA: Diagnosis present

## 2018-11-29 DIAGNOSIS — R791 Abnormal coagulation profile: Secondary | ICD-10-CM | POA: Diagnosis present

## 2018-11-29 DIAGNOSIS — E871 Hypo-osmolality and hyponatremia: Secondary | ICD-10-CM | POA: Diagnosis present

## 2018-11-29 DIAGNOSIS — Z79891 Long term (current) use of opiate analgesic: Secondary | ICD-10-CM | POA: Diagnosis not present

## 2018-11-29 DIAGNOSIS — Z79899 Other long term (current) drug therapy: Secondary | ICD-10-CM | POA: Diagnosis not present

## 2018-11-29 DIAGNOSIS — Z20828 Contact with and (suspected) exposure to other viral communicable diseases: Secondary | ICD-10-CM | POA: Diagnosis present

## 2018-11-29 DIAGNOSIS — Z8249 Family history of ischemic heart disease and other diseases of the circulatory system: Secondary | ICD-10-CM | POA: Diagnosis not present

## 2018-11-29 DIAGNOSIS — D72823 Leukemoid reaction: Secondary | ICD-10-CM | POA: Diagnosis not present

## 2018-11-29 DIAGNOSIS — K861 Other chronic pancreatitis: Secondary | ICD-10-CM | POA: Diagnosis present

## 2018-11-29 DIAGNOSIS — R7989 Other specified abnormal findings of blood chemistry: Secondary | ICD-10-CM | POA: Diagnosis present

## 2018-11-29 DIAGNOSIS — K859 Acute pancreatitis without necrosis or infection, unspecified: Secondary | ICD-10-CM | POA: Diagnosis not present

## 2018-11-29 DIAGNOSIS — Z720 Tobacco use: Secondary | ICD-10-CM | POA: Diagnosis not present

## 2018-11-29 DIAGNOSIS — K269 Duodenal ulcer, unspecified as acute or chronic, without hemorrhage or perforation: Secondary | ICD-10-CM | POA: Diagnosis present

## 2018-11-29 DIAGNOSIS — R6510 Systemic inflammatory response syndrome (SIRS) of non-infectious origin without acute organ dysfunction: Secondary | ICD-10-CM | POA: Diagnosis present

## 2018-11-29 DIAGNOSIS — R945 Abnormal results of liver function studies: Secondary | ICD-10-CM | POA: Diagnosis not present

## 2018-11-29 DIAGNOSIS — I10 Essential (primary) hypertension: Secondary | ICD-10-CM | POA: Diagnosis present

## 2018-11-29 DIAGNOSIS — F1011 Alcohol abuse, in remission: Secondary | ICD-10-CM | POA: Diagnosis present

## 2018-11-29 DIAGNOSIS — R109 Unspecified abdominal pain: Secondary | ICD-10-CM | POA: Diagnosis present

## 2018-11-29 DIAGNOSIS — K851 Biliary acute pancreatitis without necrosis or infection: Secondary | ICD-10-CM | POA: Diagnosis not present

## 2018-11-29 DIAGNOSIS — E785 Hyperlipidemia, unspecified: Secondary | ICD-10-CM | POA: Diagnosis present

## 2018-11-29 LAB — CBC WITH DIFFERENTIAL/PLATELET
Abs Immature Granulocytes: 0.06 10*3/uL (ref 0.00–0.07)
Basophils Absolute: 0.1 10*3/uL (ref 0.0–0.1)
Basophils Relative: 1 %
Eosinophils Absolute: 0.3 10*3/uL (ref 0.0–0.5)
Eosinophils Relative: 2 %
HCT: 44 % (ref 39.0–52.0)
Hemoglobin: 15.8 g/dL (ref 13.0–17.0)
Immature Granulocytes: 0 %
Lymphocytes Relative: 18 %
Lymphs Abs: 2.5 10*3/uL (ref 0.7–4.0)
MCH: 33.1 pg (ref 26.0–34.0)
MCHC: 35.9 g/dL (ref 30.0–36.0)
MCV: 92.1 fL (ref 80.0–100.0)
Monocytes Absolute: 1.3 10*3/uL — ABNORMAL HIGH (ref 0.1–1.0)
Monocytes Relative: 9 %
Neutro Abs: 9.9 10*3/uL — ABNORMAL HIGH (ref 1.7–7.7)
Neutrophils Relative %: 70 %
Platelets: 406 10*3/uL — ABNORMAL HIGH (ref 150–400)
RBC: 4.78 MIL/uL (ref 4.22–5.81)
RDW: 13.1 % (ref 11.5–15.5)
WBC: 14.2 10*3/uL — ABNORMAL HIGH (ref 4.0–10.5)
nRBC: 0 % (ref 0.0–0.2)

## 2018-11-29 LAB — URINALYSIS, ROUTINE W REFLEX MICROSCOPIC
Glucose, UA: NEGATIVE mg/dL
Ketones, ur: 20 mg/dL — AB
Leukocytes,Ua: NEGATIVE
Nitrite: NEGATIVE
Protein, ur: NEGATIVE mg/dL
Specific Gravity, Urine: 1.015 (ref 1.005–1.030)
pH: 5 (ref 5.0–8.0)

## 2018-11-29 LAB — COMPREHENSIVE METABOLIC PANEL
ALT: 251 U/L — ABNORMAL HIGH (ref 0–44)
AST: 138 U/L — ABNORMAL HIGH (ref 15–41)
Albumin: 3.4 g/dL — ABNORMAL LOW (ref 3.5–5.0)
Alkaline Phosphatase: 256 U/L — ABNORMAL HIGH (ref 38–126)
Anion gap: 12 (ref 5–15)
BUN: <5 mg/dL — ABNORMAL LOW (ref 6–20)
CO2: 21 mmol/L — ABNORMAL LOW (ref 22–32)
Calcium: 9 mg/dL (ref 8.9–10.3)
Chloride: 97 mmol/L — ABNORMAL LOW (ref 98–111)
Creatinine, Ser: 0.51 mg/dL — ABNORMAL LOW (ref 0.61–1.24)
GFR calc Af Amer: >60 mL/min (ref >60)
GFR calc non Af Amer: >60 mL/min (ref >60)
Glucose, Bld: 141 mg/dL — ABNORMAL HIGH (ref 70–99)
Potassium: 3.7 mmol/L (ref 3.5–5.1)
Sodium: 130 mmol/L — ABNORMAL LOW (ref 135–145)
Total Bilirubin: 10.7 mg/dL — ABNORMAL HIGH (ref 0.3–1.2)
Total Protein: 7.7 g/dL (ref 6.5–8.1)

## 2018-11-29 LAB — SARS CORONAVIRUS 2 BY RT PCR (HOSPITAL ORDER, PERFORMED IN ~~LOC~~ HOSPITAL LAB): SARS Coronavirus 2: NEGATIVE

## 2018-11-29 LAB — APTT: aPTT: 34 seconds (ref 24–36)

## 2018-11-29 LAB — PROTIME-INR
INR: 1.1 (ref 0.8–1.2)
Prothrombin Time: 14.2 seconds (ref 11.4–15.2)

## 2018-11-29 LAB — LIPASE, BLOOD: Lipase: 288 U/L — ABNORMAL HIGH (ref 11–51)

## 2018-11-29 IMAGING — CT CT ABD-PELV W/ CM
2 of 5 series · 17 of 46 positions shown, 19 images · IV contrast (APPLIED)
Comparison: [DATE]

CLINICAL DATA: Acute abdominal pain and nausea and vomiting.
Jaundice. Elevated liver enzymes. Pancreatitis.

EXAM:
CT ABDOMEN AND PELVIS WITH CONTRAST
TECHNIQUE: Multidetector CT imaging of the abdomen and pelvis was performed
using the standard protocol following bolus administration of
intravenous contrast.
CONTRAST:  100mL OMNIPAQUE IOHEXOL 300 MG/ML  SOLN

[Series 3: abd/ pelvis 5.0 i30f 2 · axial · 0.97mm/px · z∈[+787,+1207]mm · 14 of 94 slices shown, 16 images]
[im 5/94  soft-tissue]
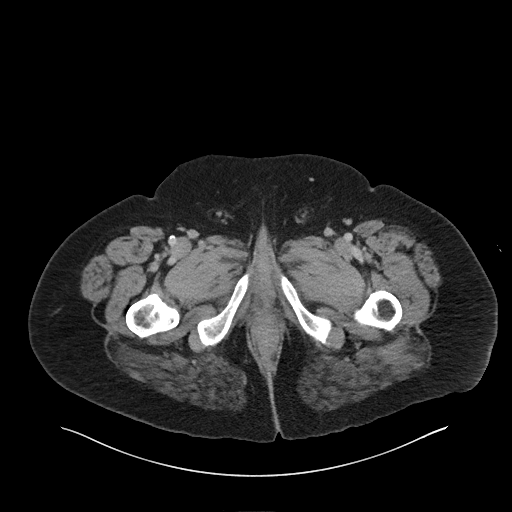
[im 5/94  bone]
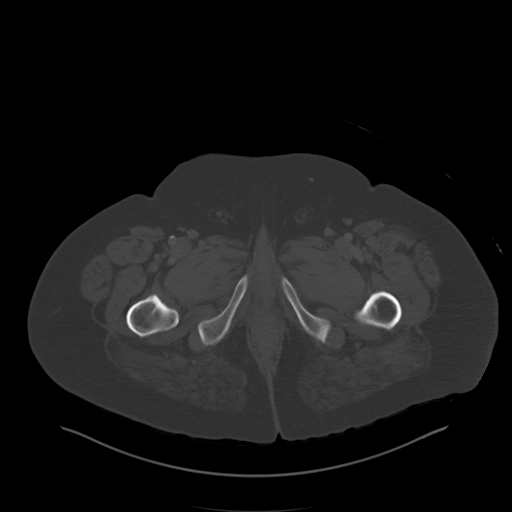
[im 10/94  soft-tissue]
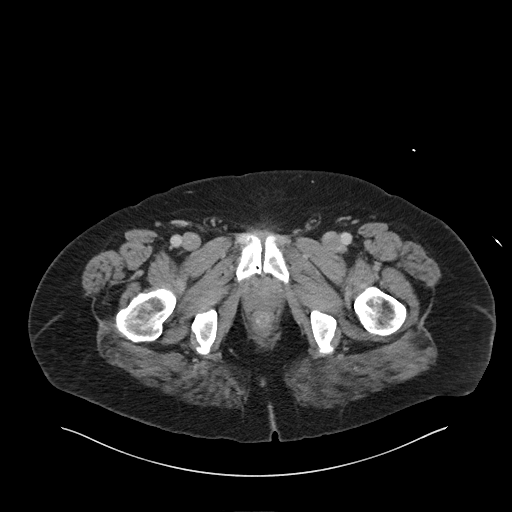
[im 20/94  soft-tissue]
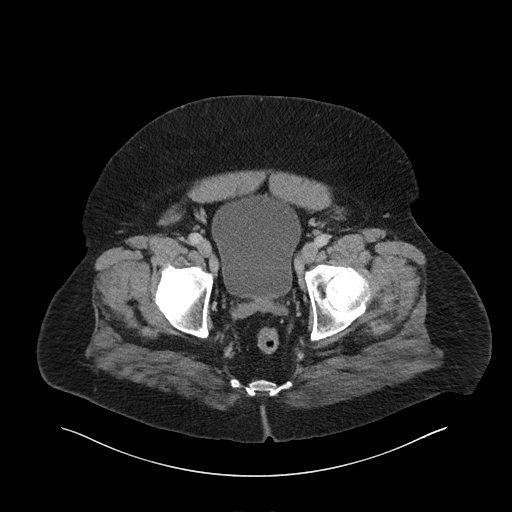
[im 25/94  soft-tissue]
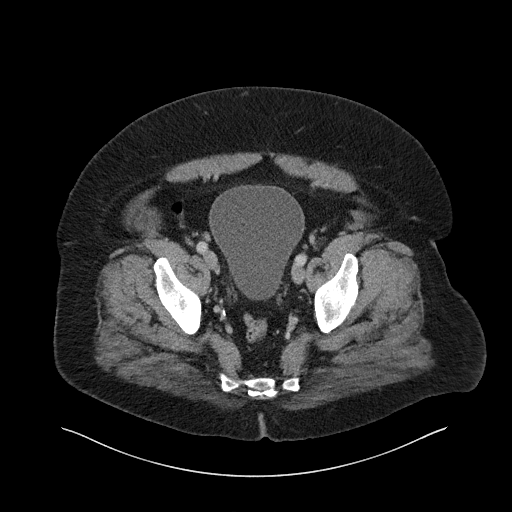
[im 30/94  soft-tissue]
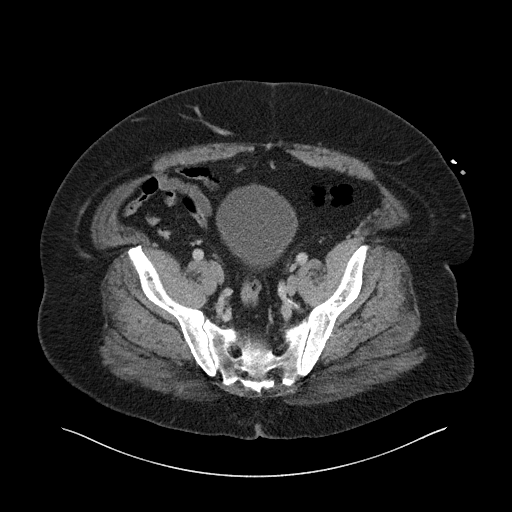
[im 40/94  soft-tissue]
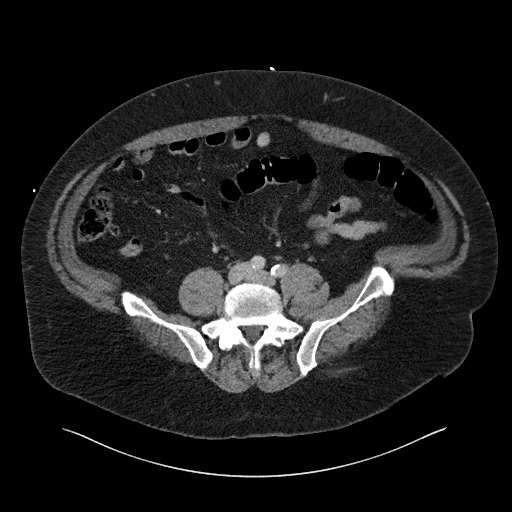
[im 45/94  soft-tissue]
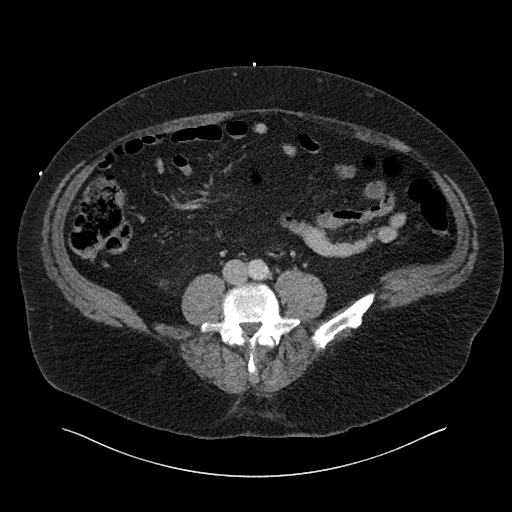
[im 49/94  soft-tissue]
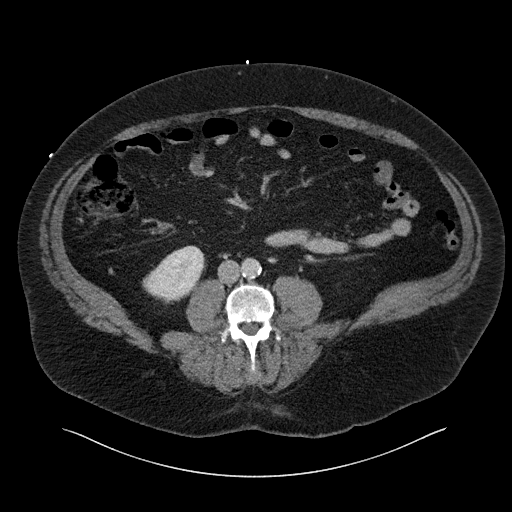
[im 54/94  soft-tissue]
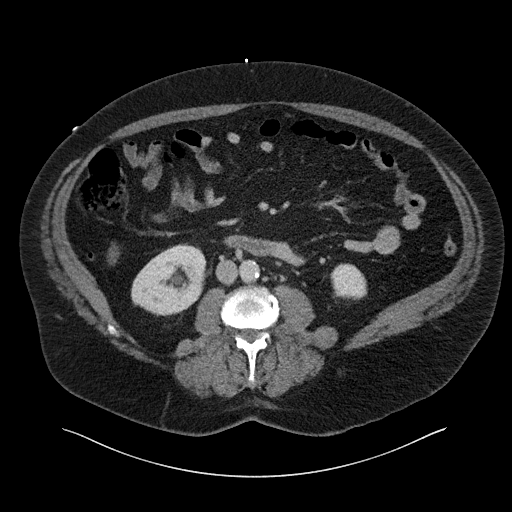
[im 54/94  bone]
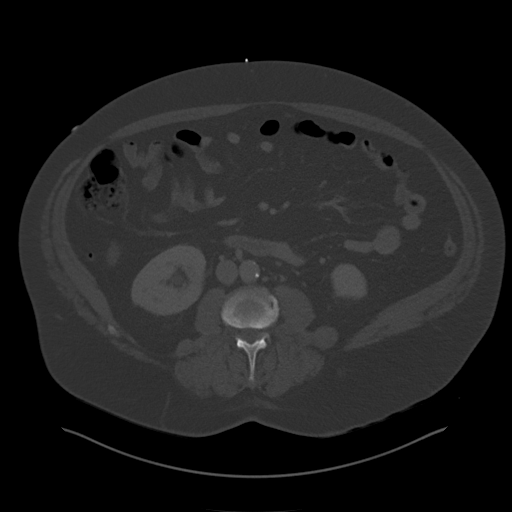
[im 64/94  soft-tissue]
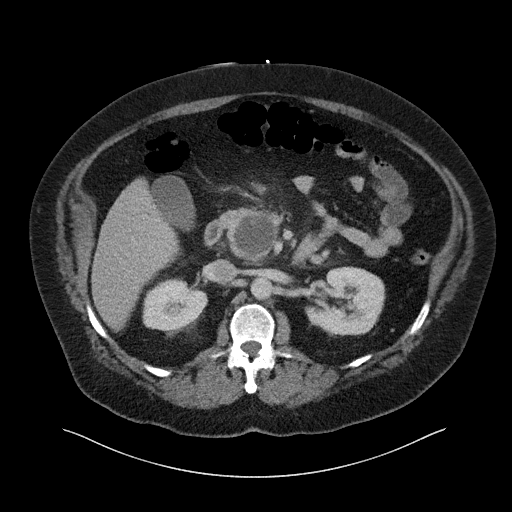
[im 69/94  soft-tissue]
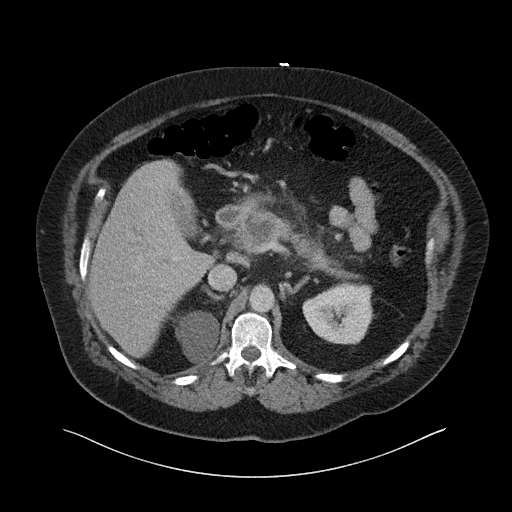
[im 74/94  soft-tissue]
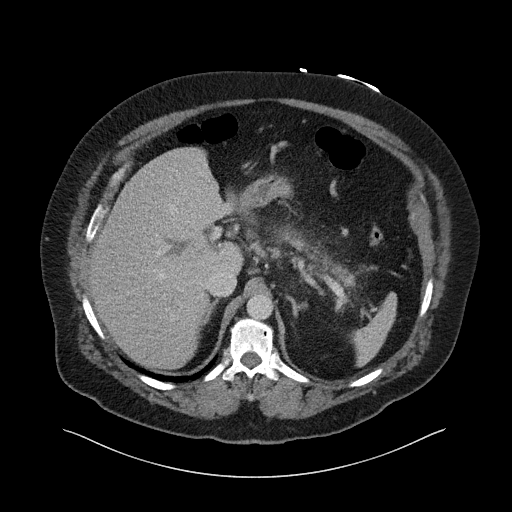
[im 84/94  soft-tissue]
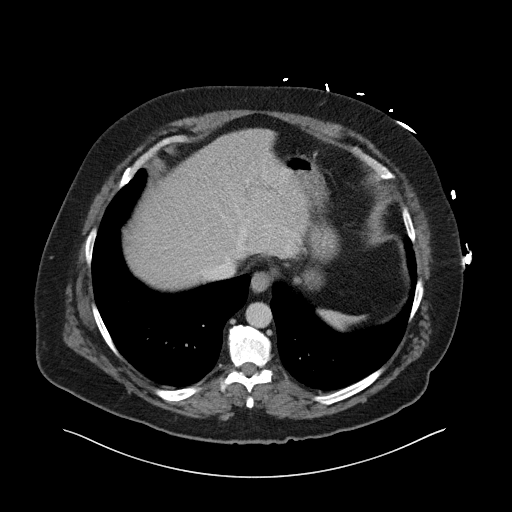
[im 89/94  soft-tissue]
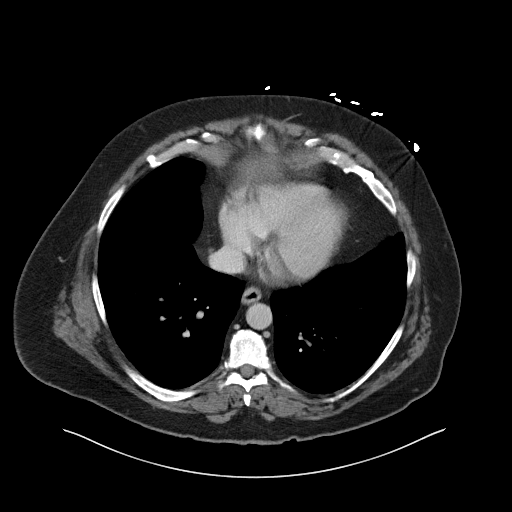

[Series 6: coronal soft tissue · coronal · 0.91mm/px · 3 of 130 slices shown]
[im 44/130  soft-tissue]
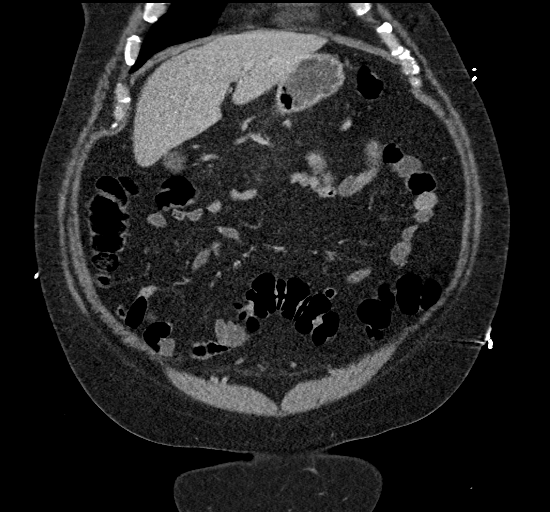
[im 58/130  soft-tissue]
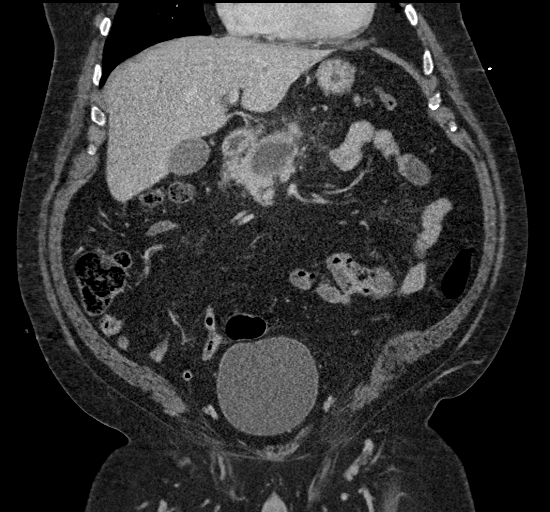
[im 72/130  soft-tissue]
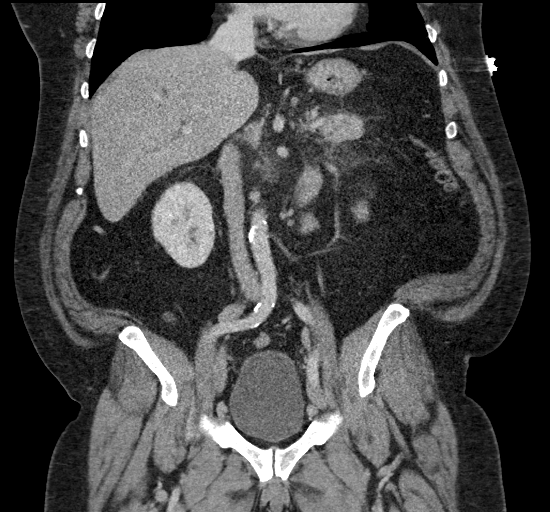

[17 of 46 positions shown; findings below may reference images not displayed]

FINDINGS: Lower Chest: No acute findings.

Hepatobiliary: No hepatic masses identified. Mild diffuse biliary
ductal dilatation is increased since prior study. Gallbladder is
unremarkable.

Pancreas: Mild acute pancreatitis shows no significant change
compared to prior study. A cystic lesion is seen in the pancreatic
head measuring 4.5 x 3.8 cm, which is stable in size and most likely
represents a pseudocyst.

Spleen: Within normal limits in size and appearance.

Adrenals/Urinary Tract: No masses identified. Right upper pole renal
cyst is stable. No evidence of ureteral calculi or hydronephrosis.
Unremarkable unopacified urinary bladder.

Stomach/Bowel: No evidence of obstruction, inflammatory process or
abnormal fluid collections. Normal appendix visualized.

Vascular/Lymphatic: Shotty sub-cm bilateral iliac lymph nodes show
no significant change. No pathologically enlarged lymph nodes. No
abdominal aortic aneurysm. Aortic atherosclerosis.

Reproductive:  No mass or other significant abnormality.

Other:  None.

Musculoskeletal:  No suspicious bone lesions identified.
IMPRESSION: 1. New mild diffuse biliary ductal dilatation.
2. No significant change in mild acute pancreatitis.
3. Stable 4.5 cm cystic lesion in pancreatic head, most likely
representing a pseudocyst.

## 2018-11-29 MED ORDER — PIPERACILLIN-TAZOBACTAM 3.375 G IVPB
3.3750 g | Freq: Three times a day (TID) | INTRAVENOUS | Status: DC
  Administered 2018-11-30 – 2018-12-05 (×15): 3.375 g via INTRAVENOUS
  Filled 2018-11-29 (×14): qty 50

## 2018-11-29 MED ORDER — PIPERACILLIN-TAZOBACTAM 3.375 G IVPB 30 MIN
3.3750 g | Freq: Once | INTRAVENOUS | Status: AC
  Administered 2018-11-29: 3.375 g via INTRAVENOUS
  Filled 2018-11-29: qty 50

## 2018-11-29 MED ORDER — IOHEXOL 300 MG/ML  SOLN
100.0000 mL | Freq: Once | INTRAMUSCULAR | Status: AC | PRN
  Administered 2018-11-29: 19:00:00 100 mL via INTRAVENOUS

## 2018-11-29 MED ORDER — HYDRALAZINE HCL 20 MG/ML IJ SOLN
5.0000 mg | INTRAMUSCULAR | Status: DC | PRN
  Administered 2018-12-03: 14:00:00 5 mg via INTRAVENOUS
  Filled 2018-11-29: qty 1

## 2018-11-29 MED ORDER — ONDANSETRON HCL 4 MG/2ML IJ SOLN: 4.0000 mg | Freq: Three times a day (TID) | INTRAMUSCULAR | Status: DC | PRN

## 2018-11-29 MED ORDER — SODIUM CHLORIDE 0.9 % IV BOLUS
1000.0000 mL | Freq: Once | INTRAVENOUS | Status: AC
  Administered 2018-11-29: 18:00:00 1000 mL via INTRAVENOUS

## 2018-11-29 NOTE — H&P (Signed)
History and Physical    Gottfried Standish WLN:[id number] DOB: 04/27/1960 DOA: 11/29/2018  Referring MD/NP/PA:   PCP: [name], MD   Patient coming from:  The patient is coming from home.  At baseline, pt is independent for most of ADL.        Chief Complaint: abnormal lab  HPI: Philip Rhodes is a 58 y.o. male with medical history significant of hypertension, hyperlipidemia, OSA on CPAP, tobacco abuse, alcohol abuse (quit 4 weeks ago), who presents with abnormal lab.  Patient was recently hospitalized from 8/27-8/21 due to acute pancreatitis. GI was consulted and etiology was not clear. Pt had MRI of the abdomen with and without contrast/MRCP which showed acute pancreatitis and complex nonenhancing cystic lesion in the pancreatic head measures 4.4 x 3.7 x 4.0 cm, and demonstrates eccentric nodular hemorrhagic/proteinaceous debris, most compatible with a pancreatic pseudocyst, stable in size since recent 11/11/2018. Pt also had CT on 8/27 scan which showed no gallstones, gallbladder wall thickening, or biliary dilatation. Pt was discharged in stable condition.  His Bp med, Prinzide was hold due to pancreatitis.  Pt states that he had follow up visit with his primary physician and had lab done today, which showed elevated bilirubin up to 10 and abnormal liver function. He therefore was sent to ED for further evaluation treatment.  Patient states that he had intermittent mild nausea and minimal abdominal discomfort, which has resolved currently.  Currently patient does not have nausea, vomiting or diarrhea.  Denies fever or chills.  Patient does not have chest pain, shortness breath, cough, symptoms of UTI or unilateral weakness.  Patient used to be a heavy drinker, but he quit drinking alcohol 4 weeks ago.  ED Course: pt was found to have WBC 14.2, abnormal liver function (ALP 256, AST 138, ALT 251, total bilirubin 10.7), lipase 288, negative COVID-19 test, negative urinalysis, INR 1.1, PTT 34,  sodium 130, renal function normal, temperature normal, blood pressure 136/78, heart rate 101, 82, tachypnea, oxygen saturation 97% on room air.  Patient is admitted to [place] bed as inpatient. Dr. Michail Sermon of GI was consulted by EDP, will see pt in AM.  CT-abd/pelvis showed: 1. New mild diffuse biliary ductal dilatation. 2. No significant change in mild acute pancreatitis. 3. Stable 4.5 cm cystic lesion in pancreatic head, most likely representing a pseudocyst.   Review of Systems:   General: no fevers, chills, no body weight gain, has poor appetite, has fatigue HEENT: no blurry vision, hearing changes or sore throat Respiratory: no dyspnea, coughing, wheezing CV: no chest pain, no palpitations GI: has nausea and mild abdominal discomfort, no vomiting, diarrhea, constipation GU: no dysuria, burning on urination, increased urinary frequency, hematuria  Ext: no leg edema Neuro: no unilateral weakness, numbness, or tingling, no vision change or hearing loss Skin: no rash, no skin tear. MSK: No muscle spasm, no deformity, no limitation of range of movement in spin Heme: No easy bruising.  Travel history: No recent long distant travel.  Allergy: No Known Allergies  Past Medical History:  Diagnosis Date   Hyperlipemia    Hypertension    Sleep apnea     History reviewed. No pertinent surgical history.  Social History:  reports that he has been smoking. He has been smoking about 1.00 pack per day. He has never used smokeless tobacco. He reports current alcohol use. He reports previous drug use.  Family History:  Family History  Problem Relation Age of Onset   Hypertension Mother  Prior to Admission medications   Medication Sig Start Date End Date Taking? Authorizing Provider  amLODipine (NORVASC) 5 MG tablet Take 5 mg by mouth daily. 11/11/18  Yes [provider]  Ascorbic Acid (VITAMIN C PO) Take 1 tablet by mouth daily.   Yes [provider]  aspirin  EC 81 MG tablet Take 81 mg by mouth at bedtime.   Yes [provider]  calcium carbonate (TUMS - DOSED IN MG ELEMENTAL CALCIUM) 500 MG chewable tablet Chew 1 tablet by mouth daily.   Yes [provider]  HYDROcodone-acetaminophen (NORCO/VICODIN) 5-325 MG tablet Take 1 tablet by mouth every 6 (six) hours as needed for pain. 11/11/18  Yes [provider]  ibuprofen (ADVIL) 200 MG tablet Take 200 mg by mouth every 6 (six) hours as needed for headache or mild pain.   Yes [provider]  lisinopril (ZESTRIL) 20 MG tablet Take 20 mg by mouth every morning. 11/16/18  Yes [provider]  Multiple Vitamins-Minerals (MULTIVITAMIN ADULT PO) Take 1 tablet by mouth daily.   Yes [provider]  Omega-3 Fatty Acids (FISH OIL PO) Take 1 tablet by mouth daily.   Yes [provider]  simvastatin (ZOCOR) 40 MG tablet Take 40 mg by mouth every evening. 09/10/18  Yes [provider]    Physical Exam: Vitals:   11/29/18 2051 11/29/18 2100 11/29/18 2115 11/29/18 2130  BP: 122/68 123/78 (!) 141/83 (!) 149/84  Pulse: 73 87 79 75  Resp: (!) 27 12 (!) 21 (!) 25  Temp:      TempSrc:      SpO2: 96% 95% 98% 98%  Weight: 127 kg     Height: 5\' 8"  (1.727 m)      General: Not in acute distress HEENT:       Eyes: PERRL, EOMI, has jaundice       ENT: No discharge from the ears and nose, no pharynx injection, no tonsillar enlargement.        Neck: No JVD, no bruit, no mass felt. Heme: No neck lymph node enlargement. Cardiac: S1/S2, RRR, No murmurs, No gallops or rubs. Respiratory: No rales, wheezing, rhonchi or rubs. GI: Soft, nondistended, nontender, no rebound pain, no organomegaly, BS present. GU: No hematuria Ext: No pitting leg edema bilaterally. 2+DP/PT pulse bilaterally. Musculoskeletal: No joint deformities, No joint redness or warmth, no limitation of ROM in spin. Skin: No rashes.  Neuro: Alert, oriented X3, cranial nerves II-XII grossly  intact, moves all extremities normally. Psych: Patient is not psychotic, no suicidal or hemocidal ideation.  Labs on Admission: I have personally reviewed following labs and imaging studies  CBC: Recent Labs  Lab 11/29/18 1514  WBC 14.2*  NEUTROABS 9.9*  HGB 15.8  HCT 44.0  MCV 92.1  PLT 406*   Basic Metabolic Panel: Recent Labs  Lab 11/29/18 1514  NA 130*  K 3.7  CL 97*  CO2 21*  GLUCOSE 141*  BUN <5*  CREATININE 0.51*  CALCIUM 9.0   GFR: Estimated Creatinine Clearance: 132.3 mL/min (A) (by C-G formula based on SCr of 0.51 mg/dL (L)). Liver Function Tests: Recent Labs  Lab 11/29/18 1514  AST 138*  ALT 251*  ALKPHOS 256*  BILITOT 10.7*  PROT 7.7  ALBUMIN 3.4*   Recent Labs  Lab 11/29/18 1514  LIPASE 288*   No results for input(s): AMMONIA in the last 168 hours. Coagulation Profile: Recent Labs  Lab 11/29/18 1831  INR 1.1   Cardiac Enzymes: No results  for input(s): CKTOTAL, CKMB, CKMBINDEX, TROPONINI in the last 168 hours. BNP (last 3 results) No results for input(s): PROBNP in the last 8760 hours. HbA1C: No results for input(s): HGBA1C in the last 72 hours. CBG: No results for input(s): GLUCAP in the last 168 hours. Lipid Profile: No results for input(s): CHOL, HDL, LDLCALC, TRIG, CHOLHDL, LDLDIRECT in the last 72 hours. Thyroid Function Tests: No results for input(s): TSH, T4TOTAL, FREET4, T3FREE, THYROIDAB in the last 72 hours. Anemia Panel: No results for input(s): VITAMINB12, FOLATE, FERRITIN, TIBC, IRON, RETICCTPCT in the last 72 hours. Urine analysis:    Component Value Date/Time   COLORURINE AMBER (A) 11/29/2018 1659   APPEARANCEUR CLEAR 11/29/2018 1659   LABSPEC 1.015 11/29/2018 1659   PHURINE 5.0 11/29/2018 1659   GLUCOSEU NEGATIVE 11/29/2018 1659   HGBUR SMALL (A) 11/29/2018 1659   BILIRUBINUR MODERATE (A) 11/29/2018 1659   KETONESUR 20 (A) 11/29/2018 1659   PROTEINUR NEGATIVE 11/29/2018 1659   NITRITE NEGATIVE 11/29/2018 1659     LEUKOCYTESUR NEGATIVE 11/29/2018 1659   Sepsis Labs: @LABRCNTIP (procalcitonin:4,lacticidven:4) ) Recent Results (from the past 240 hour(s))  SARS Coronavirus 2 [facility] order, Performed in [facility] hospital lab) Nasopharyngeal Nasopharyngeal Swab     Status: None   Collection Time: 11/29/18  5:27 PM   Specimen: Nasopharyngeal Swab  Result Value Ref Range Status   SARS Coronavirus 2 NEGATIVE NEGATIVE Final    Comment: (NOTE) If result is NEGATIVE SARS-CoV-2 target nucleic acids are NOT DETECTED. The SARS-CoV-2 RNA is generally detectable in upper and lower  respiratory specimens during the acute phase of infection. The lowest  concentration of SARS-CoV-2 viral copies this assay can detect is 250  copies / mL. A negative result does not preclude SARS-CoV-2 infection  and should not be used as the sole basis for treatment or other  patient management decisions.  A negative result may occur with  improper specimen collection / handling, submission of specimen other  than nasopharyngeal swab, presence of viral mutation(s) within the  areas targeted by this assay, and inadequate number of viral copies  (<250 copies / mL). A negative result must be combined with clinical  observations, patient history, and epidemiological information. If result is POSITIVE SARS-CoV-2 target nucleic acids are DETECTED. The SARS-CoV-2 RNA is generally detectable in upper and lower  respiratory specimens dur ing the acute phase of infection.  Positive  results are indicative of active infection with SARS-CoV-2.  Clinical  correlation with patient history and other diagnostic information is  necessary to determine patient infection status.  Positive results do  not rule out bacterial infection or co-infection with other viruses. If result is PRESUMPTIVE POSTIVE SARS-CoV-2 nucleic acids MAY BE PRESENT.   A presumptive positive result was obtained on the submitted specimen  and confirmed on repeat  testing.  While 2019 novel coronavirus  (SARS-CoV-2) nucleic acids may be present in the submitted sample  additional confirmatory testing may be necessary for epidemiological  and / or clinical management purposes  to differentiate between  SARS-CoV-2 and other Sarbecovirus currently known to infect humans.  If clinically indicated additional testing with an alternate test  methodology [phone]) is advised. The SARS-CoV-2 RNA is generally  detectable in upper and lower respiratory sp ecimens during the acute  phase of infection. The expected result is Negative. Fact Sheet for Patients:  BoilerBrush.com.cy Fact Sheet for Healthcare Providers: https://pope.com/ This test is not yet approved or cleared by the [place] FDA and has been authorized for detection  and/or diagnosis of SARS-CoV-2 by FDA under an Emergency Use Authorization (EUA).  This EUA will remain in effect (meaning this test can be used) for the duration of the COVID-19 declaration under Section 564(b)(1) of the Act, 21 U.S.C. section 360bbb-3(b)(1), unless the authorization is terminated or revoked sooner. Performed at [facility], [address] [place], [place] [address]      Radiological Exams on Admission: Ct Abdomen Pelvis W Contrast  Result Date: 11/29/2018 CLINICAL DATA:  Acute abdominal pain and nausea and vomiting. Jaundice. Elevated liver enzymes. Pancreatitis. EXAM: CT ABDOMEN AND PELVIS WITH CONTRAST TECHNIQUE: Multidetector CT imaging of the abdomen and pelvis was performed using the standard protocol following bolus administration of intravenous contrast. CONTRAST:  100mL OMNIPAQUE IOHEXOL 300 MG/ML  SOLN COMPARISON:  11/11/2018 FINDINGS: Lower Chest: No acute findings. Hepatobiliary: No hepatic masses identified. Mild diffuse biliary ductal dilatation is increased since prior study. Gallbladder is unremarkable. Pancreas: Mild acute pancreatitis shows  no significant change compared to prior study. A cystic lesion is seen in the pancreatic head measuring 4.5 x 3.8 cm, which is stable in size and most likely represents a pseudocyst. Spleen: Within normal limits in size and appearance. Adrenals/Urinary Tract: No masses identified. Right upper pole renal cyst is stable. No evidence of ureteral calculi or hydronephrosis. Unremarkable unopacified urinary bladder. Stomach/Bowel: No evidence of obstruction, inflammatory process or abnormal fluid collections. Normal appendix visualized. Vascular/Lymphatic: Shotty sub-cm bilateral iliac lymph nodes show no significant change. No pathologically enlarged lymph nodes. No abdominal aortic aneurysm. Aortic atherosclerosis. Reproductive:  No mass or other significant abnormality. Other:  None. Musculoskeletal:  No suspicious bone lesions identified. IMPRESSION: 1. New mild diffuse biliary ductal dilatation. 2. No significant change in mild acute pancreatitis. 3. Stable 4.5 cm cystic lesion in pancreatic head, most likely representing a pseudocyst. Electronically Signed   By: Danae OrleansJohn A Stahl M.D.   On: 11/29/2018 19:43     EKG:   Not done in ED, will get one.   Assessment/Plan Principal Problem:   Elevated LFTs Active Problems:   Essential hypertension   Pancreatitis   Hyponatremia   SIRS (systemic inflammatory response syndrome) (HCC)   Hyperbilirubinemia   Tobacco abuse   History of alcohol abuse   Elevated LFTs and hyperbilirubinemia: Etiology is not clear. Pt had abnormal liver function in previous admission (ALP 90, AST 154, ALT 238, total bilirubin 7.9 on 8/29) --> improved on 8/31 (ALP 104, AST 73, ALT 177, total bilirubin 2.1) --> today bnormal liver function (ALP 256, AST 138, ALT 251, total bilirubin 10.7), lipase 288. Dr. Bosie ClosSchooler of GI was consulted, who recommended CT scan, which was done in ED, and showed new mild diffuse biliary ductal dilatation, no significant change in mild acute pancreatitis,  stable 4.5 cm cystic lesion in pancreatic head, most likely representing a pseudocyst.  Patient does not have fever, but has leukocytosis with WBC 14.2.  Will start empiric antibiotics with Zosyn now.   -will admit to med-surg bed as inpt -IV zosyn  -get Blood culture -prn zofran for nausea and vomiting -PRN oxycodone for pain -Avoid using liver toxic medication, such as Tylenol, home Norco -NPO after MN -IVF: 1L NS in ED, then 75 cc/h -f/u GI further recommendations  Recent Labs  Lab 11/29/18 1514 11/29/18 1831  AST 138*  --   ALT 251*  --   ALKPHOS 256*  --   BILITOT 10.7*  --   PROT 7.7  --   ALBUMIN 3.4*  --  INR  --  1.1   SIRS (systemic inflammatory response syndrome) (HCC) vs. sepsis: Patient does not have fever, but he has leukocytosis with WBC 14.2, initially tachycardia, currently tachypnea, meet criteria for SIRS. -started IV Zosyn -will get Procalcitonin and trend lactic acid levels per sepsis protocol. -IVF: 1L of NS bolus in ED, followed by 75 cc/h   Pancreatitis: lipase 288. CT scan showed new mild diffuse biliary ductal dilatation, no significant change in mild acute pancreatitis, stable 4.5 cm cystic lesion in pancreatic head, most likely representing a pseudocyst.   -Supportive care -IV fluid as above - PRN Zofran for nausea, oxycodone for pain.  Essential hypertension: -continue to hold prinzide -Continue amlodipine -IV hydralazine as needed  Hyponatremia: Na 130.  Mental status normal.  Likely due to poor oral intake. -IV fluid of NS as above -f/u by CMP  Tobacco abuse and hx of alcohol abuse: Patient used to be a heavy drinker, but he quit drinking alcohol 4 weeks ago. No signs of alcohol withdrawal. -Did counseling about importance of quitting smoking -Nicotine patch   Inpatient status:  # Patient requires inpatient status due to high intensity of service, high risk for further deterioration and high frequency of surveillance required.  I  certify that at the point of admission it is my clinical judgment that the patient will require inpatient hospital care spanning beyond 2 midnights from the point of admission.   This patient has multiple chronic comorbidities including hypertension, hyperlipidemia, OSA on CPAP, tobacco abuse, alcohol abuse (quit 4 weeks ago), who presents with abnormal lab.  Now patient has presenting with worsening abnormal liver function and hyperbilirubinemia  The worrisome physical exam findings include jaundice.  The initial radiographic and laboratory data are worrisome because of worsening abnormal liver function and hyperbilirubinemia.  Leukocytosis.  CT scan showed new mild diffuse biliary ductal dilatation.  Current medical needs: please see my assessment and plan  Predictability of an adverse outcome (risk): Patient has multiple comorbidities, now presents with worsening abnormal liver function and hyperbilirubinemia.  CT scan showed new mild diffuse biliary ductal dilation indicating possible obstruction.  Patient may need ERCP procedure. Pt was just discharged on 8/31 due to pancreatitis with unclear etiology.  His presentation is highly complicated and will need more work-up.  Patient is at high risk for deteriorating.  Will need to be treated in the hospital for at least 2 days.     DVT ppx: SCD Code Status: Full code Family Communication: Yes, patient's wife   at bed side Disposition Plan:  Anticipate discharge back to previous home environment Consults called:  Dr. Bosie ClosSchooler Admission status:  medical floor/inpt  Date of Service 11/29/2018    Lorretta HarpXilin Trentin Knappenberger [facility]   If 7PM-7AM, please contact night-coverage [email or web address] Password [email or web address] 11/29/2018, 10:21 PM

## 2018-11-29 NOTE — Progress Notes (Signed)
Pharmacy Antibiotic Note  Philip Rhodes is a 58 y.o. male admitted on 11/29/2018 with [place] infection. Recently hospitalized from 8/27-8/21 due to acute pancreatitis. Afebrile, WBC 14.2, Scr 0.51. Pharmacy has been consulted for zosyn dosing.  Plan: Zosyn 3.375 g IV over 30 mins once Zosyn 3.375 g IV q8h Monitor renal function Monitor C/s   Temp (24hrs), Avg:98.3 F (36.8 C), Min:98.3 F (36.8 C), Max:98.3 F (36.8 C)  Recent Labs  Lab 11/29/18 1514  WBC 14.2*  CREATININE 0.51*    Estimated Creatinine Clearance: 132.3 mL/min (A) (by C-G formula based on SCr of 0.51 mg/dL (L)).    No Known Allergies  Antimicrobials this admission: Zosyn 9/14 >>   Microbiology results: 9/14 COVID: negative   Thank you for allowing pharmacy to be a part of this patient's care.  Lorel Monaco, PharmD PGY1 Ambulatory Care Resident [employer] # [phone]

## 2018-11-29 NOTE — ED Provider Notes (Signed)
[facility] EMERGENCY DEPARTMENT Provider Note   CSN: [id number] Arrival date & time: 11/29/18  1452     History   Chief Complaint No chief complaint on file.   HPI Philip Rhodes is a 58 y.o. male with history of hypertension, healthy bowel, pancreatitis, alcohol abuse (quit 4 weeks ago) who presents with elevated bilirubin and LFTs.  He had some abdominal pain and nausea over the last few days, but denies any now.  Patient denies any fever, chest pain, shortness of breath, vomiting, diarrhea, bloody stools, urinary symptoms, back pain.  Patient took Tums at home which helped his symptoms earlier.     HPI  Past Medical History:  Diagnosis Date  . Hyperlipemia   . Hypertension   . Sleep apnea     Patient Active Problem List   Diagnosis Date Noted  . Hyponatremia 11/29/2018  . SIRS (systemic inflammatory response syndrome) ([facility]) 11/29/2018  . Hyperbilirubinemia   . Pancreatitis 11/12/2018  . Acute pancreatitis 11/11/2018  . Essential hypertension 11/11/2018  . Elevated LFTs 11/11/2018    History reviewed. No pertinent surgical history.      Home Medications    Prior to Admission medications   Medication Sig Start Date End Date Taking? Authorizing Provider  amLODipine (NORVASC) 5 MG tablet Take 5 mg by mouth daily. 11/11/18  Yes [provider]  Ascorbic Acid (VITAMIN C PO) Take 1 tablet by mouth daily.   Yes [provider]  aspirin EC 81 MG tablet Take 81 mg by mouth at bedtime.   Yes [provider]  calcium carbonate (TUMS - DOSED IN MG ELEMENTAL CALCIUM) 500 MG chewable tablet Chew 1 tablet by mouth daily.   Yes [provider]  HYDROcodone-acetaminophen (NORCO/VICODIN) 5-325 MG tablet Take 1 tablet by mouth every 6 (six) hours as needed for pain. 11/11/18  Yes [provider]  ibuprofen (ADVIL) 200 MG tablet Take 200 mg by mouth every 6 (six) hours as needed for headache or mild pain.   Yes [provider]  lisinopril (ZESTRIL) 20 MG tablet Take 20 mg by mouth every morning. 11/16/18  Yes [provider]  Multiple Vitamins-Minerals (MULTIVITAMIN ADULT PO) Take 1 tablet by mouth daily.   Yes [provider]  Omega-3 Fatty Acids (FISH OIL PO) Take 1 tablet by mouth daily.   Yes [provider]  simvastatin (ZOCOR) 40 MG tablet Take 40 mg by mouth every evening. 09/10/18  Yes [provider]    Family History Family History  Problem Relation Age of Onset  . Hypertension Mother     Social History Social History   Tobacco Use  . Smoking status: Current Every Day Smoker    Packs/day: 1.00  . Smokeless tobacco: Never Used  Substance Use Topics  . Alcohol use: Yes    Comment: weekends  . Drug use: Not Currently     Allergies   Patient has no known allergies.   Review of Systems Review of Systems  Constitutional: Negative for chills and fever.  HENT: Negative for facial swelling and sore throat.   Respiratory: Negative for shortness of breath.   Cardiovascular: Negative for chest pain.  Gastrointestinal: Positive for abdominal pain and nausea. Negative for vomiting.  Genitourinary: Negative for dysuria.  Musculoskeletal: Negative for back pain.  Skin: Positive for color change. Negative for rash and wound.  Neurological: Negative for headaches.  Psychiatric/Behavioral: The patient is not nervous/anxious.      Physical Exam Updated Vital Signs  BP 136/78 (BP Location: Right Arm)   Pulse 82   Temp 98.3 F (36.8 C) (Oral)   Resp (!) 37   SpO2 97%   Physical Exam Vitals signs and nursing note reviewed.  Constitutional:      General: He is not in acute distress.    Appearance: He is well-developed. He is not diaphoretic.  HENT:     Head: Normocephalic and atraumatic.     Mouth/Throat:     Pharynx: No oropharyngeal exudate.  Eyes:     General: Scleral icterus present.        Right eye: No discharge.        Left eye: No  discharge.     Conjunctiva/sclera: Conjunctivae normal.     Pupils: Pupils are equal, round, and reactive to light.  Neck:     Musculoskeletal: Normal range of motion and neck supple.     Thyroid: No thyromegaly.  Cardiovascular:     Rate and Rhythm: Normal rate and regular rhythm.     Heart sounds: Normal heart sounds. No murmur. No friction rub. No gallop.   Pulmonary:     Effort: Pulmonary effort is normal. No respiratory distress.     Breath sounds: Normal breath sounds. No stridor. No wheezing or rales.  Abdominal:     General: Bowel sounds are normal. There is no distension.     Palpations: Abdomen is soft.     Tenderness: There is no abdominal tenderness. There is no guarding or rebound.  Lymphadenopathy:     Cervical: No cervical adenopathy.  Skin:    General: Skin is warm and dry.     Coloration: Skin is jaundiced. Skin is not pale.     Findings: No rash.  Neurological:     Mental Status: He is alert.     Coordination: Coordination normal.      ED Treatments / Results  Labs (all labs ordered are listed, but only abnormal results are displayed) Labs Reviewed  COMPREHENSIVE METABOLIC PANEL - Abnormal; Notable for the following components:      Result Value   Sodium 130 (*)    Chloride 97 (*)    CO2 21 (*)    Glucose, Bld 141 (*)    BUN <5 (*)    Creatinine, Ser 0.51 (*)    Albumin 3.4 (*)    AST 138 (*)    ALT 251 (*)    Alkaline Phosphatase 256 (*)    Total Bilirubin 10.7 (*)    All other components within normal limits  LIPASE, BLOOD - Abnormal; Notable for the following components:   Lipase 288 (*)    All other components within normal limits  CBC WITH DIFFERENTIAL/PLATELET - Abnormal; Notable for the following components:   WBC 14.2 (*)    Platelets 406 (*)    Neutro Abs 9.9 (*)    Monocytes Absolute 1.3 (*)    All other components within normal limits  URINALYSIS, ROUTINE W REFLEX MICROSCOPIC - Abnormal; Notable for the following components:    Color, Urine AMBER (*)    Hgb urine dipstick SMALL (*)    Bilirubin Urine MODERATE (*)    Ketones, ur 20 (*)    Bacteria, UA RARE (*)    All other components within normal limits  SARS CORONAVIRUS 2 (HOSPITAL ORDER, [facility])  PROTIME-INR  APTT    EKG None  Radiology Ct Abdomen Pelvis W Contrast  Result Date: 11/29/2018 CLINICAL DATA:  Acute abdominal  pain and nausea and vomiting. Jaundice. Elevated liver enzymes. Pancreatitis. EXAM: CT ABDOMEN AND PELVIS WITH CONTRAST TECHNIQUE: Multidetector CT imaging of the abdomen and pelvis was performed using the standard protocol following bolus administration of intravenous contrast. CONTRAST:  148m OMNIPAQUE IOHEXOL 300 MG/ML  SOLN COMPARISON:  11/11/2018 FINDINGS: Lower Chest: No acute findings. Hepatobiliary: No hepatic masses identified. Mild diffuse biliary ductal dilatation is increased since prior study. Gallbladder is unremarkable. Pancreas: Mild acute pancreatitis shows no significant change compared to prior study. A cystic lesion is seen in the pancreatic head measuring 4.5 x 3.8 cm, which is stable in size and most likely represents a pseudocyst. Spleen: Within normal limits in size and appearance. Adrenals/Urinary Tract: No masses identified. Right upper pole renal cyst is stable. No evidence of ureteral calculi or hydronephrosis. Unremarkable unopacified urinary bladder. Stomach/Bowel: No evidence of obstruction, inflammatory process or abnormal fluid collections. Normal appendix visualized. Vascular/Lymphatic: Shotty sub-cm bilateral iliac lymph nodes show no significant change. No pathologically enlarged lymph nodes. No abdominal aortic aneurysm. Aortic atherosclerosis. Reproductive:  No mass or other significant abnormality. Other:  None. Musculoskeletal:  No suspicious bone lesions identified. IMPRESSION: 1. New mild diffuse biliary ductal dilatation. 2. No significant change in mild acute pancreatitis. 3.  Stable 4.5 cm cystic lesion in pancreatic head, most likely representing a pseudocyst. Electronically Signed   By: JMarlaine HindM.D.   On: 11/29/2018 19:43    Procedures Procedures (including critical care time)  Medications Ordered in ED Medications  ondansetron (ZOFRAN) injection 4 mg (has no administration in time range)  hydrALAZINE (APRESOLINE) injection 5 mg (has no administration in time range)  piperacillin-tazobactam (ZOSYN) IVPB 3.375 g (has no administration in time range)  sodium chloride 0.9 % bolus 1,000 mL (1,000 mLs Intravenous New Bag/Given 11/29/18 1733)  iohexol (OMNIPAQUE) 300 MG/ML solution 100 mL (100 mLs Intravenous Contrast Given 11/29/18 1907)     Initial Impression / Assessment and Plan / ED Course  I have reviewed the triage vital signs and the nursing notes.  Pertinent labs & imaging results that were available during my care of the patient were reviewed by me and considered in my medical decision making (see chart for details).  Clinical Course as of Nov 28 2036  Mon Nov 29, 2018  2003 After my first evaluation of the patient, I discussed patient case with GI specialist on-call, Dr. SMichail Sermon who advised repeat CT abdomen pelvis with contrast and admission to medicine.  GI will see the patient tomorrow morning.   [AL]    Clinical Course User Index [AL] LFrederica Kuster PA-C       Patient presenting with abdominal pain and nausea that is now resolved, jaundice, scleral icterus.  He is found to have bilirubin 10.7, AST 138, ALT 251, alk phos 256, lipase 288.  Patient has not drank alcohol in 4 weeks.  Leukocytosis 14.2.  CT abdomen pelvis shows a new mild diffuse biliary ductal dilatation.  GI will see the patient tomorrow morning.  I discussed patient case [facility]with [facility].  I appreciate the above consultants for their assistance with the patient.  Patient also evaluated by my attending, Dr. RRalene Bathe who guided the  patient's management and agrees with plan.  Final Clinical Impressions(s) / ED Diagnoses   Final diagnoses:  Elevated LFTs  Hyperbilirubinemia    ED Discharge Orders    None       LFrederica Kuster PA-C 11/29/18 2152  Quintella Reichert, MD 12/01/18 1119

## 2018-11-29 NOTE — ED Triage Notes (Signed)
Pt was told to come back to the ED today since his Bilirubin was up to 10 , pt was in the hospital last week , high was 7.9

## 2018-11-30 ENCOUNTER — Inpatient Hospital Stay (HOSPITAL_COMMUNITY): Payer: 59

## 2018-11-30 DIAGNOSIS — F1011 Alcohol abuse, in remission: Secondary | ICD-10-CM

## 2018-11-30 LAB — COMPREHENSIVE METABOLIC PANEL
ALT: 213 U/L — ABNORMAL HIGH (ref 0–44)
AST: 112 U/L — ABNORMAL HIGH (ref 15–41)
Albumin: 3 g/dL — ABNORMAL LOW (ref 3.5–5.0)
Alkaline Phosphatase: 218 U/L — ABNORMAL HIGH (ref 38–126)
Anion gap: 12 (ref 5–15)
BUN: 5 mg/dL — ABNORMAL LOW (ref 6–20)
CO2: 20 mmol/L — ABNORMAL LOW (ref 22–32)
Calcium: 8.6 mg/dL — ABNORMAL LOW (ref 8.9–10.3)
Chloride: 103 mmol/L (ref 98–111)
Creatinine, Ser: 0.56 mg/dL — ABNORMAL LOW (ref 0.61–1.24)
GFR calc Af Amer: >60 mL/min (ref >60)
GFR calc non Af Amer: >60 mL/min (ref >60)
Glucose, Bld: 130 mg/dL — ABNORMAL HIGH (ref 70–99)
Potassium: 4 mmol/L (ref 3.5–5.1)
Sodium: 135 mmol/L (ref 135–145)
Total Bilirubin: 11.5 mg/dL — ABNORMAL HIGH (ref 0.3–1.2)
Total Protein: 6.9 g/dL (ref 6.5–8.1)

## 2018-11-30 LAB — PROTIME-INR
INR: 1.1 (ref 0.8–1.2)
Prothrombin Time: 14.3 seconds (ref 11.4–15.2)

## 2018-11-30 LAB — PROCALCITONIN: Procalcitonin: <0.1 ng/mL

## 2018-11-30 LAB — CBC
HCT: 42.7 % (ref 39.0–52.0)
Hemoglobin: 14.8 g/dL (ref 13.0–17.0)
MCH: 32.7 pg (ref 26.0–34.0)
MCHC: 34.7 g/dL (ref 30.0–36.0)
MCV: 94.3 fL (ref 80.0–100.0)
Platelets: 401 10*3/uL — ABNORMAL HIGH (ref 150–400)
RBC: 4.53 MIL/uL (ref 4.22–5.81)
RDW: 13.5 % (ref 11.5–15.5)
WBC: 12.8 10*3/uL — ABNORMAL HIGH (ref 4.0–10.5)
nRBC: 0 % (ref 0.0–0.2)

## 2018-11-30 LAB — BILIRUBIN, DIRECT: Bilirubin, Direct: 7.3 mg/dL — ABNORMAL HIGH (ref 0.0–0.2)

## 2018-11-30 LAB — CBG MONITORING, ED: Glucose-Capillary: 118 mg/dL — ABNORMAL HIGH (ref 70–99)

## 2018-11-30 IMAGING — MR MR MRCP
11 of 12 series · 44 of 48 positions shown · non-contrast
Comparison: CT on [DATE]

CLINICAL DATA: Abdominal pain. Nausea and vomiting. Jaundice. Acute
pancreatitis with pseudocyst.

EXAM:
MRI ABDOMEN WITHOUT CONTRAST  (INCLUDING MRCP)
TECHNIQUE: Multiplanar multisequence MR imaging of the abdomen was performed.
Heavily T2-weighted images of the biliary and pancreatic ducts were
obtained, and three-dimensional MRCP images were rendered by post
processing.

[Series 4: ax haste · axial · 6.0mm · 1.19mm/px · z∈[-225,+41]mm · 3 of 38 slices shown]
[im 1/38]
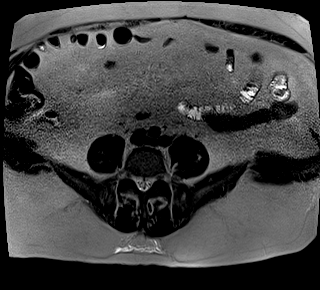
[im 19/38]
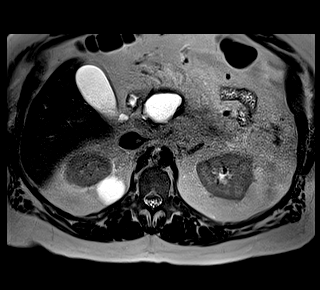
[im 38/38]
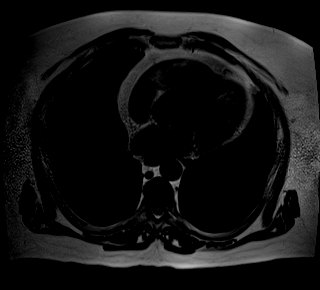

[Series 5: bSSFP · coronal · 6.0mm · 0.78mm/px · 2 of 38 slices shown]
[im 1/38]
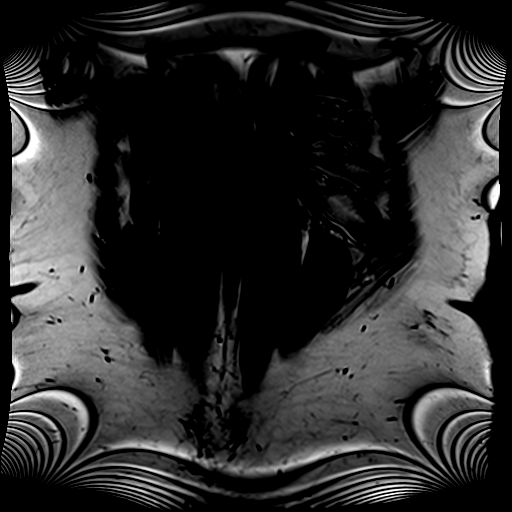
[im 38/38]
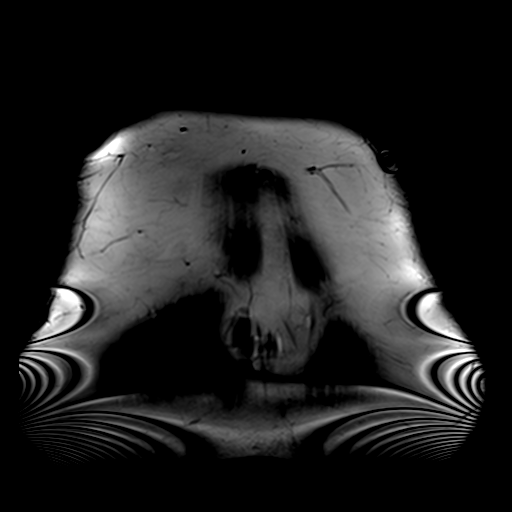

[Series 6: T2 fat-sat · axial · 6.0mm · 1.19mm/px · z∈[-225,+41]mm · 2 of 38 slices shown]
[im 1/38]
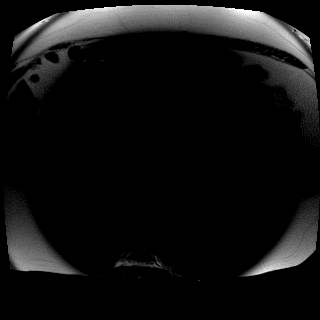
[im 38/38]
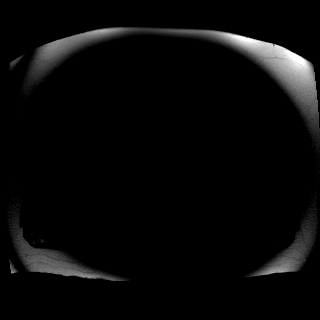

[Series 7: DWI · axial · 6.0mm · 1.42mm/px · z∈[-225,+41]mm · 7 of 114 slices shown (1 of 2)]
[im 1/114]
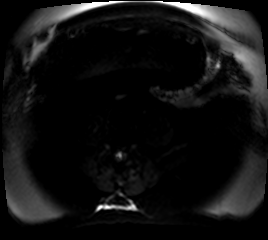
[im 19/114]
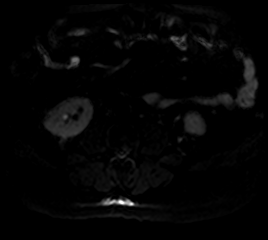
[im 38/114]
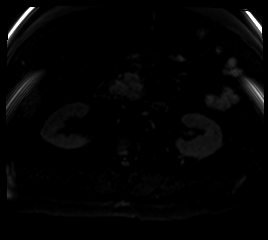
[im 57/114]
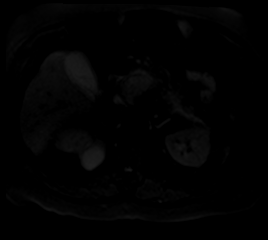
[im 76/114]
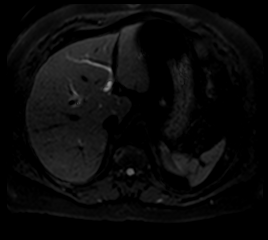
[im 95/114]
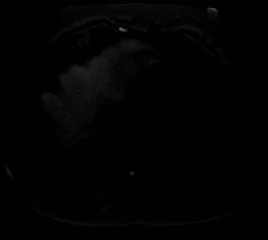
[im 114/114]
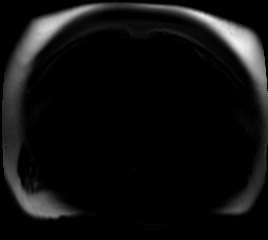

[Series 8: DWI · axial · 6.0mm · 1.42mm/px · z∈[-225,+41]mm · 2 of 38 slices shown (2 of 2)]
[im 1/38]
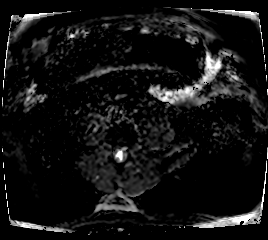
[im 38/38]
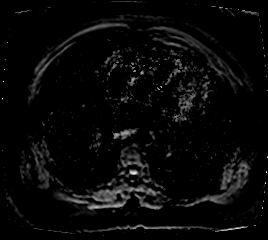

[Series 11: MRCP · coronal · 4.0mm · 1.19mm/px · 1 of 18 slices shown]
[im 1/18]
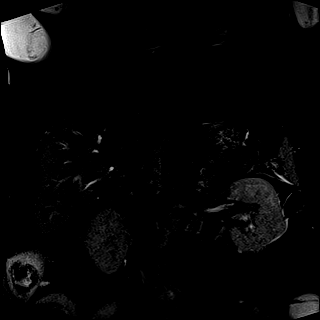

[Series 12: radials · coronal · 50.0mm · 0.99mm/px · 1 of 5 slices shown]
[im 1/5]
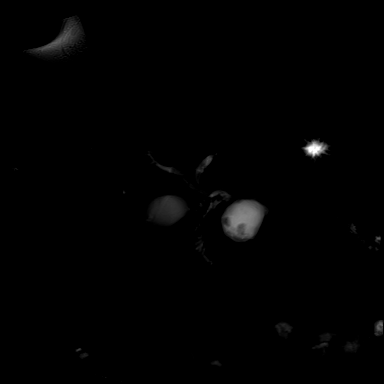

[Series 13: ax in and · axial · 3.0mm · 1.25mm/px · z∈[-232,+29]mm · 5 of 88 slices shown (1 of 2)]
[im 1/88]
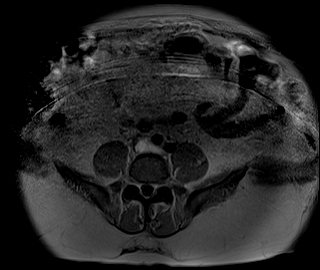
[im 22/88]
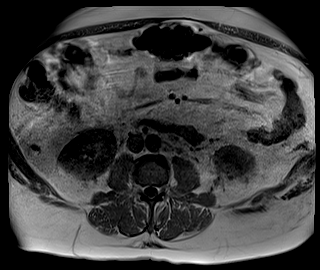
[im 44/88]
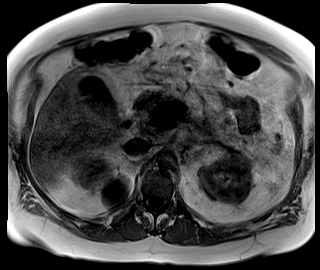
[im 66/88]
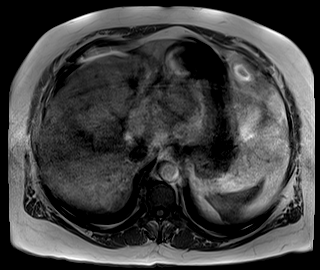
[im 88/88]
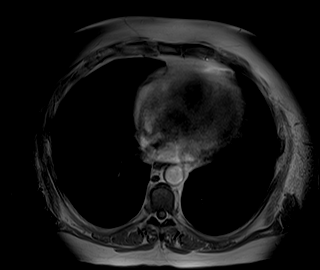

[Series 13: ax in and · axial · 3.0mm · 1.25mm/px · z∈[-232,+29]mm · 11 of 176 slices shown (2 of 2)]
[im 1/176]
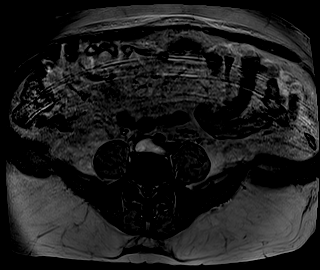
[im 18/176]
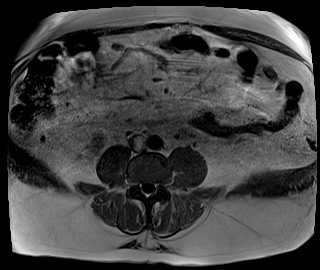
[im 36/176]
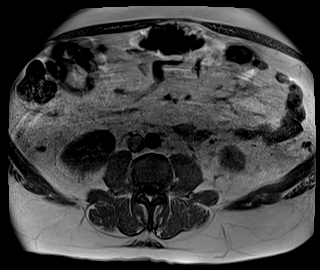
[im 53/176]
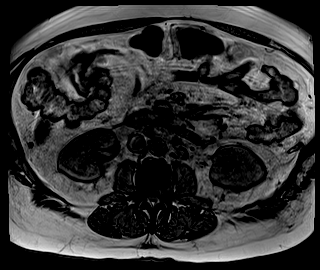
[im 71/176]
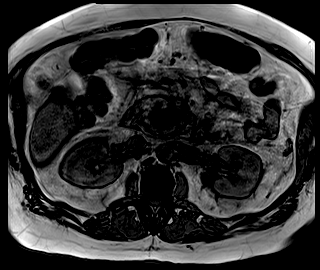
[im 88/176]
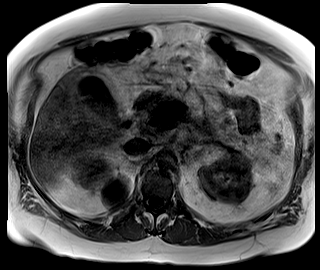
[im 106/176]
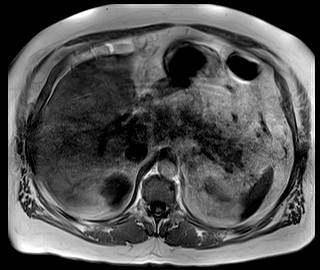
[im 123/176]
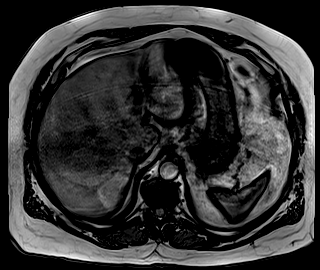
[im 141/176]
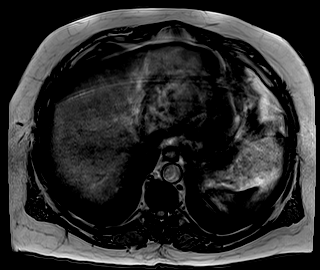
[im 158/176]
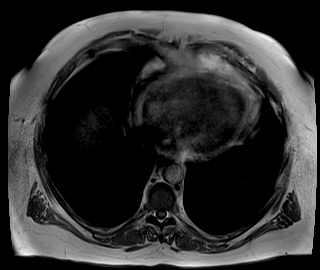
[im 176/176]
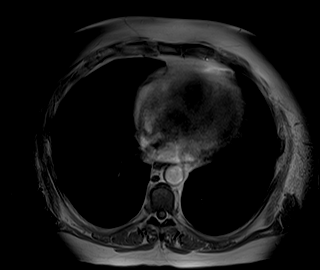

[Series 13: ax out of · axial · 3.0mm · 1.25mm/px · z∈[-232,+29]mm · 5 of 88 slices shown]
[im 1/88]
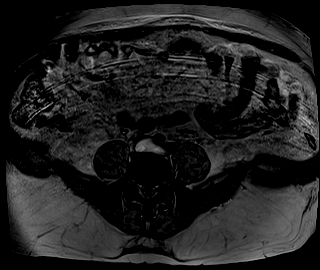
[im 22/88]
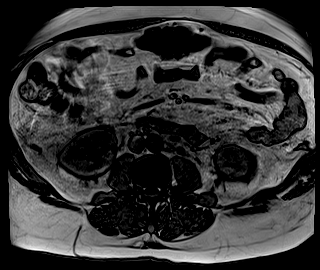
[im 44/88]
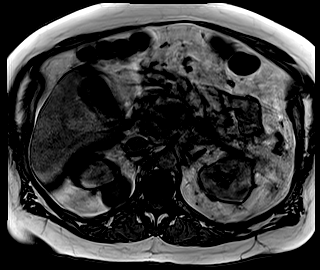
[im 66/88]
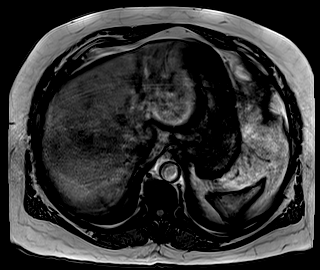
[im 88/88]
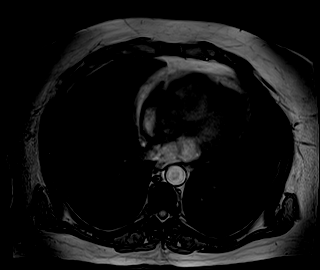

[Series 14: T1 dynamic · axial · non-contrast · 3.0mm · 1.19mm/px · z∈[-232,+29]mm · 5 of 88 slices shown]
[im 1/88]
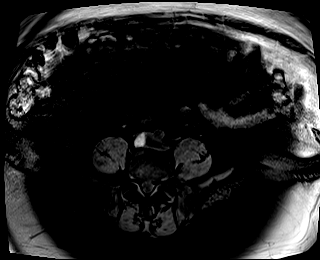
[im 22/88]
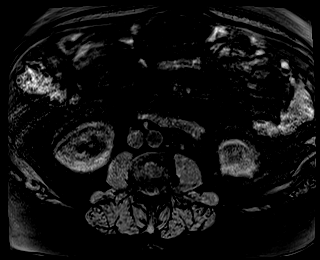
[im 44/88]
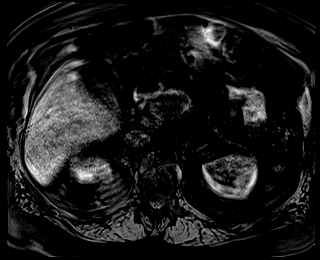
[im 66/88]
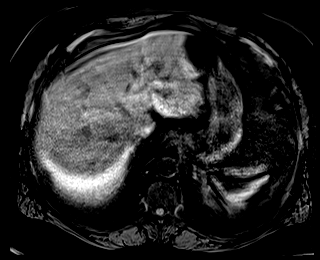
[im 88/88]
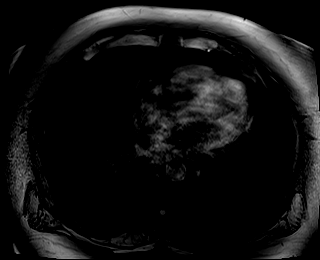

[44 of 48 positions shown; findings below may reference images not displayed]

FINDINGS: Lower chest: No acute findings.

Hepatobiliary: No masses visualized on this unenhanced exam.
Gallbladder is unremarkable. Mild diffuse biliary ductal dilatation
is again seen, with common bile duct measuring approximately 8 mm.
No evidence of choledocholithiasis. Smoothly tapered stricture of
the distal common bile duct is seen adjacent to cystic lesion in the
pancreatic head.

Pancreas: Mild acute pancreatitis is stable in appearance since
previous study. A well-circumscribed cystic lesion is again seen in
the pancreatic head, measuring 4.5 x 4.1 cm, also stable. This is
most consistent with a pseudocyst. Beaded appearance of the main
pancreatic duct is noted, which is suspicious for chronic
pancreatitis.

Spleen:  Within normal limits in size.

Adrenals/Urinary tract: Stable approximately 5 cm benign-appearing
cyst in upper pole of right kidney. No evidence of hydronephrosis.

Stomach/Bowel: Visualized portion unremarkable.

Vascular/Lymphatic: No pathologically enlarged lymph nodes
identified. No evidence of abdominal aortic aneurysm.

Other:  None.

Musculoskeletal:  No suspicious bone lesions identified.
IMPRESSION: 1. Stable mild acute pancreatitis, superimposed on changes of
chronic pancreatitis.
2. Stable 4.5 cm cystic lesion in pancreatic head, most consistent
with pseudocyst.
3. Stable mild biliary ductal dilatation, with smoothly tapered
stricture in the distal common bile duct adjacent to cystic
pancreatic lesion. No evidence of choledocholithiasis.

## 2018-11-30 IMAGING — MR MR 3D RECON AT [facility]
10 of 11 series · 42 of 48 positions shown · non-contrast
Comparison: CT on [DATE]

CLINICAL DATA: Abdominal pain. Nausea and vomiting. Jaundice. Acute
pancreatitis with pseudocyst.

EXAM:
MRI ABDOMEN WITHOUT CONTRAST  (INCLUDING MRCP)
TECHNIQUE: Multiplanar multisequence MR imaging of the abdomen was performed.
Heavily T2-weighted images of the biliary and pancreatic ducts were
obtained, and three-dimensional MRCP images were rendered by post
processing.

[Series 4: ax haste · axial · 6.0mm · 1.19mm/px · z∈[-225,+41]mm · 2 of 38 slices shown]
[im 1/38]
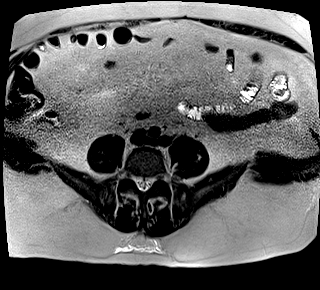
[im 38/38]
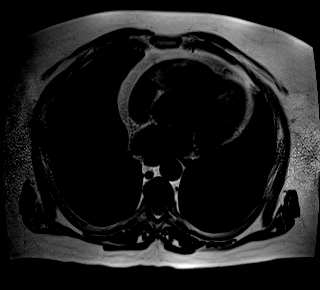

[Series 5: bSSFP · coronal · 6.0mm · 0.78mm/px · 2 of 38 slices shown]
[im 1/38]
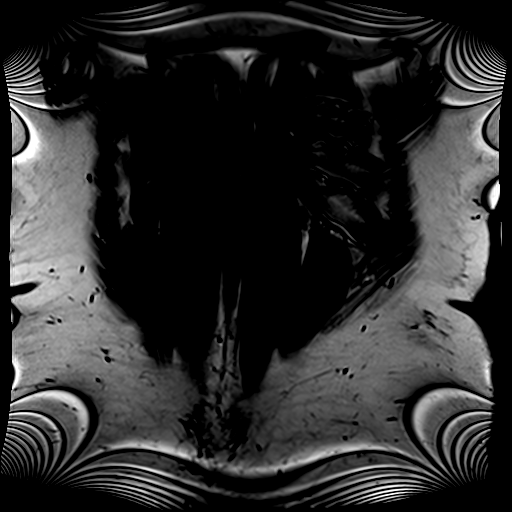
[im 38/38]
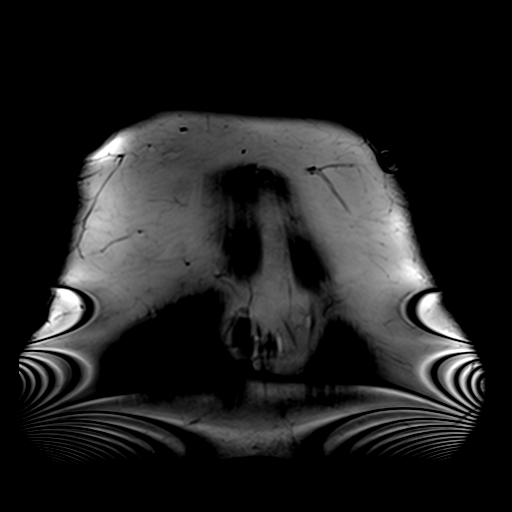

[Series 6: T2 fat-sat · axial · 6.0mm · 1.19mm/px · z∈[-225,+41]mm · 3 of 38 slices shown]
[im 1/38]
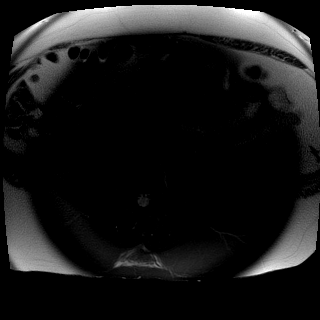
[im 19/38]
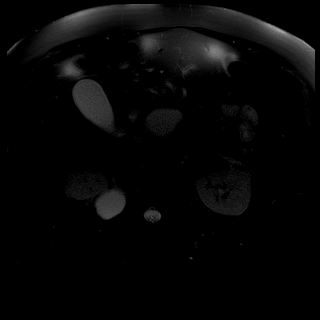
[im 38/38]
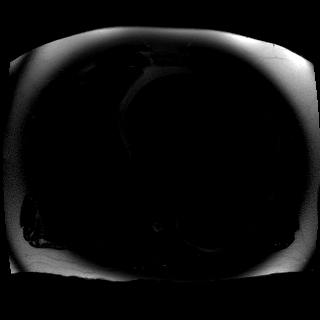

[Series 7: DWI · axial · 6.0mm · 1.42mm/px · z∈[-225,+41]mm · 9 of 114 slices shown (1 of 2)]
[im 1/114]
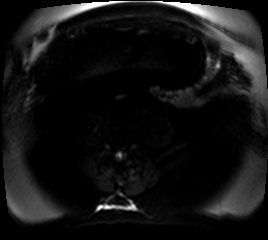
[im 15/114]
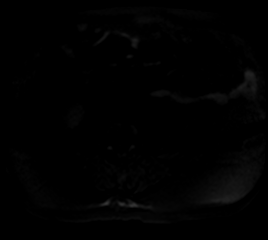
[im 29/114]
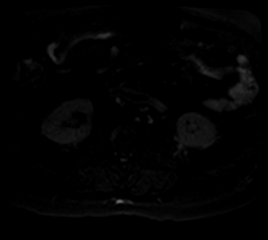
[im 43/114]
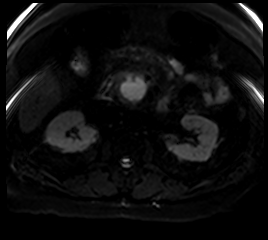
[im 57/114]
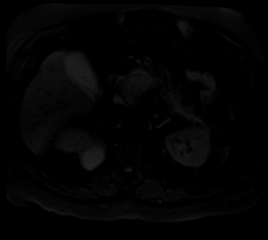
[im 71/114]
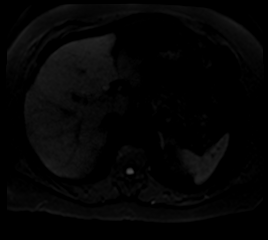
[im 85/114]
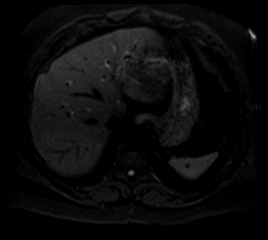
[im 99/114]
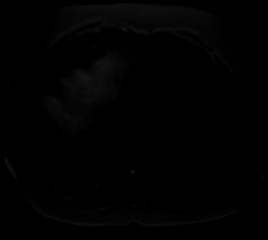
[im 114/114]
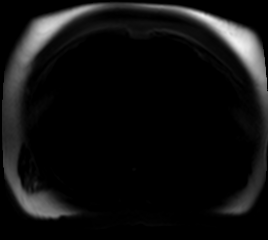

[Series 8: DWI · axial · 6.0mm · 1.42mm/px · z∈[-225,+41]mm · 3 of 38 slices shown (2 of 2)]
[im 1/38]
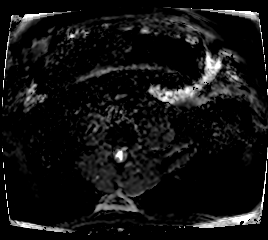
[im 19/38]
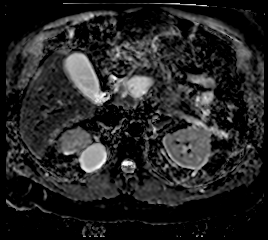
[im 38/38]
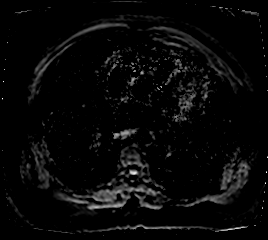

[Series 11: MRCP · coronal · 4.0mm · 1.19mm/px · 1 of 18 slices shown]
[im 1/18]
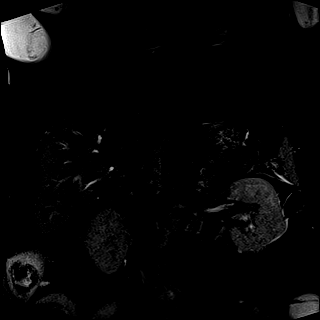

[Series 12: radials · coronal · 50.0mm · 0.99mm/px · 1 of 5 slices shown]
[im 1/5]
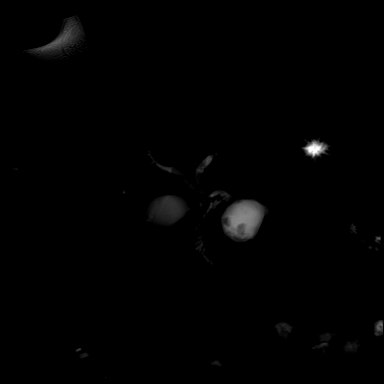

[Series 13: ax out of · axial · 3.0mm · 1.25mm/px · z∈[-232,+29]mm · 7 of 88 slices shown]
[im 1/88]
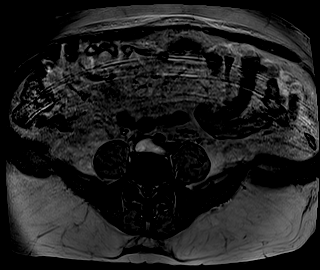
[im 15/88]
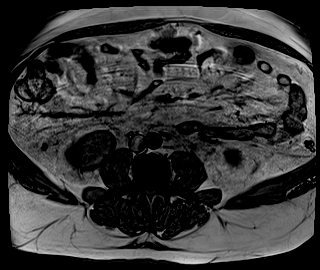
[im 30/88]
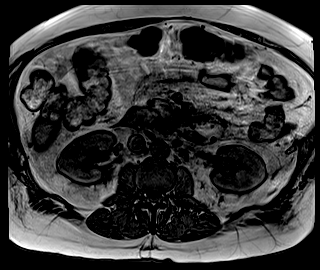
[im 44/88]
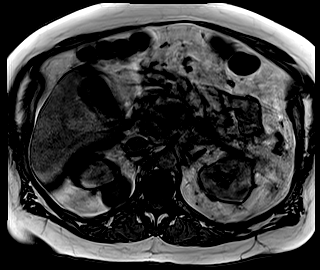
[im 59/88]
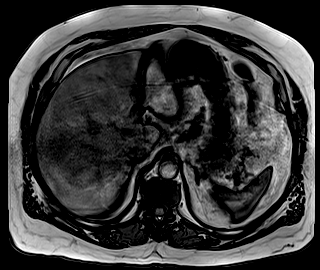
[im 73/88]
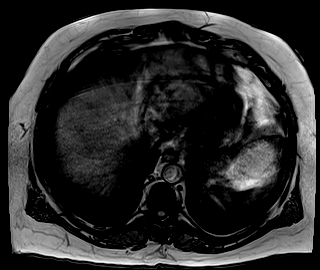
[im 88/88]
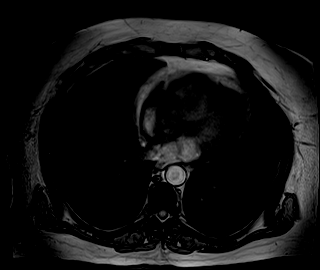

[Series 13: ax in and · axial · 3.0mm · 1.25mm/px · z∈[-232,+29]mm · 7 of 88 slices shown]
[im 1/88]
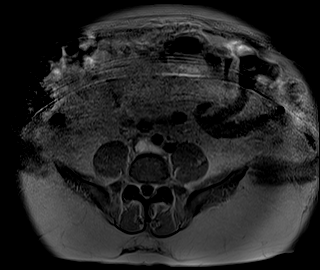
[im 15/88]
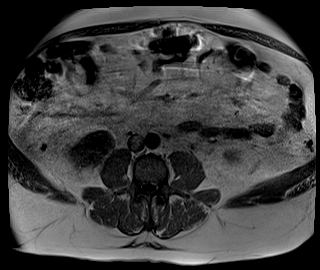
[im 30/88]
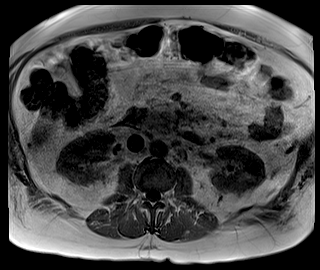
[im 44/88]
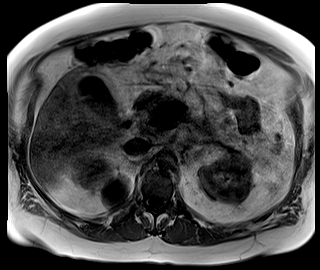
[im 59/88]
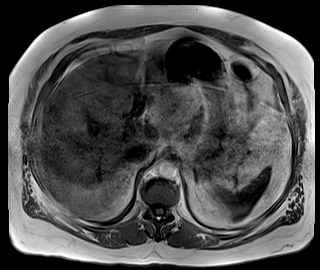
[im 73/88]
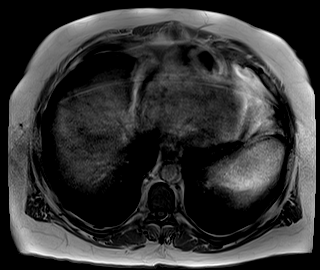
[im 88/88]
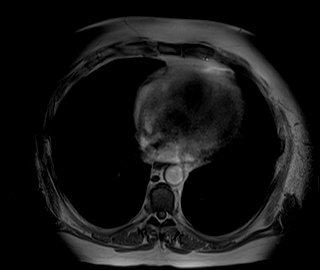

[Series 14: T1 dynamic · axial · non-contrast · 3.0mm · 1.19mm/px · z∈[-232,+29]mm · 7 of 88 slices shown]
[im 1/88]
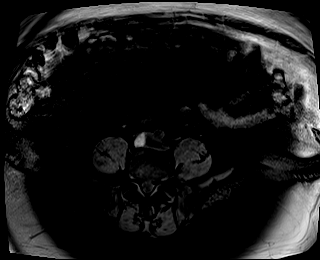
[im 15/88]
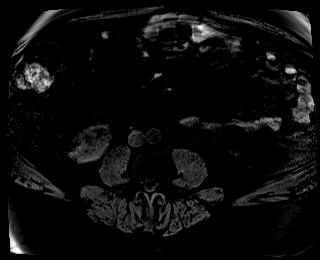
[im 30/88]
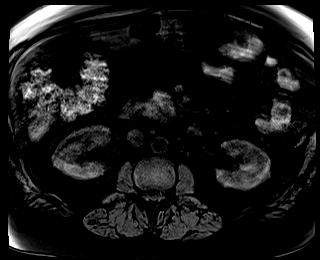
[im 44/88]
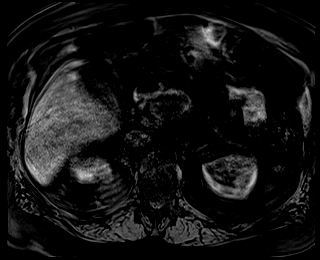
[im 59/88]
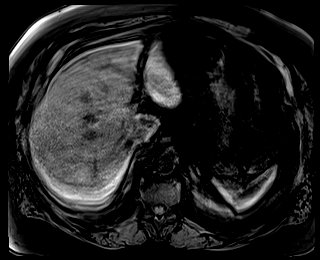
[im 73/88]
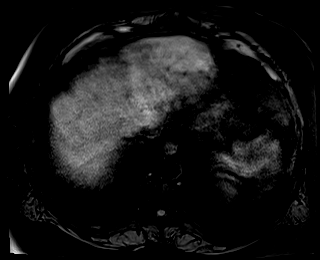
[im 88/88]
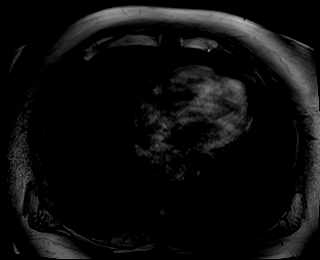

[42 of 48 positions shown; findings below may reference images not displayed]

FINDINGS: Lower chest: No acute findings.

Hepatobiliary: No masses visualized on this unenhanced exam.
Gallbladder is unremarkable. Mild diffuse biliary ductal dilatation
is again seen, with common bile duct measuring approximately 8 mm.
No evidence of choledocholithiasis. Smoothly tapered stricture of
the distal common bile duct is seen adjacent to cystic lesion in the
pancreatic head.

Pancreas: Mild acute pancreatitis is stable in appearance since
previous study. A well-circumscribed cystic lesion is again seen in
the pancreatic head, measuring 4.5 x 4.1 cm, also stable. This is
most consistent with a pseudocyst. Beaded appearance of the main
pancreatic duct is noted, which is suspicious for chronic
pancreatitis.

Spleen:  Within normal limits in size.

Adrenals/Urinary tract: Stable approximately 5 cm benign-appearing
cyst in upper pole of right kidney. No evidence of hydronephrosis.

Stomach/Bowel: Visualized portion unremarkable.

Vascular/Lymphatic: No pathologically enlarged lymph nodes
identified. No evidence of abdominal aortic aneurysm.

Other:  None.

Musculoskeletal:  No suspicious bone lesions identified.
IMPRESSION: 1. Stable mild acute pancreatitis, superimposed on changes of
chronic pancreatitis.
2. Stable 4.5 cm cystic lesion in pancreatic head, most consistent
with pseudocyst.
3. Stable mild biliary ductal dilatation, with smoothly tapered
stricture in the distal common bile duct adjacent to cystic
pancreatic lesion. No evidence of choledocholithiasis.

## 2018-11-30 MED ORDER — SODIUM CHLORIDE 0.9 % IV SOLN
INTRAVENOUS | Status: DC
  Administered 2018-11-30 – 2018-12-04 (×10): via INTRAVENOUS

## 2018-11-30 MED ORDER — OXYCODONE HCL 5 MG PO TABS
5.0000 mg | ORAL_TABLET | Freq: Four times a day (QID) | ORAL | Status: DC | PRN
  Administered 2018-12-03: 21:00:00 5 mg via ORAL
  Filled 2018-11-30 (×2): qty 1

## 2018-11-30 MED ORDER — ADULT MULTIVITAMIN W/MINERALS CH
1.0000 | ORAL_TABLET | Freq: Every day | ORAL | Status: DC
  Administered 2018-12-01 – 2018-12-05 (×4): 1 via ORAL
  Filled 2018-11-30 (×5): qty 1

## 2018-11-30 MED ORDER — CALCIUM CARBONATE ANTACID 500 MG PO CHEW
1.0000 | CHEWABLE_TABLET | Freq: Every day | ORAL | Status: DC
  Administered 2018-12-01 – 2018-12-05 (×4): 200 mg via ORAL
  Filled 2018-11-30 (×5): qty 1

## 2018-11-30 MED ORDER — SENNOSIDES-DOCUSATE SODIUM 8.6-50 MG PO TABS: 1.0000 | ORAL_TABLET | Freq: Every evening | ORAL | Status: DC | PRN

## 2018-11-30 MED ORDER — AMLODIPINE BESYLATE 5 MG PO TABS
5.0000 mg | ORAL_TABLET | Freq: Every day | ORAL | Status: DC
  Administered 2018-12-01 – 2018-12-04 (×4): 5 mg via ORAL
  Filled 2018-11-30 (×4): qty 1

## 2018-11-30 MED ORDER — OMEGA-3-ACID ETHYL ESTERS 1 G PO CAPS
1.0000 g | ORAL_CAPSULE | Freq: Every day | ORAL | Status: DC
  Administered 2018-12-01 – 2018-12-05 (×4): 1 g via ORAL
  Filled 2018-11-30 (×6): qty 1

## 2018-11-30 MED ORDER — IBUPROFEN 200 MG PO TABS
200.0000 mg | ORAL_TABLET | Freq: Four times a day (QID) | ORAL | Status: DC | PRN
  Administered 2018-12-01 – 2018-12-03 (×2): 200 mg via ORAL
  Filled 2018-11-30 (×2): qty 1

## 2018-11-30 MED ORDER — ASPIRIN EC 81 MG PO TBEC: 81.0000 mg | DELAYED_RELEASE_TABLET | Freq: Every day | ORAL | Status: DC

## 2018-11-30 MED ORDER — NICOTINE 21 MG/24HR TD PT24
21.0000 mg | MEDICATED_PATCH | Freq: Every day | TRANSDERMAL | Status: DC
  Administered 2018-12-02: 21 mg via TRANSDERMAL
  Filled 2018-11-30 (×5): qty 1

## 2018-11-30 MED ORDER — VITAMIN C 500 MG PO TABS
500.0000 mg | ORAL_TABLET | Freq: Every day | ORAL | Status: DC
  Administered 2018-12-01 – 2018-12-05 (×4): 500 mg via ORAL
  Filled 2018-11-30 (×5): qty 1

## 2018-11-30 NOTE — Progress Notes (Signed)
PROGRESS NOTE    Philip Rhodes  VQM:[id number] DOB: 05-05-1960 DOA: 11/29/2018 PCP: Elias Else, MD    Brief Narrative:  58 year old gentleman with prior history of hypertension, hyperlipidemia, obstructive sleep apnea on CPAP, tobacco abuse, alcohol abuse recently admitted for acute pancreatitis and discharged on 11/11/2018.  He was recently seen by his PCP and was found to have a bilirubin of 10 and abnormal liver enzymes he was therefore referred to ED for further evaluation.  Patient also reports that he continues to have some mild abdominal discomfort and nausea intermittently.  On arrival to ED he underwent a CT abdomen and pelvis which showed mild diffuse biliary ductal dilatation and a stable 4.5 cystic lesion in the pancreatic head most likely representing a pseudocyst.  [facility] gastroenterology consulted and will see the patient today. Patient seen and examined today he reports abdominal discomfort is minimal, no nausea or vomiting today. He would like the ice chips.   Assessment & Plan:   Principal Problem:   Elevated LFTs Active Problems:   Essential hypertension   Pancreatitis   Hyponatremia   SIRS (systemic inflammatory response syndrome) (HCC)   Hyperbilirubinemia   Tobacco abuse   History of alcohol abuse   Elevated liver enzymes and hyperbilirubinemia Differential include choledocholithiasis.  MRCP ordered and GI consulted.   Patient will remain n.p.o. and continue with empiric antibiotics with IV Zosyn. Follow blood cultures and symptomatic management with IV fluids, IV antiemetics and pain control.  Follow bilirubin levels and liver enzymes and lipase levels. Today total bilirubin level increased to 11.5 and direct bilirubin is 7.3. Procalcitonin level is negative.   Essential hypertension Well-controlled blood pressure parameters.   History of alcohol abuse Patient reports he is abstinent from alcohol for  the last 4 weeks.   Tobacco abuse Counseled and  encouraged to quit.    Hyponatremia probably from slight dehydration. Gently hydrated overnight and his sodium is within normal limits.   Pancreatitis Lipase level still elevated but his abdominal discomfort appears to be minimal.  Continue to monitor.    Mild leukocytosis suspect reactive from pancreatitis.  DVT prophylaxis: Lovenox Code Status: Full code Family Communication: None at bedside Disposition Plan: Pending clinical improvement and further evaluation  Consultants:  Gastroenterology Dr. Randa Evens Procedures: MRCP Antimicrobials: IV Zosyn since admission  Subjective: No nausea, abdominal discomfort is improving.  Objective: Vitals:   11/30/18 0930 11/30/18 0945 11/30/18 1000 11/30/18 1030  BP: (!) 151/82   139/88  Pulse: 66 73 67 77  Resp:      Temp:      TempSrc:      SpO2: 99% 98% 99% 98%  Weight:      Height:       No intake or output data in the 24 hours ending 11/30/18 1104 Filed Weights   11/29/18 2051  Weight: 127 kg    Examination:  General exam: Appears calm and comfortable, icteric Respiratory system: Clear to auscultation. Respiratory effort normal. Cardiovascular system: S1 & S2 heard, RRR. No JVD, . No pedal edema. Gastrointestinal system: Abdomen issoft non tender BS+ Central nervous system: Alert and oriented. No focal neurological deficits. Extremities: Symmetric 5 x 5 power. Skin: No rashes, lesions or ulcers Psychiatry: Mood & affect appropriate.     Data Reviewed: I have personally reviewed following labs and imaging studies  CBC: Recent Labs  Lab 11/29/18 1514 11/30/18 0329  WBC 14.2* 12.8*  NEUTROABS 9.9*  --   HGB 15.8 14.8  HCT 44.0 42.7  MCV  92.1 94.3  PLT 406* 161*   Basic Metabolic Panel: Recent Labs  Lab 11/29/18 1514 11/30/18 0329  NA 130* 135  K 3.7 4.0  CL 97* 103  CO2 21* 20*  GLUCOSE 141* 130*  BUN <5* 5*  CREATININE 0.51* 0.56*  CALCIUM 9.0 8.6*   GFR: Estimated Creatinine Clearance: 132.3  mL/min (A) (by C-G formula based on SCr of 0.56 mg/dL (L)). Liver Function Tests: Recent Labs  Lab 11/29/18 1514 11/30/18 0329  AST 138* 112*  ALT 251* 213*  ALKPHOS 256* 218*  BILITOT 10.7* 11.5*  PROT 7.7 6.9  ALBUMIN 3.4* 3.0*   Recent Labs  Lab 11/29/18 1514  LIPASE 288*   No results for input(s): AMMONIA in the last 168 hours. Coagulation Profile: Recent Labs  Lab 11/29/18 1831 11/30/18 0329  INR 1.1 1.1   Cardiac Enzymes: No results for input(s): CKTOTAL, CKMB, CKMBINDEX, TROPONINI in the last 168 hours. BNP (last 3 results) No results for input(s): PROBNP in the last 8760 hours. HbA1C: No results for input(s): HGBA1C in the last 72 hours. CBG: Recent Labs  Lab 11/30/18 0829  GLUCAP 118*   Lipid Profile: No results for input(s): CHOL, HDL, LDLCALC, TRIG, CHOLHDL, LDLDIRECT in the last 72 hours. Thyroid Function Tests: No results for input(s): TSH, T4TOTAL, FREET4, T3FREE, THYROIDAB in the last 72 hours. Anemia Panel: No results for input(s): VITAMINB12, FOLATE, FERRITIN, TIBC, IRON, RETICCTPCT in the last 72 hours. Sepsis Labs: Recent Labs  Lab 11/30/18 0329  PROCALCITON <0.10    Recent Results (from the past 240 hour(s))  SARS Coronavirus 2 [facility] order, Performed in [facility] hospital lab) Nasopharyngeal Nasopharyngeal Swab     Status: None   Collection Time: 11/29/18  5:27 PM   Specimen: Nasopharyngeal Swab  Result Value Ref Range Status   SARS Coronavirus 2 NEGATIVE NEGATIVE Final    Comment: (NOTE) If result is NEGATIVE SARS-CoV-2 target nucleic acids are NOT DETECTED. The SARS-CoV-2 RNA is generally detectable in upper and lower  respiratory specimens during the acute phase of infection. The lowest  concentration of SARS-CoV-2 viral copies this assay can detect is 250  copies / mL. A negative result does not preclude SARS-CoV-2 infection  and should not be used as the sole basis for treatment or other  patient management decisions.  A  negative result may occur with  improper specimen collection / handling, submission of specimen other  than nasopharyngeal swab, presence of viral mutation(s) within the  areas targeted by this assay, and inadequate number of viral copies  (<250 copies / mL). A negative result must be combined with clinical  observations, patient history, and epidemiological information. If result is POSITIVE SARS-CoV-2 target nucleic acids are DETECTED. The SARS-CoV-2 RNA is generally detectable in upper and lower  respiratory specimens dur ing the acute phase of infection.  Positive  results are indicative of active infection with SARS-CoV-2.  Clinical  correlation with patient history and other diagnostic information is  necessary to determine patient infection status.  Positive results do  not rule out bacterial infection or co-infection with other viruses. If result is PRESUMPTIVE POSTIVE SARS-CoV-2 nucleic acids MAY BE PRESENT.   A presumptive positive result was obtained on the submitted specimen  and confirmed on repeat testing.  While 2019 novel coronavirus  (SARS-CoV-2) nucleic acids may be present in the submitted sample  additional confirmatory testing may be necessary for epidemiological  and / or clinical management purposes  to differentiate between  SARS-CoV-2 and other  Sarbecovirus currently known to infect humans.  If clinically indicated additional testing with an alternate test  methodology [phone] is advised. The SARS-CoV-2 RNA is generally  detectable in upper and lower respiratory sp ecimens during the acute  phase of infection. The expected result is Negative. Fact Sheet for Patients:  BoilerBrush.com.cyhttps://www.fda.gov/media/136312/download Fact Sheet for Healthcare Providers: https://pope.com/https://www.fda.gov/media/136313/download This test is not yet approved or cleared by the [place] FDA and has been authorized for detection and/or diagnosis of SARS-CoV-2 by FDA under an Emergency Use  Authorization (EUA).  This EUA will remain in effect (meaning this test can be used) for the duration of the COVID-19 declaration under Section 564(b)(1) of the Act, 21 U.S.C. section 360bbb-3(b)(1), unless the authorization is terminated or revoked sooner. Performed at [facility], [address] [place], [place] [address]   Culture, blood (Routine X 2) w Reflex to ID Panel     Status: None (Preliminary result)   Collection Time: 11/30/18  3:33 AM   Specimen: BLOOD LEFT HAND  Result Value Ref Range Status   Specimen Description BLOOD LEFT HAND  Final   Special Requests   Final    BOTTLES DRAWN AEROBIC AND ANAEROBIC Blood Culture adequate volume Performed at [facility], [address] [place], [place] [address]    Culture PENDING  Incomplete   Report Status PENDING  Incomplete         Radiology Studies: Ct Abdomen Pelvis W Contrast  Result Date: 11/29/2018 CLINICAL DATA:  Acute abdominal pain and nausea and vomiting. Jaundice. Elevated liver enzymes. Pancreatitis. EXAM: CT ABDOMEN AND PELVIS WITH CONTRAST TECHNIQUE: Multidetector CT imaging of the abdomen and pelvis was performed using the standard protocol following bolus administration of intravenous contrast. CONTRAST:  100mL OMNIPAQUE IOHEXOL 300 MG/ML  SOLN COMPARISON:  11/11/2018 FINDINGS: Lower Chest: No acute findings. Hepatobiliary: No hepatic masses identified. Mild diffuse biliary ductal dilatation is increased since prior study. Gallbladder is unremarkable. Pancreas: Mild acute pancreatitis shows no significant change compared to prior study. A cystic lesion is seen in the pancreatic head measuring 4.5 x 3.8 cm, which is stable in size and most likely represents a pseudocyst. Spleen: Within normal limits in size and appearance. Adrenals/Urinary Tract: No masses identified. Right upper pole renal cyst is stable. No evidence of ureteral calculi or hydronephrosis. Unremarkable unopacified urinary bladder.  Stomach/Bowel: No evidence of obstruction, inflammatory process or abnormal fluid collections. Normal appendix visualized. Vascular/Lymphatic: Shotty sub-cm bilateral iliac lymph nodes show no significant change. No pathologically enlarged lymph nodes. No abdominal aortic aneurysm. Aortic atherosclerosis. Reproductive:  No mass or other significant abnormality. Other:  None. Musculoskeletal:  No suspicious bone lesions identified. IMPRESSION: 1. New mild diffuse biliary ductal dilatation. 2. No significant change in mild acute pancreatitis. 3. Stable 4.5 cm cystic lesion in pancreatic head, most likely representing a pseudocyst. Electronically Signed   By: Danae OrleansJohn A Stahl M.D.   On: 11/29/2018 19:43        Scheduled Meds:  amLODipine  5 mg Oral Daily   aspirin EC  81 mg Oral QHS   calcium carbonate  1 tablet Oral Daily   multivitamin with minerals  1 tablet Oral Daily   nicotine  21 mg Transdermal Daily   omega-3 acid ethyl esters  1 g Oral Daily   vitamin C  500 mg Oral Daily   Continuous Infusions:  sodium chloride 75 mL/hr at 11/30/18 0611   piperacillin-tazobactam (ZOSYN)  IV 3.375 g (11/30/18 [id number])  LOS: 1 day    Time spent: 7036  MIN    Kathlen ModyVijaya Virgel Haro, MD [facility] Pager [phone]   If 7PM-7AM, please contact night-coverage [email or web address] Password [email or web address] 11/30/2018, 11:04 AM

## 2018-11-30 NOTE — Progress Notes (Signed)
Pt set up on CPAP tolerating well.  

## 2018-11-30 NOTE — H&P (View-Only) (Signed)
[facility] CONSULT Reason for consult: Pancreatitis with worsening liver test Referring Physician: [facility] hospitalist.  PCP: Dr.Reade.  Primary GI: Dr. Brombhatt  Philip Rhodes is an 57 y.o. male.  HPI: Patient was hospitalized from 8/27 to 8/31 with his first episode of acute pancreatitis secondary to alcohol.  He has had no alcohol now for 3-1/2 weeks.  He had a pseudocyst in the head of the pancreas at that time and has been at home after his discharge feeling fairly well, no alcohol, low-fat diet.  3 days ago he had epigastric pain and some nausea similar to his episode of pancreatitis.  This lasted intermittently for several days.  He presented to his PCP and labs showed elevated bilirubin and elevated LFTs.  He still having some discomfort but it was improving.  He was sent to the emergency room and his labs indicated WBC 14 increased ALT and AST and total bilirubin 10.7 lipase 288.  CT scan was obtained showed some mild biliary dilatation and a stable 4-1/2 cm cyst in the pancreatic head.  I have reviewed the CT and it appears that the cystic structure is compressing the CBD.  No gross stones were seen.  The patient has not had anything to eat in 24 hours and states that he feels somewhat better.  He states that his urine has been dark now for several days.  He reinforces the idea that he has had no alcohol now for nearly a month.  His lipase on admission was 288 and alkaline phosphatase was elevated at 256.  Past Medical History:  Diagnosis Date  . Hyperlipemia   . Hypertension   . Sleep apnea     History reviewed. No pertinent surgical history.  Family History  Problem Relation Age of Onset  . Hypertension Mother     Social History:  reports that he has been smoking. He has been smoking about 1.00 pack per day. He has never used smokeless tobacco. He reports current alcohol use. He reports previous drug use.  Allergies: No Known Allergies  Medications; Prior to  Admission medications   Medication Sig Start Date End Date Taking? Authorizing Provider  amLODipine (NORVASC) 5 MG tablet Take 5 mg by mouth daily. 11/11/18  Yes [provider]  Ascorbic Acid (VITAMIN C PO) Take 1 tablet by mouth daily.   Yes [provider]  aspirin EC 81 MG tablet Take 81 mg by mouth at bedtime.   Yes [provider]  calcium carbonate (TUMS - DOSED IN MG ELEMENTAL CALCIUM) 500 MG chewable tablet Chew 1 tablet by mouth daily.   Yes [provider]  HYDROcodone-acetaminophen (NORCO/VICODIN) 5-325 MG tablet Take 1 tablet by mouth every 6 (six) hours as needed for pain. 11/11/18  Yes [provider]  ibuprofen (ADVIL) 200 MG tablet Take 200 mg by mouth every 6 (six) hours as needed for headache or mild pain.   Yes [provider]  lisinopril (ZESTRIL) 20 MG tablet Take 20 mg by mouth every morning. 11/16/18  Yes [provider]  Multiple Vitamins-Minerals (MULTIVITAMIN ADULT PO) Take 1 tablet by mouth daily.   Yes [provider]  Omega-3 Fatty Acids (FISH OIL PO) Take 1 tablet by mouth daily.   Yes [provider]  simvastatin (ZOCOR) 40 MG tablet Take 40 mg by mouth every evening. 09/10/18  Yes [provider]   . amLODipine  5 mg Oral Daily  . aspirin EC  81 mg Oral QHS  . calcium carbonate    1 tablet Oral Daily  . multivitamin with minerals  1 tablet Oral Daily  . nicotine  21 mg Transdermal Daily  . omega-3 acid ethyl esters  1 g Oral Daily  . vitamin C  500 mg Oral Daily   PRN Meds hydrALAZINE, ibuprofen, ondansetron (ZOFRAN) IV, oxyCODONE, senna-docusate Results for orders placed or performed during the hospital encounter of 11/29/18 (from the past 48 hour(s))  Comprehensive metabolic panel     Status: Abnormal   Collection Time: 11/29/18  3:14 PM  Result Value Ref Range   Sodium 130 (L) 135 - 145 mmol/L   Potassium 3.7 3.5 - 5.1 mmol/L   Chloride 97 (L) 98 - 111 mmol/L   CO2 21  (L) 22 - 32 mmol/L   Glucose, Bld 141 (H) 70 - 99 mg/dL   BUN <5 (L) 6 - 20 mg/dL   Creatinine, Ser 0.51 (L) 0.61 - 1.24 mg/dL   Calcium 9.0 8.9 - 10.3 mg/dL   Total Protein 7.7 6.5 - 8.1 g/dL   Albumin 3.4 (L) 3.5 - 5.0 g/dL   AST 138 (H) 15 - 41 U/L   ALT 251 (H) 0 - 44 U/L   Alkaline Phosphatase 256 (H) 38 - 126 U/L   Total Bilirubin 10.7 (H) 0.3 - 1.2 mg/dL   GFR calc non Af Amer >60 >60 mL/min   GFR calc Af Amer >60 >60 mL/min   Anion gap 12 5 - 15    Comment: Performed at [facility], [address] [place], [place] [address]  Lipase, blood     Status: Abnormal   Collection Time: 11/29/18  3:14 PM  Result Value Ref Range   Lipase 288 (H) 11 - 51 U/L    Comment: Performed at [facility], [address] [place], [place] [address]  CBC with Diff     Status: Abnormal   Collection Time: 11/29/18  3:14 PM  Result Value Ref Range   WBC 14.2 (H) 4.0 - 10.5 K/uL   RBC 4.78 4.22 - 5.81 MIL/uL   Hemoglobin 15.8 13.0 - 17.0 g/dL   HCT 44.0 39.0 - 52.0 %   MCV 92.1 80.0 - 100.0 fL   MCH 33.1 26.0 - 34.0 pg   MCHC 35.9 30.0 - 36.0 g/dL   RDW 13.1 11.5 - 15.5 %   Platelets 406 (H) 150 - 400 K/uL   nRBC 0.0 0.0 - 0.2 %   Neutrophils Relative % 70 %   Neutro Abs 9.9 (H) 1.7 - 7.7 K/uL   Lymphocytes Relative 18 %   Lymphs Abs 2.5 0.7 - 4.0 K/uL   Monocytes Relative 9 %   Monocytes Absolute 1.3 (H) 0.1 - 1.0 K/uL   Eosinophils Relative 2 %   Eosinophils Absolute 0.3 0.0 - 0.5 K/uL   Basophils Relative 1 %   Basophils Absolute 0.1 0.0 - 0.1 K/uL   Immature Granulocytes 0 %   Abs Immature Granulocytes 0.06 0.00 - 0.07 K/uL    Comment: Performed at [facility], [address] [place], [place] [address]  Urinalysis, Routine w reflex microscopic     Status: Abnormal   Collection Time: 11/29/18  4:59 PM  Result Value Ref Range   Color, Urine AMBER (A) YELLOW    Comment: BIOCHEMICALS MAY BE AFFECTED BY COLOR   APPearance CLEAR CLEAR   Specific Gravity, Urine  1.015 1.005 - 1.030   pH 5.0 5.0 - 8.0   Glucose, UA NEGATIVE NEGATIVE   mg/dL   Hgb urine dipstick SMALL (A) NEGATIVE   Bilirubin Urine MODERATE (A) NEGATIVE   Ketones, ur 20 (A) NEGATIVE mg/dL   Protein, ur NEGATIVE NEGATIVE mg/dL   Nitrite NEGATIVE NEGATIVE   Leukocytes,Ua NEGATIVE NEGATIVE   RBC / HPF 0-5 0 - 5 RBC/hpf   WBC, UA 6-10 0 - 5 WBC/hpf   Bacteria, UA RARE (A) NONE SEEN   Mucus PRESENT     Comment: Performed at [facility], [address] [place], [place] [address]  SARS Coronavirus 2 (Hospital order, Performed in [facility] lab) Nasopharyngeal Nasopharyngeal Swab     Status: None   Collection Time: 11/29/18  5:27 PM   Specimen: Nasopharyngeal Swab  Result Value Ref Range   SARS Coronavirus 2 NEGATIVE NEGATIVE    Comment: (NOTE) If result is NEGATIVE SARS-CoV-2 target nucleic acids are NOT DETECTED. The SARS-CoV-2 RNA is generally detectable in upper and lower  respiratory specimens during the acute phase of infection. The lowest  concentration of SARS-CoV-2 viral copies this assay can detect is 250  copies / mL. A negative result does not preclude SARS-CoV-2 infection  and should not be used as the sole basis for treatment or other  patient management decisions.  A negative result may occur with  improper specimen collection / handling, submission of specimen other  than nasopharyngeal swab, presence of viral mutation(s) within the  areas targeted by this assay, and inadequate number of viral copies  (<250 copies / mL). A negative result must be combined with clinical  observations, patient history, and epidemiological information. If result is POSITIVE SARS-CoV-2 target nucleic acids are DETECTED. The SARS-CoV-2 RNA is generally detectable in upper and lower  respiratory specimens dur ing the acute phase of infection.  Positive  results are indicative of active infection with SARS-CoV-2.  Clinical  correlation with patient history and other  diagnostic information is  necessary to determine patient infection status.  Positive results do  not rule out bacterial infection or co-infection with other viruses. If result is PRESUMPTIVE POSTIVE SARS-CoV-2 nucleic acids MAY BE PRESENT.   A presumptive positive result was obtained on the submitted specimen  and confirmed on repeat testing.  While 2019 novel coronavirus  (SARS-CoV-2) nucleic acids may be present in the submitted sample  additional confirmatory testing may be necessary for epidemiological  and / or clinical management purposes  to differentiate between  SARS-CoV-2 and other Sarbecovirus currently known to infect humans.  If clinically indicated additional testing with an alternate test  methodology ([id number]) is advised. The SARS-CoV-2 RNA is generally  detectable in upper and lower respiratory sp ecimens during the acute  phase of infection. The expected result is Negative. Fact Sheet for Patients:  https://www.fda.gov/media/136312/download Fact Sheet for Healthcare Providers: https://www.fda.gov/media/136313/download This test is not yet approved or cleared by the [place] FDA and has been authorized for detection and/or diagnosis of SARS-CoV-2 by FDA under an Emergency Use Authorization (EUA).  This EUA will remain in effect (meaning this test can be used) for the duration of the COVID-19 declaration under Section 564(b)(1) of the Act, 21 U.S.C. section 360bbb-3(b)(1), unless the authorization is terminated or revoked sooner. Performed at [facility], [address] [place], [place] [address]   Protime-INR     Status: None   Collection Time: 11/29/18  6:31 PM  Result Value Ref Range   Prothrombin Time 14.2 11.4 - 15.2 seconds   INR 1.1 0.8 - 1.2      Comment: (NOTE) INR goal varies based on device and disease states. Performed at [facility], [address] [place], [place] [address]   APTT     Status: None   Collection Time: 11/29/18   6:31 PM  Result Value Ref Range   aPTT 34 24 - 36 seconds    Comment: Performed at [facility], [address] [place], [place] [address]  Bilirubin, direct     Status: Abnormal   Collection Time: 11/30/18  3:29 AM  Result Value Ref Range   Bilirubin, Direct 7.3 (H) 0.0 - 0.2 mg/dL    Comment: Performed at  Hospital Lab, [address] , [place] [address]  Procalcitonin     Status: None   Collection Time: 11/30/18  3:29 AM  Result Value Ref Range   Procalcitonin <0.10 ng/mL    Comment:        Interpretation: PCT (Procalcitonin) <= 0.5 ng/mL: Systemic infection (sepsis) is not likely. Local bacterial infection is possible. (NOTE)       Sepsis PCT Algorithm           Lower Respiratory Tract                                      Infection PCT Algorithm    ----------------------------     ----------------------------         PCT < 0.25 ng/mL                PCT < 0.10 ng/mL         Strongly encourage             Strongly discourage   discontinuation of antibiotics    initiation of antibiotics    ----------------------------     -----------------------------       PCT 0.25 - 0.50 ng/mL            PCT 0.10 - 0.25 ng/mL               OR       >80% decrease in PCT            Discourage initiation of                                            antibiotics      Encourage discontinuation           of antibiotics    ----------------------------     -----------------------------         PCT >= 0.50 ng/mL              PCT 0.26 - 0.50 ng/mL               AND        <80% decrease in PCT             Encourage initiation of                                             antibiotics       Encourage continuation           of antibiotics    ----------------------------     -----------------------------          PCT >= 0.50 ng/mL                  PCT > 0.50 ng/mL               AND         increase in PCT                  Strongly encourage                                       initiation of antibiotics    Strongly encourage escalation           of antibiotics                                     -----------------------------                                           PCT <= 0.25 ng/mL                                                 OR                                        > 80% decrease in PCT                                     Discontinue / Do not initiate                                             antibiotics Performed at [facility], [address] [place], [place] [address]   Comprehensive metabolic panel     Status: Abnormal   Collection Time: 11/30/18  3:29 AM  Result Value Ref Range   Sodium 135 135 - 145 mmol/L   Potassium 4.0 3.5 - 5.1 mmol/L   Chloride 103 98 - 111 mmol/L   CO2 20 (L) 22 - 32 mmol/L   Glucose, Bld 130 (H) 70 - 99 mg/dL   BUN 5 (L) 6 - 20 mg/dL   Creatinine, Ser 0.56 (L) 0.61 - 1.24 mg/dL   Calcium 8.6 (L) 8.9 - 10.3 mg/dL   Total Protein 6.9 6.5 - 8.1 g/dL   Albumin 3.0 (L) 3.5 - 5.0 g/dL   AST 112 (H) 15 - 41 U/L   ALT 213 (H) 0 - 44 U/L   Alkaline Phosphatase 218 (H) 38 - 126 U/L   Total Bilirubin 11.5 (H) 0.3 - 1.2 mg/dL   GFR calc non Af Amer >60 >60 mL/min   GFR calc Af Amer >60 >60 mL/min   Anion gap 12 5 - 15    Comment: Performed at [facility], [address] [place], [place] [address]    CBC     Status: Abnormal   Collection Time: 11/30/18  3:29 AM  Result Value Ref Range   WBC 12.8 (H) 4.0 - 10.5 K/uL   RBC 4.53 4.22 - 5.81 MIL/uL   Hemoglobin 14.8 13.0 - 17.0 g/dL   HCT 42.7 39.0 - 52.0 %   MCV 94.3 80.0 - 100.0 fL   MCH 32.7 26.0 - 34.0 pg   MCHC 34.7 30.0 - 36.0 g/dL   RDW 13.5 11.5 - 15.5 %   Platelets 401 (H) 150 - 400 K/uL   nRBC 0.0 0.0 - 0.2 %    Comment: Performed at [facility], [address] [place], [place] [address]  Protime-INR     Status: None   Collection Time: 11/30/18  3:29 AM  Result Value Ref Range   Prothrombin Time 14.3 11.4 - 15.2 seconds   INR 1.1 0.8 -  1.2    Comment: (NOTE) INR goal varies based on device and disease states. Performed at [facility], [address] [place], [place] [address]   Culture, blood (Routine X 2) w Reflex to ID Panel     Status: None (Preliminary result)   Collection Time: 11/30/18  3:33 AM   Specimen: BLOOD LEFT HAND  Result Value Ref Range   Specimen Description BLOOD LEFT HAND    Special Requests      BOTTLES DRAWN AEROBIC AND ANAEROBIC Blood Culture adequate volume Performed at [facility], [address] [place], [place] [address]    Culture PENDING    Report Status PENDING   CBG monitoring, ED     Status: Abnormal   Collection Time: 11/30/18  8:29 AM  Result Value Ref Range   Glucose-Capillary 118 (H) 70 - 99 mg/dL    Ct Abdomen Pelvis W Contrast  Result Date: 11/29/2018 CLINICAL DATA:  Acute abdominal pain and nausea and vomiting. Jaundice. Elevated liver enzymes. Pancreatitis. EXAM: CT ABDOMEN AND PELVIS WITH CONTRAST TECHNIQUE: Multidetector CT imaging of the abdomen and pelvis was performed using the standard protocol following bolus administration of intravenous contrast. CONTRAST:  100mL OMNIPAQUE IOHEXOL 300 MG/ML  SOLN COMPARISON:  11/11/2018 FINDINGS: Lower Chest: No acute findings. Hepatobiliary: No hepatic masses identified. Mild diffuse biliary ductal dilatation is increased since prior study. Gallbladder is unremarkable. Pancreas: Mild acute pancreatitis shows no significant change compared to prior study. A cystic lesion is seen in the pancreatic head measuring 4.5 x 3.8 cm, which is stable in size and most likely represents a pseudocyst. Spleen: Within normal limits in size and appearance. Adrenals/Urinary Tract: No masses identified. Right upper pole renal cyst is stable. No evidence of ureteral calculi or hydronephrosis. Unremarkable unopacified urinary bladder. Stomach/Bowel: No evidence of obstruction, inflammatory process or abnormal fluid collections. Normal appendix  visualized. Vascular/Lymphatic: Shotty sub-cm bilateral iliac lymph nodes show no significant change. No pathologically enlarged lymph nodes. No abdominal aortic aneurysm. Aortic atherosclerosis. Reproductive:  No mass or other significant abnormality. Other:  None. Musculoskeletal:  No suspicious bone lesions identified. IMPRESSION: 1. New mild diffuse biliary ductal dilatation. 2. No significant change in mild acute pancreatitis. 3. Stable 4.5 cm cystic lesion in pancreatic head, most likely representing a pseudocyst. Electronically Signed   By: John A Stahl M.D.   On: 11/29/2018 19:43               Blood pressure (!) 162/77, pulse 76, temperature 98.3 F (36.8 C), temperature source Oral, resp. rate (!) 26, height 5' 8" (1.727   m), weight 127 kg, SpO2 93 %.  Physical exam:   General--obese white male no distress ENT--slightly icteric Neck--supple Heart--normal S1 and S2 with no murmurs or gallops regular rhythm Lungs--clear Abdomen--obese with good bowel sounds no tenderness Psych--alert and oriented answers questions appropriately mental state appropriate   Assessment: 1.  Alcoholic pancreatitis with pseudocyst.  The patient had been doing well for about 10 days after discharge until the past few days.  He began to have abdominal pain and liver tests were elevated.  I have reviewed the CT scan and is quite possible that the 4 cm pseudocyst in the head of his pancreas is partially compressing the bile duct.  His WBC was elevated and agree with going ahead with empiric antibiotics.  Plan: Would go ahead and keep him n.p.o. and give him antibiotics.  I noticed that he has another MRCP ordered is probably reasonable to go ahead with that.  If there is any suggestion that there could be a CBD stone he may need an endoscopic ultrasound.  He is previously had ultrasound, MRCP showing no evidence of bile duct or gallbladder stones. We will follow with you.   Philip Rhodes 11/30/2018,  8:52 AM   This note was created using voice recognition software and minor errors may Have occurred unintentionally. Pager: [phone] If no answer or after hours call [phone]    

## 2018-11-30 NOTE — ED Notes (Signed)
Patient transported to MRI 

## 2018-11-30 NOTE — Consult Note (Signed)
[facility] CONSULT Reason for consult: Pancreatitis with worsening liver test Referring Physician: [facility] hospitalist.  PCP: Dr.Reade.  Primary GI: Dr. Angeline Slim Rone is an 58 y.o. male.  HPI: Patient was hospitalized from 8/27 to 8/31 with his first episode of acute pancreatitis secondary to alcohol.  He has had no alcohol now for 3-1/2 weeks.  He had a pseudocyst in the head of the pancreas at that time and has been at home after his discharge feeling fairly well, no alcohol, low-fat diet.  3 days ago he had epigastric pain and some nausea similar to his episode of pancreatitis.  This lasted intermittently for several days.  He presented to his PCP and labs showed elevated bilirubin and elevated LFTs.  He still having some discomfort but it was improving.  He was sent to the emergency room and his labs indicated WBC 14 increased ALT and AST and total bilirubin 10.7 lipase 288.  CT scan was obtained showed some mild biliary dilatation and a stable 4-1/2 cm cyst in the pancreatic head.  I have reviewed the CT and it appears that the cystic structure is compressing the CBD.  No gross stones were seen.  The patient has not had anything to eat in 24 hours and states that he feels somewhat better.  He states that his urine has been dark now for several days.  He reinforces the idea that he has had no alcohol now for nearly a month.  His lipase on admission was 288 and alkaline phosphatase was elevated at 256.  Past Medical History:  Diagnosis Date  . Hyperlipemia   . Hypertension   . Sleep apnea     History reviewed. No pertinent surgical history.  Family History  Problem Relation Age of Onset  . Hypertension Mother     Social History:  reports that he has been smoking. He has been smoking about 1.00 pack per day. He has never used smokeless tobacco. He reports current alcohol use. He reports previous drug use.  Allergies: No Known Allergies  Medications; Prior to  Admission medications   Medication Sig Start Date End Date Taking? Authorizing Provider  amLODipine (NORVASC) 5 MG tablet Take 5 mg by mouth daily. 11/11/18  Yes [provider]  Ascorbic Acid (VITAMIN C PO) Take 1 tablet by mouth daily.   Yes [provider]  aspirin EC 81 MG tablet Take 81 mg by mouth at bedtime.   Yes [provider]  calcium carbonate (TUMS - DOSED IN MG ELEMENTAL CALCIUM) 500 MG chewable tablet Chew 1 tablet by mouth daily.   Yes [provider]  HYDROcodone-acetaminophen (NORCO/VICODIN) 5-325 MG tablet Take 1 tablet by mouth every 6 (six) hours as needed for pain. 11/11/18  Yes [provider]  ibuprofen (ADVIL) 200 MG tablet Take 200 mg by mouth every 6 (six) hours as needed for headache or mild pain.   Yes [provider]  lisinopril (ZESTRIL) 20 MG tablet Take 20 mg by mouth every morning. 11/16/18  Yes [provider]  Multiple Vitamins-Minerals (MULTIVITAMIN ADULT PO) Take 1 tablet by mouth daily.   Yes [provider]  Omega-3 Fatty Acids (FISH OIL PO) Take 1 tablet by mouth daily.   Yes [provider]  simvastatin (ZOCOR) 40 MG tablet Take 40 mg by mouth every evening. 09/10/18  Yes [provider]   . amLODipine  5 mg Oral Daily  . aspirin EC  81 mg Oral QHS  . calcium carbonate  1 tablet Oral Daily  . multivitamin with minerals  1 tablet Oral Daily  . nicotine  21 mg Transdermal Daily  . omega-3 acid ethyl esters  1 g Oral Daily  . vitamin C  500 mg Oral Daily   PRN Meds hydrALAZINE, ibuprofen, ondansetron (ZOFRAN) IV, oxyCODONE, senna-docusate Results for orders placed or performed during the hospital encounter of 11/29/18 (from the past 48 hour(s))  Comprehensive metabolic panel     Status: Abnormal   Collection Time: 11/29/18  3:14 PM  Result Value Ref Range   Sodium 130 (L) 135 - 145 mmol/L   Potassium 3.7 3.5 - 5.1 mmol/L   Chloride 97 (L) 98 - 111 mmol/L   CO2 21  (L) 22 - 32 mmol/L   Glucose, Bld 141 (H) 70 - 99 mg/dL   BUN <5 (L) 6 - 20 mg/dL   Creatinine, Ser 4.090.51 (L) 0.61 - 1.24 mg/dL   Calcium 9.0 8.9 - 81.110.3 mg/dL   Total Protein 7.7 6.5 - 8.1 g/dL   Albumin 3.4 (L) 3.5 - 5.0 g/dL   AST 914138 (H) 15 - 41 U/L   ALT 251 (H) 0 - 44 U/L   Alkaline Phosphatase 256 (H) 38 - 126 U/L   Total Bilirubin 10.7 (H) 0.3 - 1.2 mg/dL   GFR calc non Af Amer >60 >60 mL/min   GFR calc Af Amer >60 >60 mL/min   Anion gap 12 5 - 15    Comment: Performed at [facility], [address] [place], [place] [address]  Lipase, blood     Status: Abnormal   Collection Time: 11/29/18  3:14 PM  Result Value Ref Range   Lipase 288 (H) 11 - 51 U/L    Comment: Performed at [facility], [address] [place], [place] [address]  CBC with Diff     Status: Abnormal   Collection Time: 11/29/18  3:14 PM  Result Value Ref Range   WBC 14.2 (H) 4.0 - 10.5 K/uL   RBC 4.78 4.22 - 5.81 MIL/uL   Hemoglobin 15.8 13.0 - 17.0 g/dL   HCT 86.544.0 78.439.0 - 69.652.0 %   MCV 92.1 80.0 - 100.0 fL   MCH 33.1 26.0 - 34.0 pg   MCHC 35.9 30.0 - 36.0 g/dL   RDW 29.513.1 28.411.5 - 13.215.5 %   Platelets 406 (H) 150 - 400 K/uL   nRBC 0.0 0.0 - 0.2 %   Neutrophils Relative % 70 %   Neutro Abs 9.9 (H) 1.7 - 7.7 K/uL   Lymphocytes Relative 18 %   Lymphs Abs 2.5 0.7 - 4.0 K/uL   Monocytes Relative 9 %   Monocytes Absolute 1.3 (H) 0.1 - 1.0 K/uL   Eosinophils Relative 2 %   Eosinophils Absolute 0.3 0.0 - 0.5 K/uL   Basophils Relative 1 %   Basophils Absolute 0.1 0.0 - 0.1 K/uL   Immature Granulocytes 0 %   Abs Immature Granulocytes 0.06 0.00 - 0.07 K/uL    Comment: Performed at [facility], [address] [place], [place] [address]  Urinalysis, Routine w reflex microscopic     Status: Abnormal   Collection Time: 11/29/18  4:59 PM  Result Value Ref Range   Color, Urine AMBER (A) YELLOW    Comment: BIOCHEMICALS MAY BE AFFECTED BY COLOR   APPearance CLEAR CLEAR   Specific Gravity, Urine  1.015 1.005 - 1.030   pH 5.0 5.0 - 8.0   Glucose, UA NEGATIVE NEGATIVE  mg/dL   Hgb urine dipstick SMALL (A) NEGATIVE   Bilirubin Urine MODERATE (A) NEGATIVE   Ketones, ur 20 (A) NEGATIVE mg/dL   Protein, ur NEGATIVE NEGATIVE mg/dL   Nitrite NEGATIVE NEGATIVE   Leukocytes,Ua NEGATIVE NEGATIVE   RBC / HPF 0-5 0 - 5 RBC/hpf   WBC, UA 6-10 0 - 5 WBC/hpf   Bacteria, UA RARE (A) NONE SEEN   Mucus PRESENT     Comment: Performed at [facility], [address], [place], [place] [address]  SARS Coronavirus 2 [facility] order, Performed in [facility] hospital lab) Nasopharyngeal Nasopharyngeal Swab     Status: None   Collection Time: 11/29/18  5:27 PM   Specimen: Nasopharyngeal Swab  Result Value Ref Range   SARS Coronavirus 2 NEGATIVE NEGATIVE    Comment: (NOTE) If result is NEGATIVE SARS-CoV-2 target nucleic acids are NOT DETECTED. The SARS-CoV-2 RNA is generally detectable in upper and lower  respiratory specimens during the acute phase of infection. The lowest  concentration of SARS-CoV-2 viral copies this assay can detect is 250  copies / mL. A negative result does not preclude SARS-CoV-2 infection  and should not be used as the sole basis for treatment or other  patient management decisions.  A negative result may occur with  improper specimen collection / handling, submission of specimen other  than nasopharyngeal swab, presence of viral mutation(s) within the  areas targeted by this assay, and inadequate number of viral copies  (<250 copies / mL). A negative result must be combined with clinical  observations, patient history, and epidemiological information. If result is POSITIVE SARS-CoV-2 target nucleic acids are DETECTED. The SARS-CoV-2 RNA is generally detectable in upper and lower  respiratory specimens dur ing the acute phase of infection.  Positive  results are indicative of active infection with SARS-CoV-2.  Clinical  correlation with patient history and other  diagnostic information is  necessary to determine patient infection status.  Positive results do  not rule out bacterial infection or co-infection with other viruses. If result is PRESUMPTIVE POSTIVE SARS-CoV-2 nucleic acids MAY BE PRESENT.   A presumptive positive result was obtained on the submitted specimen  and confirmed on repeat testing.  While 2019 novel coronavirus  (SARS-CoV-2) nucleic acids may be present in the submitted sample  additional confirmatory testing may be necessary for epidemiological  and / or clinical management purposes  to differentiate between  SARS-CoV-2 and other Sarbecovirus currently known to infect humans.  If clinically indicated additional testing with an alternate test  methodology [phone]) is advised. The SARS-CoV-2 RNA is generally  detectable in upper and lower respiratory sp ecimens during the acute  phase of infection. The expected result is Negative. Fact Sheet for Patients:  BoilerBrush.com.cy Fact Sheet for Healthcare Providers: https://pope.com/ This test is not yet approved or cleared by the [place] FDA and has been authorized for detection and/or diagnosis of SARS-CoV-2 by FDA under an Emergency Use Authorization (EUA).  This EUA will remain in effect (meaning this test can be used) for the duration of the COVID-19 declaration under Section 564(b)(1) of the Act, 21 U.S.C. section 360bbb-3(b)(1), unless the authorization is terminated or revoked sooner. Performed at [facility], [address] [place], [place] [address]   Protime-INR     Status: None   Collection Time: 11/29/18  6:31 PM  Result Value Ref Range   Prothrombin Time 14.2 11.4 - 15.2 seconds   INR 1.1 0.8 - 1.2  Comment: (NOTE) INR goal varies based on device and disease states. Performed at [facility], [place] [address] [place], [place] [address]   APTT     Status: None   Collection Time: 11/29/18   6:31 PM  Result Value Ref Range   aPTT 34 24 - 36 seconds    Comment: Performed at [place] [address] [place], [place] [address]  Bilirubin, direct     Status: Abnormal   Collection Time: 11/30/18  3:29 AM  Result Value Ref Range   Bilirubin, Direct 7.3 (H) 0.0 - 0.2 mg/dL    Comment: Performed at [place] [address] [place], [place] [address]  Procalcitonin     Status: None   Collection Time: 11/30/18  3:29 AM  Result Value Ref Range   Procalcitonin <0.10 ng/mL    Comment:        Interpretation: PCT (Procalcitonin) <= 0.5 ng/mL: Systemic infection (sepsis) is not likely. Local bacterial infection is possible. (NOTE)       Sepsis PCT Algorithm           Lower Respiratory Tract                                      Infection PCT Algorithm    ----------------------------     ----------------------------         PCT < 0.25 ng/mL                PCT < 0.10 ng/mL         Strongly encourage             Strongly discourage   discontinuation of antibiotics    initiation of antibiotics    ----------------------------     -----------------------------       PCT 0.25 - 0.50 ng/mL            PCT 0.10 - 0.25 ng/mL               OR       >80% decrease in PCT            Discourage initiation of                                            antibiotics      Encourage discontinuation           of antibiotics    ----------------------------     -----------------------------         PCT >= 0.50 ng/mL              PCT 0.26 - 0.50 ng/mL               AND        <80% decrease in PCT             Encourage initiation of                                             antibiotics       Encourage continuation           of antibiotics    ----------------------------     -----------------------------  PCT >= 0.50 ng/mL                  PCT > 0.50 ng/mL               AND         increase in PCT                  Strongly encourage                                       initiation of antibiotics    Strongly encourage escalation           of antibiotics                                     -----------------------------                                           PCT <= 0.25 ng/mL                                                 OR                                        > 80% decrease in PCT                                     Discontinue / Do not initiate                                             antibiotics Performed at [facility], [address], [place], [place] [address]   Comprehensive metabolic panel     Status: Abnormal   Collection Time: 11/30/18  3:29 AM  Result Value Ref Range   Sodium 135 135 - 145 mmol/L   Potassium 4.0 3.5 - 5.1 mmol/L   Chloride 103 98 - 111 mmol/L   CO2 20 (L) 22 - 32 mmol/L   Glucose, Bld 130 (H) 70 - 99 mg/dL   BUN 5 (L) 6 - 20 mg/dL   Creatinine, Ser 6.04 (L) 0.61 - 1.24 mg/dL   Calcium 8.6 (L) 8.9 - 10.3 mg/dL   Total Protein 6.9 6.5 - 8.1 g/dL   Albumin 3.0 (L) 3.5 - 5.0 g/dL   AST 540 (H) 15 - 41 U/L   ALT 213 (H) 0 - 44 U/L   Alkaline Phosphatase 218 (H) 38 - 126 U/L   Total Bilirubin 11.5 (H) 0.3 - 1.2 mg/dL   GFR calc non Af Amer >60 >60 mL/min   GFR calc Af Amer >60 >60 mL/min   Anion gap 12 5 - 15    Comment: Performed at [facility], [address] [place], [place] [address]  CBC     Status: Abnormal   Collection Time: 11/30/18  3:29 AM  Result Value Ref Range   WBC 12.8 (H) 4.0 - 10.5 K/uL   RBC 4.53 4.22 - 5.81 MIL/uL   Hemoglobin 14.8 13.0 - 17.0 g/dL   HCT 16.142.7 09.639.0 - 04.552.0 %   MCV 94.3 80.0 - 100.0 fL   MCH 32.7 26.0 - 34.0 pg   MCHC 34.7 30.0 - 36.0 g/dL   RDW 40.913.5 81.111.5 - 91.415.5 %   Platelets 401 (H) 150 - 400 K/uL   nRBC 0.0 0.0 - 0.2 %    Comment: Performed at [facility], [address] [place], [place] [address]  Protime-INR     Status: None   Collection Time: 11/30/18  3:29 AM  Result Value Ref Range   Prothrombin Time 14.3 11.4 - 15.2 seconds   INR 1.1 0.8 -  1.2    Comment: (NOTE) INR goal varies based on device and disease states. Performed at [facility], [address] [place], [place] [address]   Culture, blood (Routine X 2) w Reflex to ID Panel     Status: None (Preliminary result)   Collection Time: 11/30/18  3:33 AM   Specimen: BLOOD LEFT HAND  Result Value Ref Range   Specimen Description BLOOD LEFT HAND    Special Requests      BOTTLES DRAWN AEROBIC AND ANAEROBIC Blood Culture adequate volume Performed at [facility], [address] [place], [place] [address]    Culture PENDING    Report Status PENDING   CBG monitoring, ED     Status: Abnormal   Collection Time: 11/30/18  8:29 AM  Result Value Ref Range   Glucose-Capillary 118 (H) 70 - 99 mg/dL    Ct Abdomen Pelvis W Contrast  Result Date: 11/29/2018 CLINICAL DATA:  Acute abdominal pain and nausea and vomiting. Jaundice. Elevated liver enzymes. Pancreatitis. EXAM: CT ABDOMEN AND PELVIS WITH CONTRAST TECHNIQUE: Multidetector CT imaging of the abdomen and pelvis was performed using the standard protocol following bolus administration of intravenous contrast. CONTRAST:  100mL OMNIPAQUE IOHEXOL 300 MG/ML  SOLN COMPARISON:  11/11/2018 FINDINGS: Lower Chest: No acute findings. Hepatobiliary: No hepatic masses identified. Mild diffuse biliary ductal dilatation is increased since prior study. Gallbladder is unremarkable. Pancreas: Mild acute pancreatitis shows no significant change compared to prior study. A cystic lesion is seen in the pancreatic head measuring 4.5 x 3.8 cm, which is stable in size and most likely represents a pseudocyst. Spleen: Within normal limits in size and appearance. Adrenals/Urinary Tract: No masses identified. Right upper pole renal cyst is stable. No evidence of ureteral calculi or hydronephrosis. Unremarkable unopacified urinary bladder. Stomach/Bowel: No evidence of obstruction, inflammatory process or abnormal fluid collections. Normal appendix  visualized. Vascular/Lymphatic: Shotty sub-cm bilateral iliac lymph nodes show no significant change. No pathologically enlarged lymph nodes. No abdominal aortic aneurysm. Aortic atherosclerosis. Reproductive:  No mass or other significant abnormality. Other:  None. Musculoskeletal:  No suspicious bone lesions identified. IMPRESSION: 1. New mild diffuse biliary ductal dilatation. 2. No significant change in mild acute pancreatitis. 3. Stable 4.5 cm cystic lesion in pancreatic head, most likely representing a pseudocyst. Electronically Signed   By: Danae OrleansJohn A Stahl M.D.   On: 11/29/2018 19:43               Blood pressure (!) 162/77, pulse 76, temperature 98.3 F (36.8 C), temperature source Oral, resp. rate (!) 26, height 5\' 8"  (1.727  m), weight 127 kg, SpO2 93 %.  Physical exam:   General--obese white male no distress ENT--slightly icteric Neck--supple Heart--normal S1 and S2 with no murmurs or gallops regular rhythm Lungs--clear Abdomen--obese with good bowel sounds no tenderness Psych--alert and oriented answers questions appropriately mental state appropriate   Assessment: 1.  Alcoholic pancreatitis with pseudocyst.  The patient had been doing well for about 10 days after discharge until the past few days.  He began to have abdominal pain and liver tests were elevated.  I have reviewed the CT scan and is quite possible that the 4 cm pseudocyst in the head of his pancreas is partially compressing the bile duct.  His WBC was elevated and agree with going ahead with empiric antibiotics.  Plan: Would go ahead and keep him n.p.o. and give him antibiotics.  I noticed that he has another MRCP ordered is probably reasonable to go ahead with that.  If there is any suggestion that there could be a CBD stone he may need an endoscopic ultrasound.  He is previously had ultrasound, MRCP showing no evidence of bile duct or gallbladder stones. We will follow with you.   Tresea Mall 11/30/2018,  8:52 AM   This note was created using voice recognition software and minor errors may Have occurred unintentionally. Pager: [phone] If no answer or after hours call [phone]

## 2018-11-30 NOTE — ED Notes (Addendum)
Attempted report 

## 2018-11-30 NOTE — Plan of Care (Signed)

## 2018-12-01 ENCOUNTER — Inpatient Hospital Stay (HOSPITAL_COMMUNITY): Payer: 59 | Admitting: Certified Registered"

## 2018-12-01 ENCOUNTER — Encounter (HOSPITAL_COMMUNITY): Payer: Self-pay

## 2018-12-01 ENCOUNTER — Inpatient Hospital Stay (HOSPITAL_COMMUNITY): Payer: 59

## 2018-12-01 ENCOUNTER — Encounter (HOSPITAL_COMMUNITY)
Admission: EM | Disposition: A | Discharge status: Home / Self Care | Payer: Self-pay | Source: Home / Self Care | Attending: Internal Medicine

## 2018-12-01 HISTORY — PX: PANCREATIC STENT PLACEMENT: SHX5539

## 2018-12-01 HISTORY — PX: STENT REMOVAL: SHX6421

## 2018-12-01 HISTORY — PX: ERCP: SHX5425

## 2018-12-01 LAB — COMPREHENSIVE METABOLIC PANEL
ALT: 171 U/L — ABNORMAL HIGH (ref 0–44)
AST: 85 U/L — ABNORMAL HIGH (ref 15–41)
Albumin: 2.8 g/dL — ABNORMAL LOW (ref 3.5–5.0)
Alkaline Phosphatase: 231 U/L — ABNORMAL HIGH (ref 38–126)
Anion gap: 12 (ref 5–15)
BUN: <5 mg/dL — ABNORMAL LOW (ref 6–20)
CO2: 19 mmol/L — ABNORMAL LOW (ref 22–32)
Calcium: 8.3 mg/dL — ABNORMAL LOW (ref 8.9–10.3)
Chloride: 104 mmol/L (ref 98–111)
Creatinine, Ser: 0.55 mg/dL — ABNORMAL LOW (ref 0.61–1.24)
GFR calc Af Amer: >60 mL/min (ref >60)
GFR calc non Af Amer: >60 mL/min (ref >60)
Glucose, Bld: 97 mg/dL (ref 70–99)
Potassium: 3.5 mmol/L (ref 3.5–5.1)
Sodium: 135 mmol/L (ref 135–145)
Total Bilirubin: 12.2 mg/dL — ABNORMAL HIGH (ref 0.3–1.2)
Total Protein: 6.4 g/dL — ABNORMAL LOW (ref 6.5–8.1)

## 2018-12-01 LAB — GLUCOSE, CAPILLARY
Glucose-Capillary: 100 mg/dL — ABNORMAL HIGH (ref 70–99)
Glucose-Capillary: 93 mg/dL (ref 70–99)

## 2018-12-01 LAB — CBC
HCT: 39.2 % (ref 39.0–52.0)
Hemoglobin: 13.9 g/dL (ref 13.0–17.0)
MCH: 32.9 pg (ref 26.0–34.0)
MCHC: 35.5 g/dL (ref 30.0–36.0)
MCV: 92.9 fL (ref 80.0–100.0)
Platelets: 363 10*3/uL (ref 150–400)
RBC: 4.22 MIL/uL (ref 4.22–5.81)
RDW: 13.6 % (ref 11.5–15.5)
WBC: 11.5 10*3/uL — ABNORMAL HIGH (ref 4.0–10.5)
nRBC: 0 % (ref 0.0–0.2)

## 2018-12-01 LAB — LIPASE, BLOOD: Lipase: 211 U/L — ABNORMAL HIGH (ref 11–51)

## 2018-12-01 LAB — LACTIC ACID, PLASMA: Lactic Acid, Venous: 0.6 mmol/L (ref 0.5–1.9)

## 2018-12-01 IMAGING — RF DG ERCP WO/W SPHINCTEROTOMY
1 series · 1 of 1 positions shown · non-contrast
Comparison: None.

CLINICAL DATA: 57-year-old male status post unsuccessful ERCP

EXAM:
ERCP
TECHNIQUE: Multiple spot images obtained with the fluoroscopic device and
submitted for interpretation post-procedure.
FLUOROSCOPY TIME:  Fluoroscopy Time:  2 minutes 26 seconds

[Series 1: unknown protocol · 0.20mm/px · 1 of 1 slices shown]
[im 1/1]
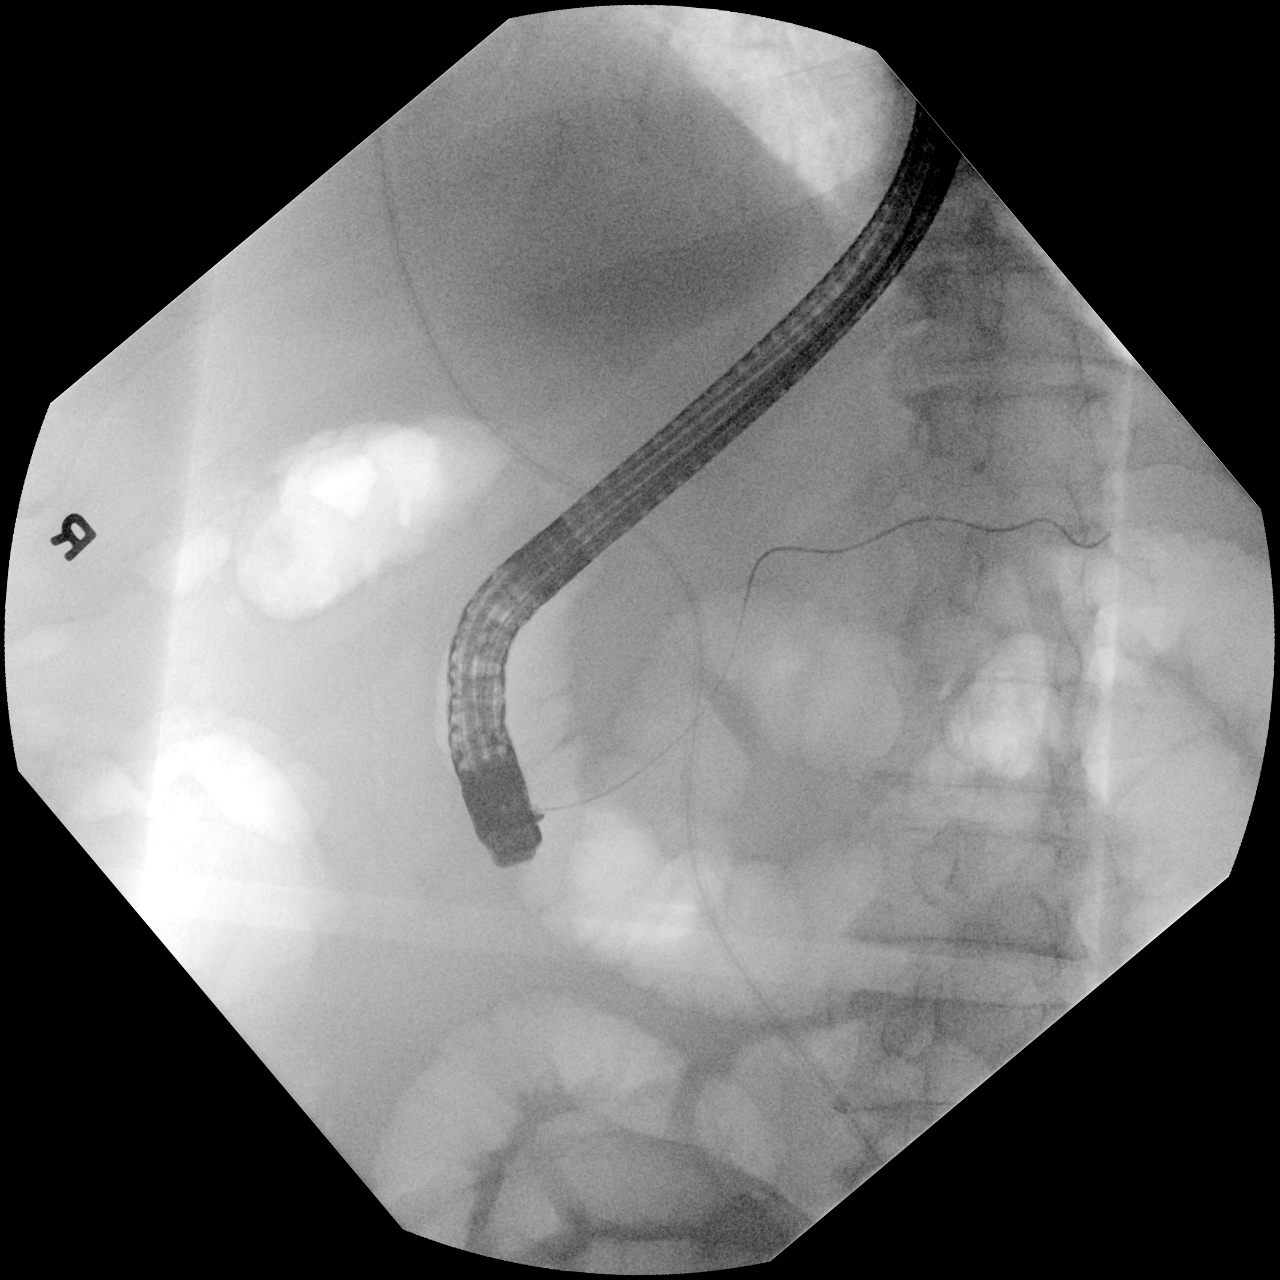

[1 of 1 positions shown; findings below may reference images not displayed]

FINDINGS: A single intra procedural saved image is submitted for review. The
image demonstrates a flexible endoscope in the descending duodenum
with wire cannulation of the pancreatic duct.
IMPRESSION: Wire cannulation of the pancreatic duct.

These images were submitted for radiologic interpretation only.
Please see the procedural report for the amount of contrast and the
fluoroscopy time utilized.

## 2018-12-01 SURGERY — ERCP, WITH INTERVENTION IF INDICATED
Anesthesia: General

## 2018-12-01 MED ORDER — MIDAZOLAM HCL 2 MG/2ML IJ SOLN
INTRAMUSCULAR | Status: DC | PRN
  Administered 2018-12-01: 2 mg via INTRAVENOUS

## 2018-12-01 MED ORDER — SUCCINYLCHOLINE CHLORIDE 20 MG/ML IJ SOLN
INTRAMUSCULAR | Status: DC | PRN
  Administered 2018-12-01: 180 mg via INTRAVENOUS

## 2018-12-01 MED ORDER — ROCURONIUM BROMIDE 10 MG/ML (PF) SYRINGE
PREFILLED_SYRINGE | INTRAVENOUS | Status: DC | PRN
  Administered 2018-12-01: 40 mg via INTRAVENOUS
  Administered 2018-12-01: 20 mg via INTRAVENOUS

## 2018-12-01 MED ORDER — PROPOFOL 10 MG/ML IV BOLUS
INTRAVENOUS | Status: DC | PRN
  Administered 2018-12-01: 200 mg via INTRAVENOUS

## 2018-12-01 MED ORDER — ONDANSETRON HCL 4 MG/2ML IJ SOLN
INTRAMUSCULAR | Status: DC | PRN
  Administered 2018-12-01: 4 mg via INTRAVENOUS

## 2018-12-01 MED ORDER — PANTOPRAZOLE SODIUM 40 MG IV SOLR
40.0000 mg | Freq: Two times a day (BID) | INTRAVENOUS | Status: DC
  Administered 2018-12-01 – 2018-12-03 (×5): 40 mg via INTRAVENOUS
  Filled 2018-12-01 (×5): qty 40

## 2018-12-01 MED ORDER — CIPROFLOXACIN IN D5W 400 MG/200ML IV SOLN
INTRAVENOUS | Status: AC
  Filled 2018-12-01: qty 200

## 2018-12-01 MED ORDER — SUGAMMADEX SODIUM 200 MG/2ML IV SOLN
INTRAVENOUS | Status: DC | PRN
  Administered 2018-12-01: 250 mg via INTRAVENOUS

## 2018-12-01 MED ORDER — FENTANYL CITRATE (PF) 100 MCG/2ML IJ SOLN
INTRAMUSCULAR | Status: DC | PRN
  Administered 2018-12-01 (×3): 50 ug via INTRAVENOUS

## 2018-12-01 MED ORDER — SODIUM CHLORIDE 0.9 % IV SOLN: INTRAVENOUS | Status: DC

## 2018-12-01 MED ORDER — LIDOCAINE 2% (20 MG/ML) 5 ML SYRINGE
INTRAMUSCULAR | Status: DC | PRN
  Administered 2018-12-01: 100 mg via INTRAVENOUS

## 2018-12-01 MED ORDER — GLUCAGON HCL RDNA (DIAGNOSTIC) 1 MG IJ SOLR
INTRAMUSCULAR | Status: DC | PRN
  Administered 2018-12-01 (×2): .5 mg via INTRAVENOUS

## 2018-12-01 MED ORDER — INDOMETHACIN 50 MG RE SUPP
RECTAL | Status: AC
  Filled 2018-12-01: qty 2

## 2018-12-01 MED ORDER — INDOMETHACIN 50 MG RE SUPP
RECTAL | Status: DC | PRN
  Administered 2018-12-01: 100 mg via RECTAL

## 2018-12-01 NOTE — Anesthesia Procedure Notes (Signed)
Procedure Name: Intubation Date/Time: 12/01/2018 3:17 PM Performed by: Barrington Ellison, CRNA Pre-anesthesia Checklist: Patient identified, Emergency Drugs available, Suction available and Patient being monitored Patient Re-evaluated:Patient Re-evaluated prior to induction Oxygen Delivery Method: Circle System Utilized Preoxygenation: Pre-oxygenation with 100% oxygen Induction Type: IV induction Ventilation: Mask ventilation without difficulty Laryngoscope Size: Mac and 4 Grade View: Grade III Tube type: Oral Tube size: 7.5 mm Number of attempts: 1 Airway Equipment and Method: Stylet and Oral airway Placement Confirmation: ETT inserted through vocal cords under direct vision,  positive ETCO2 and breath sounds checked- equal and bilateral Secured at: 22 cm Tube secured with: Tape Dental Injury: Teeth and Oropharynx as per pre-operative assessment

## 2018-12-01 NOTE — Op Note (Signed)
[facility] Patient Name: Philip FinesCraig Zukas Procedure Date : 12/01/2018 MRN: [id number] Attending MD: Kerin SalenArya Tab Rylee , MD Date of Birth: 1960-06-27 CSN: [id number] Age: [id number] Admit Type: Inpatient Procedure:                ERCP Indications:              Elevated liver enzymes, pancreatic head pseudocyst                            compressing the CBD and causing elevated LFTs Providers:                Kerin SalenArya Tenecia Ignasiak, MD, Margaree MackintoshHayleigh Westmoreland, RN, Arlee Muslimhris                            Chandler Tech., Technician, Jefm MilesJulie Rennie, CRNA Referring MD:              Medicines:                Monitored Anesthesia Care Complications:            No immediate complications. Estimated blood loss:                            None Estimated Blood Loss:     Estimated blood loss: none. Procedure:                Pre-Anesthesia Assessment:                           - Prior to the procedure, a History and Physical                            was performed, and patient medications and                            allergies were reviewed. The patient's tolerance of                            previous anesthesia was also reviewed. The risks                            and benefits of the procedure and the sedation                            options and risks were discussed with the patient.                            All questions were answered, and informed consent                            was obtained. Prior Anticoagulants: The patient has                            taken no previous anticoagulant or antiplatelet  agents. ASA Grade Assessment: III - A patient with                            severe systemic disease. After reviewing the risks                            and benefits, the patient was deemed in                            satisfactory condition to undergo the procedure.                           After obtaining informed consent, the scope was                            passed  under direct vision. Throughout the                            procedure, the patient's blood pressure, pulse, and                            oxygen saturations were monitored continuously. The                            [id number]-[id number] ([id number] Olympus Duodensocope was                            introduced through the mouth, and used to inject                            contrast into and used to cannulate the bile duct.                            The ERCP was technically difficult and complex due                            to challenging cannulation. The patient tolerated                            the procedure well. Scope In: Scope Out: Findings:      The scout film was normal. The esophagus was successfully intubated       under direct vision. The scope was advanced to a normal major papilla in       the descending duodenum without detailed examination of the pharynx,       larynx and associated structures, and upper GI tract. The upper GI tract       was grossly normal.      Gastric erosions and superficial ulcerations were noted in the duodenum.      The common bile duct could not be cannulated after multiple attempts       despite using standard and tapered sphincterotome.      The guidewire kept advancing into the pancreatic duct.      Several attempts were made to canule the CBD with a double wire  technique with the wire left in the pancreatic duct but canulation of       CBD was not possible.      I attempted to place a straight pancreatic duct stent but this was not       successful.      A pigtail pancreatic stent was left in pancreatic duct. Multiple       attempts were again made to cannulate the common bile duct with the       pancreatic stent in place, however, this was not successful.      One 4 Fr by 5 cm pancreatic stent with a full external pigtail and a       single internal pigtail was placed 4 cm into the ventral pancreatic       duct. Clear fluid flowed through  the stent. The stent was in good       position. Impression:               - One pancreatic stent was placed into the ventral                            pancreatic duct.                           CBD could not be canulated.                           Gastric erosions and duodenal ulcers. Moderate Sedation:      Patient did not receive moderate sedation for this procedure, but       instead received monitored anesthesia care. Recommendation:           - NPO.                           - Will retry ERCP in 1-2 days.                           IVF at 125 ml/hr.                           - LFTs in am.                           - PPI daily Procedure Code(s):        --- Professional ---                           [phone], Endoscopic retrograde                            cholangiopancreatography (ERCP); with placement of                            endoscopic stent into biliary or pancreatic duct,                            including pre- and post-dilation and guide wire  passage, when performed, including sphincterotomy,                            when performed, each stent Diagnosis Code(s):        --- Professional ---                           R74.8, Abnormal levels of other serum enzymes CPT copyright 2019 [employer]. All rights reserved. The codes documented in this report are preliminary and upon coder review may  be revised to meet current compliance requirements. Ronnette Juniper, MD 12/01/2018 5:24:19 PM This report has been signed electronically. Number of Addenda: 0

## 2018-12-01 NOTE — Anesthesia Preprocedure Evaluation (Signed)
Anesthesia Evaluation  Patient identified by MRN, date of birth, ID band Patient awake    Reviewed: Allergy & Precautions, H&P , NPO status , Patient's Chart, lab work & pertinent test results, reviewed documented beta blocker date and time   Airway Mallampati: III  TM Distance: >3 FB Neck ROM: full    Dental no notable dental hx. (+) Caps, Teeth Intact   Pulmonary sleep apnea and Continuous Positive Airway Pressure Ventilation , Current Smoker,    Pulmonary exam normal breath sounds clear to auscultation       Cardiovascular Exercise Tolerance: Good hypertension, Pt. on medications negative cardio ROS   Rhythm:regular Rate:Normal     Neuro/Psych negative neurological ROS  negative psych ROS   GI/Hepatic negative GI ROS, Neg liver ROS,   Endo/Other  negative endocrine ROS  Renal/GU negative Renal ROS  negative genitourinary   Musculoskeletal   Abdominal   Peds  Hematology negative hematology ROS (+)   Anesthesia Other Findings   Reproductive/Obstetrics negative OB ROS                             Anesthesia Physical Anesthesia Plan  ASA: III and emergent  Anesthesia Plan: General   Post-op Pain Management:    Induction:   PONV Risk Score and Plan: 2 and Ondansetron and Treatment may vary due to age or medical condition  Airway Management Planned: LMA and Oral ETT  Additional Equipment:   Intra-op Plan:   Post-operative Plan:   Informed Consent: I have reviewed the patients History and Physical, chart, labs and discussed the procedure including the risks, benefits and alternatives for the proposed anesthesia with the patient or authorized representative who has indicated his/her understanding and acceptance.     Dental Advisory Given  Plan Discussed with: CRNA, Anesthesiologist and Surgeon  Anesthesia Plan Comments:         Anesthesia Quick Evaluation

## 2018-12-01 NOTE — Op Note (Addendum)
ERCP was performed for pancreatic head pseudocyst compressing on the common bile duct causing elevated liver enzyme.   Findings: Multiple gastric erosions and duodenal ulcers. Pancreatic duct was cannulated on several attempts despite using a tapered and a standard sphincterotome. Multiple attempts were made to cannulate the common bile duct, using a double wire technique, and even after after placing a pancreatic stent, without success. A 4 French 5 cm plastic pancreatic stent was left in place.   Recommendations: NPO IV fluids LFTs a.m. Reattempt ERCP in 1 to 2 days.  Ronnette Juniper, MD

## 2018-12-01 NOTE — Interval H&P Note (Signed)
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  12/01/2018 2:47 PM  Philip Rhodes  has presented today for ERCP, with the diagnosis of Pancreatic pseudocyst, biliary obstruction.  The various methods of treatment have been discussed with the patient and family. After consideration of risks, benefits and other options for treatment, the patient has consented to  Procedure(s): ENDOSCOPIC RETROGRADE CHOLANGIOPANCREATOGRAPHY (ERCP) (N/A) as a surgical intervention.  The patient's history has been reviewed, patient examined, no change in status, stable for surgery.  I have reviewed the patient's chart and labs.  Questions were answered to the patient's satisfaction.     Ronnette Juniper

## 2018-12-01 NOTE — Brief Op Note (Signed)
11/29/2018 - 12/01/2018  5:24 PM  PATIENT:  Philip Rhodes  58 y.o. male  PRE-OPERATIVE DIAGNOSIS:  Pancreatic pseudocyst, biliary obstruction  POST-OPERATIVE DIAGNOSIS:  ERCP- unsuccessful cannulation of CBD. Pancreatic stent placed   PROCEDURE:  Procedure(s): ENDOSCOPIC RETROGRADE CHOLANGIOPANCREATOGRAPHY (ERCP) (N/A) PANCREATIC STENT PLACEMENT STENT REMOVAL  SURGEON:  Surgeon(s) and Role:    Ronnette Juniper, MD - Primary  PHYSICIAN ASSISTANT:   ASSISTANTS:Hayleigh Westmoreland, RN, Elspeth Cho, Tech   ANESTHESIA:   MAC  EBL:  Minimal  BLOOD ADMINISTERED:none  DRAINS: none   LOCAL MEDICATIONS USED:  NONE  SPECIMEN:  Biopsy / Limited Resection  DISPOSITION OF SPECIMEN:  None  COUNTS:  YES  TOURNIQUET:  * No tourniquets in log *  DICTATION: .Dragon Dictation  PLAN OF CARE: Admit to inpatient   PATIENT DISPOSITION:  PACU - hemodynamically stable.   Delay start of Pharmacological VTE agent (>24hrs) due to surgical blood loss or risk of bleeding: no

## 2018-12-01 NOTE — Progress Notes (Signed)
PROGRESS NOTE    Philip Rhodes  FIE:[id number] DOB: [birth year] DOA: 11/29/2018 PCP: [name], MD    Brief Narrative:  57 year old gentleman with prior history of hypertension, hyperlipidemia, obstructive sleep apnea on CPAP, tobacco abuse, alcohol abuse recently admitted for acute pancreatitis and discharged on 11/11/2018.  He was recently seen by his PCP and was found to have a bilirubin of 10 and abnormal liver enzymes he was therefore referred to ED for further evaluation.  Patient also reports that he continues to have some mild abdominal discomfort and nausea intermittently.  On arrival to ED he underwent a CT abdomen and pelvis which showed mild diffuse biliary ductal dilatation and a stable 4.5 cystic lesion in the pancreatic head most likely representing a pseudocyst.  [facility] gastroenterology consulted and will see the patient today. Patient seen and examined today he reports abdominal discomfort is minimal, no nausea or vomiting today. He would like the ice chips.   Assessment & Plan:   Principal Problem:   Elevated LFTs Active Problems:   Essential hypertension   Pancreatitis   Hyponatremia   SIRS (systemic inflammatory response syndrome) (HCC)   Hyperbilirubinemia   Tobacco abuse   History of alcohol abuse   Elevated liver enzymes and hyperbilirubinemia -MRCP concerning for narrowing at CBD -ERCP pending later today with GI, defer to their expertise for further evaluation and treatment -Patient will remain n.p.o. and continue with empiric antibiotics with IV Zosyn. -Follow blood cultures and symptomatic management with IV fluids, IV antiemetics and pain control.  -AST/ALT downtrending appropriately with IV fluids -Alk phos elevated at 231;, downtrending from admission at 256 -Today total bilirubin level increased to 12 and direct bilirubin is 7.3.  Essential hypertension -Blood pressure somewhat elevated in the setting of abdominal pain, currently n.p.o. -Resume  home medications once cleared by GI post procedure today  History of alcohol abuse -Patient reports he is abstinent from alcohol for  the last 4 weeks after previous episode of pancreatitis  Tobacco abuse -Counseled and encouraged to quit.   Hyponatremia probably from slight dehydration. -Gently hydrated overnight and his sodium is within normal limits.  Pancreatitis, history of - appears to be somewhat chronic on repeat imaging -Lipase minimally elevated, currently 211, 288 at admission, as high as 390 last week on previous admission  Leukemoid reaction -Likely reactive, continue empiric antibiotics as above, will likely discontinue pending findings on ERCP   DVT prophylaxis: Lovenox Code Status: Full code Family Communication: None at bedside Disposition Plan: Pending clinical improvement and further evaluation  Consultants:  Gastroenterology Dr. Oletta Lamas Procedures: MRCP/ERCP Antimicrobials: IV Zosyn>> ongoing since admission  Subjective: No nausea, abdominal discomfort is improving.  Objective: Vitals:   11/30/18 2101 11/30/18 2259 11/30/18 2346 12/01/18 0521  BP: (!) 183/90  (!) 150/83 (!) 157/82  Pulse: 73 88 73 71  Resp: 19   19  Temp: 98.4 F (36.9 C)   98.2 F (36.8 C)  TempSrc: Oral   Oral  SpO2: 100% 100%  97%  Weight:      Height:        Intake/Output Summary (Last 24 hours) at 12/01/2018 0803 Last data filed at 12/01/2018 0522 Gross per 24 hour  Intake 1250.07 ml  Output --  Net 1250.07 ml   Filed Weights   11/29/18 2051  Weight: 127 kg    Examination: General:  Pleasantly resting in bed, No acute distress. HEENT:  Normocephalic atraumatic.  Sclerae nonicteric, noninjected.  Extraocular movements intact bilaterally. Neck:  Without mass or  deformity.  Trachea is midline. Lungs:  Clear to auscultate bilaterally without rhonchi, wheeze, or rales. Heart:  Regular rate and rhythm.  Without murmurs, rubs, or gallops. Abdomen:  Soft, nontender,  nondistended.  Without guarding or rebound. Extremities: Without cyanosis, clubbing, edema, or obvious deformity. Vascular:  Dorsalis pedis and posterior tibial pulses palpable bilaterally. Skin:  Warm and dry, no erythema, no ulcerations.   Data Reviewed: I have personally reviewed following labs and imaging studies  CBC: Recent Labs  Lab 11/29/18 1514 11/30/18 0329 12/01/18 0126  WBC 14.2* 12.8* 11.5*  NEUTROABS 9.9*  --   --   HGB 15.8 14.8 13.9  HCT 44.0 42.7 39.2  MCV 92.1 94.3 92.9  PLT 406* 401* 578   Basic Metabolic Panel: Recent Labs  Lab 11/29/18 1514 11/30/18 0329 12/01/18 0126  NA 130* 135 135  K 3.7 4.0 3.5  CL 97* 103 104  CO2 21* 20* 19*  GLUCOSE 141* 130* 97  BUN <5* 5* <5*  CREATININE 0.51* 0.56* 0.55*  CALCIUM 9.0 8.6* 8.3*   GFR: Estimated Creatinine Clearance: 132.3 mL/min (A) (by C-G formula based on SCr of 0.55 mg/dL (L)). Liver Function Tests: Recent Labs  Lab 11/29/18 1514 11/30/18 0329 12/01/18 0126  AST 138* 112* 85*  ALT 251* 213* 171*  ALKPHOS 256* 218* 231*  BILITOT 10.7* 11.5* 12.2*  PROT 7.7 6.9 6.4*  ALBUMIN 3.4* 3.0* 2.8*   Recent Labs  Lab 11/29/18 1514 12/01/18 0126  LIPASE 288* 211*   No results for input(s): AMMONIA in the last 168 hours. Coagulation Profile: Recent Labs  Lab 11/29/18 1831 11/30/18 0329  INR 1.1 1.1   Cardiac Enzymes: No results for input(s): CKTOTAL, CKMB, CKMBINDEX, TROPONINI in the last 168 hours. BNP (last 3 results) No results for input(s): PROBNP in the last 8760 hours. HbA1C: No results for input(s): HGBA1C in the last 72 hours. CBG: Recent Labs  Lab 11/30/18 0829 12/01/18 0801  GLUCAP 118* 100*   Lipid Profile: No results for input(s): CHOL, HDL, LDLCALC, TRIG, CHOLHDL, LDLDIRECT in the last 72 hours. Thyroid Function Tests: No results for input(s): TSH, T4TOTAL, FREET4, T3FREE, THYROIDAB in the last 72 hours. Anemia Panel: No results for input(s): VITAMINB12, FOLATE,  FERRITIN, TIBC, IRON, RETICCTPCT in the last 72 hours. Sepsis Labs: Recent Labs  Lab 11/30/18 0329 12/01/18 0223  PROCALCITON <0.10  --   LATICACIDVEN  --  0.6    Recent Results (from the past 240 hour(s))  SARS Coronavirus 2 [facility] order, Performed in [facility] hospital lab) Nasopharyngeal Nasopharyngeal Swab     Status: None   Collection Time: 11/29/18  5:27 PM   Specimen: Nasopharyngeal Swab  Result Value Ref Range Status   SARS Coronavirus 2 NEGATIVE NEGATIVE Final    Comment: (NOTE) If result is NEGATIVE SARS-CoV-2 target nucleic acids are NOT DETECTED. The SARS-CoV-2 RNA is generally detectable in upper and lower  respiratory specimens during the acute phase of infection. The lowest  concentration of SARS-CoV-2 viral copies this assay can detect is 250  copies / mL. A negative result does not preclude SARS-CoV-2 infection  and should not be used as the sole basis for treatment or other  patient management decisions.  A negative result may occur with  improper specimen collection / handling, submission of specimen other  than nasopharyngeal swab, presence of viral mutation(s) within the  areas targeted by this assay, and inadequate number of viral copies  (<250 copies / mL). A negative result must be combined  with clinical  observations, patient history, and epidemiological information. If result is POSITIVE SARS-CoV-2 target nucleic acids are DETECTED. The SARS-CoV-2 RNA is generally detectable in upper and lower  respiratory specimens dur ing the acute phase of infection.  Positive  results are indicative of active infection with SARS-CoV-2.  Clinical  correlation with patient history and other diagnostic information is  necessary to determine patient infection status.  Positive results do  not rule out bacterial infection or co-infection with other viruses. If result is PRESUMPTIVE POSTIVE SARS-CoV-2 nucleic acids MAY BE PRESENT.   A presumptive positive result  was obtained on the submitted specimen  and confirmed on repeat testing.  While 2019 novel coronavirus  (SARS-CoV-2) nucleic acids may be present in the submitted sample  additional confirmatory testing may be necessary for epidemiological  and / or clinical management purposes  to differentiate between  SARS-CoV-2 and other Sarbecovirus currently known to infect humans.  If clinically indicated additional testing with an alternate test  methodology [phone]) is advised. The SARS-CoV-2 RNA is generally  detectable in upper and lower respiratory sp ecimens during the acute  phase of infection. The expected result is Negative. Fact Sheet for Patients:  StrictlyIdeas.no Fact Sheet for Healthcare Providers: BankingDealers.co.za This test is not yet approved or cleared by the [place] FDA and has been authorized for detection and/or diagnosis of SARS-CoV-2 by FDA under an Emergency Use Authorization (EUA).  This EUA will remain in effect (meaning this test can be used) for the duration of the COVID-19 declaration under Section 564(b)(1) of the Act, 21 U.S.C. section 360bbb-3(b)(1), unless the authorization is terminated or revoked sooner. Performed at [facility], [place] [address], [place], [place] [address]   Culture, blood (Routine X 2) w Reflex to ID Panel     Status: None (Preliminary result)   Collection Time: 11/30/18  3:33 AM   Specimen: BLOOD LEFT HAND  Result Value Ref Range Status   Specimen Description BLOOD LEFT HAND  Final   Special Requests   Final    BOTTLES DRAWN AEROBIC AND ANAEROBIC Blood Culture adequate volume Performed at [facility], [place] [address] [place], [place] [address]    Culture PENDING  Incomplete   Report Status PENDING  Incomplete         Radiology Studies: Ct Abdomen Pelvis W Contrast  Result Date: 11/29/2018 CLINICAL DATA:  Acute abdominal pain and nausea and vomiting. Jaundice.  Elevated liver enzymes. Pancreatitis. EXAM: CT ABDOMEN AND PELVIS WITH CONTRAST TECHNIQUE: Multidetector CT imaging of the abdomen and pelvis was performed using the standard protocol following bolus administration of intravenous contrast. CONTRAST:  1104m OMNIPAQUE IOHEXOL 300 MG/ML  SOLN COMPARISON:  11/11/2018 FINDINGS: Lower Chest: No acute findings. Hepatobiliary: No hepatic masses identified. Mild diffuse biliary ductal dilatation is increased since prior study. Gallbladder is unremarkable. Pancreas: Mild acute pancreatitis shows no significant change compared to prior study. A cystic lesion is seen in the pancreatic head measuring 4.5 x 3.8 cm, which is stable in size and most likely represents a pseudocyst. Spleen: Within normal limits in size and appearance. Adrenals/Urinary Tract: No masses identified. Right upper pole renal cyst is stable. No evidence of ureteral calculi or hydronephrosis. Unremarkable unopacified urinary bladder. Stomach/Bowel: No evidence of obstruction, inflammatory process or abnormal fluid collections. Normal appendix visualized. Vascular/Lymphatic: Shotty sub-cm bilateral iliac lymph nodes show no significant change. No pathologically enlarged lymph nodes. No abdominal aortic aneurysm. Aortic atherosclerosis. Reproductive:  No mass or other significant  abnormality. Other:  None. Musculoskeletal:  No suspicious bone lesions identified. IMPRESSION: 1. New mild diffuse biliary ductal dilatation. 2. No significant change in mild acute pancreatitis. 3. Stable 4.5 cm cystic lesion in pancreatic head, most likely representing a pseudocyst. Electronically Signed   By: Marlaine Hind M.D.   On: 11/29/2018 19:43   Mr Abdomen Mrcp Wo Contrast  Result Date: 11/30/2018 CLINICAL DATA:  Abdominal pain. Nausea and vomiting. Jaundice. Acute pancreatitis with pseudocyst. EXAM: MRI ABDOMEN WITHOUT CONTRAST  (INCLUDING MRCP) TECHNIQUE: Multiplanar multisequence MR imaging of the abdomen was  performed. Heavily T2-weighted images of the biliary and pancreatic ducts were obtained, and three-dimensional MRCP images were rendered by post processing. COMPARISON:  CT on 11/29/2018 FINDINGS: Lower chest: No acute findings. Hepatobiliary: No masses visualized on this unenhanced exam. Gallbladder is unremarkable. Mild diffuse biliary ductal dilatation is again seen, with common bile duct measuring approximately 8 mm. No evidence of choledocholithiasis. Smoothly tapered stricture of the distal common bile duct is seen adjacent to cystic lesion in the pancreatic head. Pancreas: Mild acute pancreatitis is stable in appearance since previous study. A well-circumscribed cystic lesion is again seen in the pancreatic head, measuring 4.5 x 4.1 cm, also stable. This is most consistent with a pseudocyst. Beaded appearance of the main pancreatic duct is noted, which is suspicious for chronic pancreatitis. Spleen:  Within normal limits in size. Adrenals/Urinary tract: Stable approximately 5 cm benign-appearing cyst in upper pole of right kidney. No evidence of hydronephrosis. Stomach/Bowel: Visualized portion unremarkable. Vascular/Lymphatic: No pathologically enlarged lymph nodes identified. No evidence of abdominal aortic aneurysm. Other:  None. Musculoskeletal:  No suspicious bone lesions identified. IMPRESSION: 1. Stable mild acute pancreatitis, superimposed on changes of chronic pancreatitis. 2. Stable 4.5 cm cystic lesion in pancreatic head, most consistent with pseudocyst. 3. Stable mild biliary ductal dilatation, with smoothly tapered stricture in the distal common bile duct adjacent to cystic pancreatic lesion. No evidence of choledocholithiasis. Electronically Signed   By: Marlaine Hind M.D.   On: 11/30/2018 16:47   Mr 3d Recon At Scanner  Result Date: 11/30/2018 CLINICAL DATA:  Abdominal pain. Nausea and vomiting. Jaundice. Acute pancreatitis with pseudocyst. EXAM: MRI ABDOMEN WITHOUT CONTRAST  (INCLUDING  MRCP) TECHNIQUE: Multiplanar multisequence MR imaging of the abdomen was performed. Heavily T2-weighted images of the biliary and pancreatic ducts were obtained, and three-dimensional MRCP images were rendered by post processing. COMPARISON:  CT on 11/29/2018 FINDINGS: Lower chest: No acute findings. Hepatobiliary: No masses visualized on this unenhanced exam. Gallbladder is unremarkable. Mild diffuse biliary ductal dilatation is again seen, with common bile duct measuring approximately 8 mm. No evidence of choledocholithiasis. Smoothly tapered stricture of the distal common bile duct is seen adjacent to cystic lesion in the pancreatic head. Pancreas: Mild acute pancreatitis is stable in appearance since previous study. A well-circumscribed cystic lesion is again seen in the pancreatic head, measuring 4.5 x 4.1 cm, also stable. This is most consistent with a pseudocyst. Beaded appearance of the main pancreatic duct is noted, which is suspicious for chronic pancreatitis. Spleen:  Within normal limits in size. Adrenals/Urinary tract: Stable approximately 5 cm benign-appearing cyst in upper pole of right kidney. No evidence of hydronephrosis. Stomach/Bowel: Visualized portion unremarkable. Vascular/Lymphatic: No pathologically enlarged lymph nodes identified. No evidence of abdominal aortic aneurysm. Other:  None. Musculoskeletal:  No suspicious bone lesions identified. IMPRESSION: 1. Stable mild acute pancreatitis, superimposed on changes of chronic pancreatitis. 2. Stable 4.5 cm cystic lesion in pancreatic head, most consistent with pseudocyst. 3. Stable  mild biliary ductal dilatation, with smoothly tapered stricture in the distal common bile duct adjacent to cystic pancreatic lesion. No evidence of choledocholithiasis. Electronically Signed   By: Marlaine Hind M.D.   On: 11/30/2018 16:47        Scheduled Meds:  amLODipine  5 mg Oral Daily   calcium carbonate  1 tablet Oral Daily   multivitamin with  minerals  1 tablet Oral Daily   nicotine  21 mg Transdermal Daily   omega-3 acid ethyl esters  1 g Oral Daily   vitamin C  500 mg Oral Daily   Continuous Infusions:  sodium chloride 125 mL/hr at 12/01/18 0116   piperacillin-tazobactam (ZOSYN)  IV 3.375 g (12/01/18 0336)     LOS: 2 days    Time spent: 36  min  Little Ishikawa, DO [facility]  If 7PM-7AM, please contact night-coverage [email or web address] Password [email or web address] 12/01/2018, 8:03 AM

## 2018-12-01 NOTE — Progress Notes (Addendum)
Placed CPAP machine at bedside for patient.  Plugged machine into outlet and checked water level for tonight.  Patient stated that he can self manage machine for tonight.  No distress noted.

## 2018-12-01 NOTE — Transfer of Care (Signed)
Immediate Anesthesia Transfer of Care Note  Patient: Philip Rhodes  Procedure(s) Performed: ENDOSCOPIC RETROGRADE CHOLANGIOPANCREATOGRAPHY (ERCP) (N/A )  Patient Location: PACU  Anesthesia Type:General  Level of Consciousness: awake, alert  and oriented  Airway & Oxygen Therapy: Patient Spontanous Breathing  Post-op Assessment: Report given to RN  Post vital signs: Reviewed and stable  Last Vitals:  Vitals Value Taken Time  BP    Temp    Pulse    Resp    SpO2      Last Pain:  Vitals:   12/01/18 1423  TempSrc: Oral  PainSc: 0-No pain         Complications: No apparent anesthesia complications

## 2018-12-02 ENCOUNTER — Encounter (HOSPITAL_COMMUNITY): Payer: Self-pay | Admitting: Gastroenterology

## 2018-12-02 DIAGNOSIS — Z720 Tobacco use: Secondary | ICD-10-CM

## 2018-12-02 DIAGNOSIS — K852 Alcohol induced acute pancreatitis without necrosis or infection: Secondary | ICD-10-CM

## 2018-12-02 DIAGNOSIS — K831 Obstruction of bile duct: Principal | ICD-10-CM

## 2018-12-02 LAB — CBC
HCT: 38.1 % — ABNORMAL LOW (ref 39.0–52.0)
Hemoglobin: 12.8 g/dL — ABNORMAL LOW (ref 13.0–17.0)
MCH: 32.2 pg (ref 26.0–34.0)
MCHC: 33.6 g/dL (ref 30.0–36.0)
MCV: 95.7 fL (ref 80.0–100.0)
Platelets: 360 10*3/uL (ref 150–400)
RBC: 3.98 MIL/uL — ABNORMAL LOW (ref 4.22–5.81)
RDW: 14.1 % (ref 11.5–15.5)
WBC: 11.3 10*3/uL — ABNORMAL HIGH (ref 4.0–10.5)
nRBC: 0 % (ref 0.0–0.2)

## 2018-12-02 LAB — COMPREHENSIVE METABOLIC PANEL
ALT: 131 U/L — ABNORMAL HIGH (ref 0–44)
AST: 72 U/L — ABNORMAL HIGH (ref 15–41)
Albumin: 2.4 g/dL — ABNORMAL LOW (ref 3.5–5.0)
Alkaline Phosphatase: 213 U/L — ABNORMAL HIGH (ref 38–126)
Anion gap: 11 (ref 5–15)
BUN: 9 mg/dL (ref 6–20)
CO2: 17 mmol/L — ABNORMAL LOW (ref 22–32)
Calcium: 8.2 mg/dL — ABNORMAL LOW (ref 8.9–10.3)
Chloride: 107 mmol/L (ref 98–111)
Creatinine, Ser: 0.63 mg/dL (ref 0.61–1.24)
GFR calc Af Amer: >60 mL/min (ref >60)
GFR calc non Af Amer: >60 mL/min (ref >60)
Glucose, Bld: 90 mg/dL (ref 70–99)
Potassium: 3.9 mmol/L (ref 3.5–5.1)
Sodium: 135 mmol/L (ref 135–145)
Total Bilirubin: 12.1 mg/dL — ABNORMAL HIGH (ref 0.3–1.2)
Total Protein: 5.9 g/dL — ABNORMAL LOW (ref 6.5–8.1)

## 2018-12-02 LAB — GLUCOSE, CAPILLARY: Glucose-Capillary: 77 mg/dL (ref 70–99)

## 2018-12-02 MED ORDER — ENOXAPARIN SODIUM 40 MG/0.4ML ~~LOC~~ SOLN
40.0000 mg | SUBCUTANEOUS | Status: DC
  Administered 2018-12-02 – 2018-12-04 (×3): 40 mg via SUBCUTANEOUS
  Filled 2018-12-02 (×3): qty 0.4

## 2018-12-02 NOTE — Progress Notes (Addendum)
PROGRESS NOTE    Philip Rhodes  ZOX:[id number] DOB: [birth year] DOA: 11/29/2018 PCP: Elias Elseeade, Robert, MD   Brief Narrative:  58 y.o. WM PMHx hypertension, hyperlipidemia, OSA on CPAP, tobacco abuse, alcohol abuse (quit 4 weeks ago), who presents with abnormal lab.  Patient was recently hospitalized from 8/27-8/21 due to acute pancreatitis. GI was consulted and etiology was not clear. Pt had MRI of the abdomen with and without contrast/MRCP which showed acute pancreatitis and complex nonenhancing cystic lesion in the pancreatic head measures 4.4 x 3.7 x 4.0 cm, and demonstrates eccentric nodular hemorrhagic/proteinaceous debris, most compatible with a pancreatic pseudocyst, stable in size since recent 11/11/2018. Pt also had CT on 8/27 scan which showed no gallstones, gallbladder wall thickening, or biliary dilatation. Pt was discharged in stable condition.  His Bp med, Prinzide was hold due to pancreatitis.  Pt states that he had follow up visit with his primary physician and had lab done today, which showed elevated bilirubin up to 10 and abnormal liver function. He therefore was sent to ED for further evaluation treatment.  Patient states that he had intermittent mild nausea and minimal abdominal discomfort, which has resolved currently.  Currently patient does not have nausea, vomiting or diarrhea.  Denies fever or chills.  Patient does not have chest pain, shortness breath, cough, symptoms of UTI or unilateral weakness.  Patient used to be a heavy drinker, but he quit drinking alcohol 4 weeks ago.   Subjective: 9/17 A/O x4, negative CP, negative S OB, negative abdominal pain, negative N/V   Assessment & Plan:   Principal Problem:   Elevated LFTs Active Problems:   Essential hypertension   Pancreatitis   Hyponatremia   SIRS (systemic inflammatory response syndrome) (HCC)   Hyperbilirubinemia   Tobacco abuse   History of alcohol abuse   Elevated liver enzymes and  hyperbilirubinemia -MRCP concerning for narrowing at CBD -9/16 ERCP; unable to place stent in common bile duct but placed a pancreatic stent.  -9/17 GI has decided to hold off on a.m. ERCP.  Will decide over the next couple days whether to repeat ERCP. -Continue antibiotics per GI - 9/17 patient's diet advanced to ice chips.  Hx pancreatitis - Currently abdominal pain significantly improved.  Lipase pending  Hx EtOH abuse  - Discontinued alcohol 4 weeks ago after last episode of pancreatitis  Essential HTN - Slightly elevated but improved - Hydralazine IV PRN - Once cleared for p.o. we will resume patient's home medications.  Tobacco abuse - Patient counseled on need to discontinue.  Hyponatremia -Resolved  Leukocytosis VS leukemoid reaction - See elevated liver enzymes     DVT prophylaxis: Lovenox Code Status: Full Family Communication: 9/17 spoke with Vassie LollBeth Gary (wife) explained plan of care answered all questions Disposition Plan: PT   Consultants:  [facility]     Procedures/Significant Events:  9/16 ERCP;decompress the bile duct yesterday by Dr. Marca AnconaKarki.  Unfortunately, she was unable to get into the bile duct to place a stent and did place a pancreatic stent.   I have personally reviewed and interpreted all radiology studies and my findings are as above.  VENTILATOR SETTINGS:    Cultures   Antimicrobials: Anti-infectives (From admission, onward)   Start     Stop   11/30/18 0330  piperacillin-tazobactam (ZOSYN) IVPB 3.375 g         11/29/18 2045  piperacillin-tazobactam (ZOSYN) IVPB 3.375 g     11/29/18 2214       Devices    LINES /  TUBES:      Continuous Infusions:  sodium chloride 125 mL/hr at 12/02/18 0020   piperacillin-tazobactam (ZOSYN)  IV 3.375 g (12/02/18 0452)     Objective: Vitals:   12/01/18 1730 12/01/18 1745 12/01/18 2106 12/02/18 0455  BP: 136/75 (!) 147/74 (!) 148/79 122/71  Pulse: 68 60 64 (!) 51  Resp: 17  19 18 19   Temp:  97.7 F (36.5 C) 98.3 F (36.8 C) 97.8 F (36.6 C)  TempSrc:   Oral Oral  SpO2: 98% 96% 99% 99%  Weight:      Height:        Intake/Output Summary (Last 24 hours) at 12/02/2018 0858 Last data filed at 12/02/2018 3354 Gross per 24 hour  Intake 1751.39 ml  Output --  Net 1751.39 ml   Filed Weights   11/29/18 2051  Weight: 127 kg    Examination:  General: A/O x4, no acute respiratory distress Eyes: negative scleral hemorrhage, negative anisocoria, negative icterus ENT: Negative Runny nose, negative gingival bleeding, Neck:  Negative scars, masses, torticollis, lymphadenopathy, JVD Lungs: Clear to auscultation bilaterally without wheezes or crackles Cardiovascular: Regular rate and rhythm without murmur gallop or rub normal S1 and S2 Abdomen: OBESE, negative abdominal pain, nondistended, positive soft, bowel sounds, no rebound, no ascites, no appreciable mass Extremities: No significant cyanosis, clubbing, or edema bilateral lower extremities Skin: Negative rashes, lesions, ulcers Psychiatric:  Negative depression, negative anxiety, negative fatigue, negative mania  Central nervous system:  Cranial nerves II through XII intact, tongue/uvula midline, all extremities muscle strength 5/5, sensation intact throughout, negative dysarthria, negative expressive aphasia, negative receptive aphasia.  .     Data Reviewed: Care during the described time interval was provided by me .  I have reviewed this patient's available data, including medical history, events of note, physical examination, and all test results as part of my evaluation.   CBC: Recent Labs  Lab 11/29/18 1514 11/30/18 0329 12/01/18 0126 12/02/18 0105  WBC 14.2* 12.8* 11.5* 11.3*  NEUTROABS 9.9*  --   --   --   HGB 15.8 14.8 13.9 12.8*  HCT 44.0 42.7 39.2 38.1*  MCV 92.1 94.3 92.9 95.7  PLT 406* 401* 363 360   Basic Metabolic Panel: Recent Labs  Lab 11/29/18 1514 11/30/18 0329  12/01/18 0126 12/02/18 0105  NA 130* 135 135 135  K 3.7 4.0 3.5 3.9  CL 97* 103 104 107  CO2 21* 20* 19* 17*  GLUCOSE 141* 130* 97 90  BUN <5* 5* <5* 9  CREATININE 0.51* 0.56* 0.55* 0.63  CALCIUM 9.0 8.6* 8.3* 8.2*   GFR: Estimated Creatinine Clearance: 132.3 mL/min (by C-G formula based on SCr of 0.63 mg/dL). Liver Function Tests: Recent Labs  Lab 11/29/18 1514 11/30/18 0329 12/01/18 0126 12/02/18 0105  AST 138* 112* 85* 72*  ALT 251* 213* 171* 131*  ALKPHOS 256* 218* 231* 213*  BILITOT 10.7* 11.5* 12.2* 12.1*  PROT 7.7 6.9 6.4* 5.9*  ALBUMIN 3.4* 3.0* 2.8* 2.4*   Recent Labs  Lab 11/29/18 1514 12/01/18 0126  LIPASE 288* 211*   No results for input(s): AMMONIA in the last 168 hours. Coagulation Profile: Recent Labs  Lab 11/29/18 1831 11/30/18 0329  INR 1.1 1.1   Cardiac Enzymes: No results for input(s): CKTOTAL, CKMB, CKMBINDEX, TROPONINI in the last 168 hours. BNP (last 3 results) No results for input(s): PROBNP in the last 8760 hours. HbA1C: No results for input(s): HGBA1C in the last 72 hours. CBG: Recent Labs  Lab 11/30/18 [phone]  12/01/18 0801 12/01/18 1720 12/02/18 0827  GLUCAP 118* 100* 93 77   Lipid Profile: No results for input(s): CHOL, HDL, LDLCALC, TRIG, CHOLHDL, LDLDIRECT in the last 72 hours. Thyroid Function Tests: No results for input(s): TSH, T4TOTAL, FREET4, T3FREE, THYROIDAB in the last 72 hours. Anemia Panel: No results for input(s): VITAMINB12, FOLATE, FERRITIN, TIBC, IRON, RETICCTPCT in the last 72 hours. Urine analysis:    Component Value Date/Time   COLORURINE AMBER (A) 11/29/2018 1659   APPEARANCEUR CLEAR 11/29/2018 1659   LABSPEC 1.015 11/29/2018 1659   PHURINE 5.0 11/29/2018 1659   GLUCOSEU NEGATIVE 11/29/2018 1659   HGBUR SMALL (A) 11/29/2018 1659   BILIRUBINUR MODERATE (A) 11/29/2018 1659   KETONESUR 20 (A) 11/29/2018 1659   PROTEINUR NEGATIVE 11/29/2018 1659   NITRITE NEGATIVE 11/29/2018 1659   LEUKOCYTESUR  NEGATIVE 11/29/2018 1659   Sepsis Labs: @LABRCNTIP (procalcitonin:4,lacticidven:4)  ) Recent Results (from the past 240 hour(s))  SARS Coronavirus 2 [facility] order, Performed in [facility] hospital lab) Nasopharyngeal Nasopharyngeal Swab     Status: None   Collection Time: 11/29/18  5:27 PM   Specimen: Nasopharyngeal Swab  Result Value Ref Range Status   SARS Coronavirus 2 NEGATIVE NEGATIVE Final    Comment: (NOTE) If result is NEGATIVE SARS-CoV-2 target nucleic acids are NOT DETECTED. The SARS-CoV-2 RNA is generally detectable in upper and lower  respiratory specimens during the acute phase of infection. The lowest  concentration of SARS-CoV-2 viral copies this assay can detect is 250  copies / mL. A negative result does not preclude SARS-CoV-2 infection  and should not be used as the sole basis for treatment or other  patient management decisions.  A negative result may occur with  improper specimen collection / handling, submission of specimen other  than nasopharyngeal swab, presence of viral mutation(s) within the  areas targeted by this assay, and inadequate number of viral copies  (<250 copies / mL). A negative result must be combined with clinical  observations, patient history, and epidemiological information. If result is POSITIVE SARS-CoV-2 target nucleic acids are DETECTED. The SARS-CoV-2 RNA is generally detectable in upper and lower  respiratory specimens dur ing the acute phase of infection.  Positive  results are indicative of active infection with SARS-CoV-2.  Clinical  correlation with patient history and other diagnostic information is  necessary to determine patient infection status.  Positive results do  not rule out bacterial infection or co-infection with other viruses. If result is PRESUMPTIVE POSTIVE SARS-CoV-2 nucleic acids MAY BE PRESENT.   A presumptive positive result was obtained on the submitted specimen  and confirmed on repeat testing.  While  2019 novel coronavirus  (SARS-CoV-2) nucleic acids may be present in the submitted sample  additional confirmatory testing may be necessary for epidemiological  and / or clinical management purposes  to differentiate between  SARS-CoV-2 and other Sarbecovirus currently known to infect humans.  If clinically indicated additional testing with an alternate test  methodology [phone]) is advised. The SARS-CoV-2 RNA is generally  detectable in upper and lower respiratory sp ecimens during the acute  phase of infection. The expected result is Negative. Fact Sheet for Patients:  BoilerBrush.com.cy Fact Sheet for Healthcare Providers: https://pope.com/ This test is not yet approved or cleared by the [place] FDA and has been authorized for detection and/or diagnosis of SARS-CoV-2 by FDA under an Emergency Use Authorization (EUA).  This EUA will remain in effect (meaning this test can be used) for the duration of the COVID-19 declaration under  Section 564(b)(1) of the Act, 21 U.S.C. section 360bbb-3(b)(1), unless the authorization is terminated or revoked sooner. Performed at [facility], [place] [address], [place], [place] [address]   Culture, blood (Routine X 2) w Reflex to ID Panel     Status: None (Preliminary result)   Collection Time: 11/30/18  3:25 AM   Specimen: BLOOD RIGHT HAND  Result Value Ref Range Status   Specimen Description BLOOD RIGHT HAND  Final   Special Requests   Final    BOTTLES DRAWN AEROBIC AND ANAEROBIC Blood Culture results may not be optimal due to an inadequate volume of blood received in culture bottles   Culture   Final    NO GROWTH 1 DAY Performed at [facility], [place] [address], [place], [place] [address]    Report Status PENDING  Incomplete  Culture, blood (Routine X 2) w Reflex to ID Panel     Status: None (Preliminary result)   Collection Time: 11/30/18  3:33 AM   Specimen: BLOOD LEFT HAND   Result Value Ref Range Status   Specimen Description BLOOD LEFT HAND  Final   Special Requests   Final    BOTTLES DRAWN AEROBIC AND ANAEROBIC Blood Culture adequate volume   Culture   Final    NO GROWTH 1 DAY Performed at [facility], [place] [address], [place], [place] [address]    Report Status PENDING  Incomplete         Radiology Studies: Mr Abdomen Mrcp Wo Contrast  Result Date: 11/30/2018 CLINICAL DATA:  Abdominal pain. Nausea and vomiting. Jaundice. Acute pancreatitis with pseudocyst. EXAM: MRI ABDOMEN WITHOUT CONTRAST  (INCLUDING MRCP) TECHNIQUE: Multiplanar multisequence MR imaging of the abdomen was performed. Heavily T2-weighted images of the biliary and pancreatic ducts were obtained, and three-dimensional MRCP images were rendered by post processing. COMPARISON:  CT on 11/29/2018 FINDINGS: Lower chest: No acute findings. Hepatobiliary: No masses visualized on this unenhanced exam. Gallbladder is unremarkable. Mild diffuse biliary ductal dilatation is again seen, with common bile duct measuring approximately 8 mm. No evidence of choledocholithiasis. Smoothly tapered stricture of the distal common bile duct is seen adjacent to cystic lesion in the pancreatic head. Pancreas: Mild acute pancreatitis is stable in appearance since previous study. A well-circumscribed cystic lesion is again seen in the pancreatic head, measuring 4.5 x 4.1 cm, also stable. This is most consistent with a pseudocyst. Beaded appearance of the main pancreatic duct is noted, which is suspicious for chronic pancreatitis. Spleen:  Within normal limits in size. Adrenals/Urinary tract: Stable approximately 5 cm benign-appearing cyst in upper pole of right kidney. No evidence of hydronephrosis. Stomach/Bowel: Visualized portion unremarkable. Vascular/Lymphatic: No pathologically enlarged lymph nodes identified. No evidence of abdominal aortic aneurysm. Other:  None. Musculoskeletal:  No suspicious bone lesions  identified. IMPRESSION: 1. Stable mild acute pancreatitis, superimposed on changes of chronic pancreatitis. 2. Stable 4.5 cm cystic lesion in pancreatic head, most consistent with pseudocyst. 3. Stable mild biliary ductal dilatation, with smoothly tapered stricture in the distal common bile duct adjacent to cystic pancreatic lesion. No evidence of choledocholithiasis. Electronically Signed   By: Marlaine Hind M.D.   On: 11/30/2018 16:47   Mr 3d Recon At Scanner  Result Date: 11/30/2018 CLINICAL DATA:  Abdominal pain. Nausea and vomiting. Jaundice. Acute pancreatitis with pseudocyst. EXAM: MRI ABDOMEN WITHOUT CONTRAST  (INCLUDING MRCP) TECHNIQUE: Multiplanar multisequence MR imaging of the abdomen was performed. Heavily T2-weighted images of the biliary and pancreatic ducts were obtained,  and three-dimensional MRCP images were rendered by post processing. COMPARISON:  CT on 11/29/2018 FINDINGS: Lower chest: No acute findings. Hepatobiliary: No masses visualized on this unenhanced exam. Gallbladder is unremarkable. Mild diffuse biliary ductal dilatation is again seen, with common bile duct measuring approximately 8 mm. No evidence of choledocholithiasis. Smoothly tapered stricture of the distal common bile duct is seen adjacent to cystic lesion in the pancreatic head. Pancreas: Mild acute pancreatitis is stable in appearance since previous study. A well-circumscribed cystic lesion is again seen in the pancreatic head, measuring 4.5 x 4.1 cm, also stable. This is most consistent with a pseudocyst. Beaded appearance of the main pancreatic duct is noted, which is suspicious for chronic pancreatitis. Spleen:  Within normal limits in size. Adrenals/Urinary tract: Stable approximately 5 cm benign-appearing cyst in upper pole of right kidney. No evidence of hydronephrosis. Stomach/Bowel: Visualized portion unremarkable. Vascular/Lymphatic: No pathologically enlarged lymph nodes identified. No evidence of abdominal aortic  aneurysm. Other:  None. Musculoskeletal:  No suspicious bone lesions identified. IMPRESSION: 1. Stable mild acute pancreatitis, superimposed on changes of chronic pancreatitis. 2. Stable 4.5 cm cystic lesion in pancreatic head, most consistent with pseudocyst. 3. Stable mild biliary ductal dilatation, with smoothly tapered stricture in the distal common bile duct adjacent to cystic pancreatic lesion. No evidence of choledocholithiasis. Electronically Signed   By: Danae OrleansJohn A Stahl M.D.   On: 11/30/2018 16:47   Dg Ercp Biliary & Pancreatic Ducts  Result Date: 12/01/2018 CLINICAL DATA:  58 year old male status post unsuccessful ERCP EXAM: ERCP TECHNIQUE: Multiple spot images obtained with the fluoroscopic device and submitted for interpretation post-procedure. FLUOROSCOPY TIME:  Fluoroscopy Time:  2 minutes 26 seconds COMPARISON:  None. FINDINGS: A single intra procedural saved image is submitted for review. The image demonstrates a flexible endoscope in the descending duodenum with wire cannulation of the pancreatic duct. IMPRESSION: Wire cannulation of the pancreatic duct. These images were submitted for radiologic interpretation only. Please see the procedural report for the amount of contrast and the fluoroscopy time utilized. Electronically Signed   By: Malachy MoanHeath  McCullough M.D.   On: 12/01/2018 19:46        Scheduled Meds:  amLODipine  5 mg Oral Daily   calcium carbonate  1 tablet Oral Daily   multivitamin with minerals  1 tablet Oral Daily   nicotine  21 mg Transdermal Daily   omega-3 acid ethyl esters  1 g Oral Daily   pantoprazole (PROTONIX) IV  40 mg Intravenous Q12H   vitamin C  500 mg Oral Daily   Continuous Infusions:  sodium chloride 125 mL/hr at 12/02/18 0020   piperacillin-tazobactam (ZOSYN)  IV 3.375 g (12/02/18 0452)     LOS: 3 days   The patient is critically ill with multiple organ systems failure and requires high complexity decision making for assessment and support,  frequent evaluation and titration of therapies, application of advanced monitoring technologies and extensive interpretation of multiple databases. Critical Care Time devoted to patient care services described in this note  Time spent: 40 minutes     Kimyatta Lecy, Roselind MessierURTIS J, MD [facility] Pager [phone]  If 7PM-7AM, please contact night-coverage [email or web address] Password [email or web address] 12/02/2018, 8:58 AM

## 2018-12-02 NOTE — Anesthesia Postprocedure Evaluation (Signed)
Anesthesia Post Note  Patient: Philip Rhodes  Procedure(s) Performed: ENDOSCOPIC RETROGRADE CHOLANGIOPANCREATOGRAPHY (ERCP) (N/A ) PANCREATIC STENT PLACEMENT STENT REMOVAL     Patient location during evaluation: PACU Anesthesia Type: General Level of consciousness: awake and alert Pain management: pain level controlled Vital Signs Assessment: post-procedure vital signs reviewed and stable Respiratory status: spontaneous breathing, nonlabored ventilation, respiratory function stable and patient connected to nasal cannula oxygen Cardiovascular status: blood pressure returned to baseline and stable Postop Assessment: no apparent nausea or vomiting Anesthetic complications: no    Last Vitals:  Vitals:   12/01/18 2106 12/02/18 0455  BP: (!) 148/79 122/71  Pulse: 64 (!) 51  Resp: 18 19  Temp: 36.8 C 36.6 C  SpO2: 99% 99%    Last Pain:  Vitals:   12/02/18 0455  TempSrc: Oral  PainSc:                  [name]

## 2018-12-02 NOTE — Progress Notes (Signed)
RT NOTE:   Patient stated that he would place CPAP on himself.  RT filled water chamber for patient and advised to call if he need assistance.

## 2018-12-02 NOTE — Progress Notes (Signed)
Pharmacy Antibiotic Note  Philip Rhodes is a 58 y.o. male admitted on 11/29/2018 with [place] infection. Recently hospitalized from 8/27-8/21 due to acute pancreatitis. Afebrile, WBC 11.3, Scr 0.63. Pharmacy has been consulted for zosyn dosing.  Plan: Continue Zosyn 3.375 g IV q8h Monitor renal function Monitor C/s   Temp (24hrs), Avg:98 F (36.7 C), Min:97.7 F (36.5 C), Max:98.5 F (36.9 C)  Recent Labs  Lab 11/29/18 1514 11/30/18 0329 12/01/18 0126 12/01/18 0223 12/02/18 0105  WBC 14.2* 12.8* 11.5*  --  11.3*  CREATININE 0.51* 0.56* 0.55*  --  0.63  LATICACIDVEN  --   --   --  0.6  --     Estimated Creatinine Clearance: 132.3 mL/min (by C-G formula based on SCr of 0.63 mg/dL).    No Known Allergies  Antimicrobials this admission: Zosyn 9/14 >>   Microbiology results: 9/14 COVID: negative   Philip Rhodes A. Levada Dy, PharmD, BCPS, FNKF Clinical Pharmacist [place] Please utilize Amion for appropriate phone number to reach the unit pharmacist ([place])

## 2018-12-02 NOTE — Progress Notes (Signed)
[facility] PROGRESS NOTE Subjective Patient had a ERCP to decompress the bile duct yesterday by Dr. Marca Ancona.  Unfortunately, she was unable to get into the bile duct to place a stent and did place a pancreatic stent.  He is having no pain overnight somewhat hungry  Objective: Vital signs in last 24 hours: Temp:  [97.7 F (36.5 C)-98.5 F (36.9 C)] 97.8 F (36.6 C) (09/17 0455) Pulse Rate:  [51-83] 51 (09/17 0455) Resp:  [15-21] 19 (09/17 0455) BP: (122-172)/(65-79) 122/71 (09/17 0455) SpO2:  [96 %-100 %] 99 % (09/17 0455) Last BM Date: 11/28/18  Intake/Output from previous day: 09/16 0701 - 09/17 0700 In: 1751.4 [I.V.:1751.4] Out: -  Intake/Output this shift: Total I/O In: 401.4 [I.V.:401.4] Out: -   PE: General--patient in no distress has CPAP waking up  Abdomen--nondistended, obese, nontender, good bowel sounds  Lab Results: Recent Labs    11/29/18 1514 11/30/18 0329 12/01/18 0126 12/02/18 0105  WBC 14.2* 12.8* 11.5* 11.3*  HGB 15.8 14.8 13.9 12.8*  HCT 44.0 42.7 39.2 38.1*  PLT 406* 401* 363 360   BMET Recent Labs    11/29/18 1514 11/30/18 0329 12/01/18 0126 12/02/18 0105  NA 130* 135 135 135  K 3.7 4.0 3.5 3.9  CL 97* 103 104 107  CO2 21* 20* 19* 17*  CREATININE 0.51* 0.56* 0.55* 0.63   LFT Recent Labs    11/30/18 0329 12/01/18 0126 12/02/18 0105  PROT 6.9 6.4* 5.9*  AST 112* 85* 72*  ALT 213* 171* 131*  ALKPHOS 218* 231* 213*  BILITOT 11.5* 12.2* 12.1*  BILIDIR 7.3*  --   --    PT/INR Recent Labs    11/29/18 1831 11/30/18 0329  LABPROT 14.2 14.3  INR 1.1 1.1   PANCREAS Recent Labs    11/29/18 1514 12/01/18 0126  LIPASE 288* 211*         Studies/Results: Mr Abdomen Mrcp Wo Contrast  Result Date: 11/30/2018 CLINICAL DATA:  Abdominal pain. Nausea and vomiting. Jaundice. Acute pancreatitis with pseudocyst. EXAM: MRI ABDOMEN WITHOUT CONTRAST  (INCLUDING MRCP) TECHNIQUE: Multiplanar multisequence MR imaging of the  abdomen was performed. Heavily T2-weighted images of the biliary and pancreatic ducts were obtained, and three-dimensional MRCP images were rendered by post processing. COMPARISON:  CT on 11/29/2018 FINDINGS: Lower chest: No acute findings. Hepatobiliary: No masses visualized on this unenhanced exam. Gallbladder is unremarkable. Mild diffuse biliary ductal dilatation is again seen, with common bile duct measuring approximately 8 mm. No evidence of choledocholithiasis. Smoothly tapered stricture of the distal common bile duct is seen adjacent to cystic lesion in the pancreatic head. Pancreas: Mild acute pancreatitis is stable in appearance since previous study. A well-circumscribed cystic lesion is again seen in the pancreatic head, measuring 4.5 x 4.1 cm, also stable. This is most consistent with a pseudocyst. Beaded appearance of the main pancreatic duct is noted, which is suspicious for chronic pancreatitis. Spleen:  Within normal limits in size. Adrenals/Urinary tract: Stable approximately 5 cm benign-appearing cyst in upper pole of right kidney. No evidence of hydronephrosis. Stomach/Bowel: Visualized portion unremarkable. Vascular/Lymphatic: No pathologically enlarged lymph nodes identified. No evidence of abdominal aortic aneurysm. Other:  None. Musculoskeletal:  No suspicious bone lesions identified. IMPRESSION: 1. Stable mild acute pancreatitis, superimposed on changes of chronic pancreatitis. 2. Stable 4.5 cm cystic lesion in pancreatic head, most consistent with pseudocyst. 3. Stable mild biliary ductal dilatation, with smoothly tapered stricture in the distal common bile duct adjacent to cystic pancreatic lesion. No evidence of choledocholithiasis.  Electronically Signed   By: Marlaine Hind M.D.   On: 11/30/2018 16:47   Mr 3d Recon At Scanner  Result Date: 11/30/2018 CLINICAL DATA:  Abdominal pain. Nausea and vomiting. Jaundice. Acute pancreatitis with pseudocyst. EXAM: MRI ABDOMEN WITHOUT CONTRAST   (INCLUDING MRCP) TECHNIQUE: Multiplanar multisequence MR imaging of the abdomen was performed. Heavily T2-weighted images of the biliary and pancreatic ducts were obtained, and three-dimensional MRCP images were rendered by post processing. COMPARISON:  CT on 11/29/2018 FINDINGS: Lower chest: No acute findings. Hepatobiliary: No masses visualized on this unenhanced exam. Gallbladder is unremarkable. Mild diffuse biliary ductal dilatation is again seen, with common bile duct measuring approximately 8 mm. No evidence of choledocholithiasis. Smoothly tapered stricture of the distal common bile duct is seen adjacent to cystic lesion in the pancreatic head. Pancreas: Mild acute pancreatitis is stable in appearance since previous study. A well-circumscribed cystic lesion is again seen in the pancreatic head, measuring 4.5 x 4.1 cm, also stable. This is most consistent with a pseudocyst. Beaded appearance of the main pancreatic duct is noted, which is suspicious for chronic pancreatitis. Spleen:  Within normal limits in size. Adrenals/Urinary tract: Stable approximately 5 cm benign-appearing cyst in upper pole of right kidney. No evidence of hydronephrosis. Stomach/Bowel: Visualized portion unremarkable. Vascular/Lymphatic: No pathologically enlarged lymph nodes identified. No evidence of abdominal aortic aneurysm. Other:  None. Musculoskeletal:  No suspicious bone lesions identified. IMPRESSION: 1. Stable mild acute pancreatitis, superimposed on changes of chronic pancreatitis. 2. Stable 4.5 cm cystic lesion in pancreatic head, most consistent with pseudocyst. 3. Stable mild biliary ductal dilatation, with smoothly tapered stricture in the distal common bile duct adjacent to cystic pancreatic lesion. No evidence of choledocholithiasis. Electronically Signed   By: Marlaine Hind M.D.   On: 11/30/2018 16:47   Dg Ercp Biliary & Pancreatic Ducts  Result Date: 12/01/2018 CLINICAL DATA:  58 year old male status post  unsuccessful ERCP EXAM: ERCP TECHNIQUE: Multiple spot images obtained with the fluoroscopic device and submitted for interpretation post-procedure. FLUOROSCOPY TIME:  Fluoroscopy Time:  2 minutes 26 seconds COMPARISON:  None. FINDINGS: A single intra procedural saved image is submitted for review. The image demonstrates a flexible endoscope in the descending duodenum with wire cannulation of the pancreatic duct. IMPRESSION: Wire cannulation of the pancreatic duct. These images were submitted for radiologic interpretation only. Please see the procedural report for the amount of contrast and the fluoroscopy time utilized. Electronically Signed   By: Jacqulynn Cadet M.D.   On: 12/01/2018 19:46    Medications: I have reviewed the patient's current medications.  Assessment:   1.  Pancreatitis with pseudocyst 2.  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  He does have a stent in the pancreatic duct and hopefully this will help decompress the pseudocyst enough to release the CBD.  He is completely asymptomatic and I think to take ice chips.   Plan: We will continue to follow his labs and we will go ahead and allow ice chips.  He may or may not need another ERCP in the next few days   Nancy Fetter 12/02/2018, 6:44 AM  This note was created using voice recognition software. Minor errors may Have occurred unintentionally.  Pager: [phone] If no answer or after hours call [phone]

## 2018-12-02 NOTE — Anesthesia Preprocedure Evaluation (Addendum)
Anesthesia Evaluation  Patient identified by MRN, date of birth, ID band Patient awake    Reviewed: Allergy & Precautions, NPO status , Patient's Chart, lab work & pertinent test results  Airway Mallampati: II  TM Distance: >3 FB Neck ROM: Full    Dental no notable dental hx. (+) Teeth Intact   Pulmonary sleep apnea and Continuous Positive Airway Pressure Ventilation , Current Smoker and Patient abstained from smoking.,    Pulmonary exam normal breath sounds clear to auscultation       Cardiovascular Exercise Tolerance: Good hypertension, Pt. on medications Normal cardiovascular exam Rhythm:Regular Rate:Normal     Neuro/Psych negative neurological ROS  negative psych ROS   GI/Hepatic negative GI ROS, Neg liver ROS,   Endo/Other  negative endocrine ROS  Renal/GU negative Renal ROS     Musculoskeletal   Abdominal   Peds  Hematology negative hematology ROS (+)   Anesthesia Other Findings   Reproductive/Obstetrics                                                            Anesthesia Evaluation  Patient identified by MRN, date of birth, ID band Patient awake    Reviewed: Allergy & Precautions, H&P , NPO status , Patient's Chart, lab work & pertinent test results, reviewed documented beta blocker date and time   Airway Mallampati: III  TM Distance: >3 FB Neck ROM: full    Dental no notable dental hx. (+) Caps, Teeth Intact   Pulmonary sleep apnea and Continuous Positive Airway Pressure Ventilation , Current Smoker,    Pulmonary exam normal breath sounds clear to auscultation       Cardiovascular Exercise Tolerance: Good hypertension, Pt. on medications negative cardio ROS   Rhythm:regular Rate:Normal     Neuro/Psych negative neurological ROS  negative psych ROS   GI/Hepatic negative GI ROS, Neg liver ROS,   Endo/Other  negative endocrine ROS  Renal/GU negative  Renal ROS  negative genitourinary   Musculoskeletal   Abdominal   Peds  Hematology negative hematology ROS (+)   Anesthesia Other Findings   Reproductive/Obstetrics negative OB ROS                             Anesthesia Physical Anesthesia Plan  ASA: III and emergent  Anesthesia Plan: General   Post-op Pain Management:    Induction:   PONV Risk Score and Plan: 2 and Ondansetron and Treatment may vary due to age or medical condition  Airway Management Planned: LMA and Oral ETT  Additional Equipment:   Intra-op Plan:   Post-operative Plan:   Informed Consent: I have reviewed the patients History and Physical, chart, labs and discussed the procedure including the risks, benefits and alternatives for the proposed anesthesia with the patient or authorized representative who has indicated his/her understanding and acceptance.     Dental Advisory Given  Plan Discussed with: CRNA, Anesthesiologist and Surgeon  Anesthesia Plan Comments:         Anesthesia Quick Evaluation  Anesthesia Physical Anesthesia Plan  ASA: III  Anesthesia Plan: General   Post-op Pain Management:    Induction: Intravenous  PONV Risk Score and Plan: 3 and Treatment may vary due to age or medical condition, Ondansetron, Dexamethasone, Midazolam and TIVA  Airway Management Planned: Oral ETT  Additional Equipment:   Intra-op Plan:   Post-operative Plan: Extubation in OR  Informed Consent: I have reviewed the patients History and Physical, chart, labs and discussed the procedure including the risks, benefits and alternatives for the proposed anesthesia with the patient or authorized representative who has indicated his/her understanding and acceptance.     Dental advisory given  Plan Discussed with:   Anesthesia Plan Comments:         Anesthesia Quick Evaluation

## 2018-12-03 ENCOUNTER — Encounter (HOSPITAL_COMMUNITY): Payer: Self-pay

## 2018-12-03 ENCOUNTER — Inpatient Hospital Stay (HOSPITAL_COMMUNITY): Payer: 59

## 2018-12-03 ENCOUNTER — Encounter (HOSPITAL_COMMUNITY)
Admission: EM | Disposition: A | Discharge status: Home / Self Care | Payer: Self-pay | Source: Home / Self Care | Attending: Internal Medicine

## 2018-12-03 ENCOUNTER — Inpatient Hospital Stay (HOSPITAL_COMMUNITY): Payer: 59 | Admitting: Anesthesiology

## 2018-12-03 DIAGNOSIS — R17 Unspecified jaundice: Secondary | ICD-10-CM

## 2018-12-03 DIAGNOSIS — K851 Biliary acute pancreatitis without necrosis or infection: Secondary | ICD-10-CM

## 2018-12-03 HISTORY — PX: SPHINCTEROTOMY: SHX5544

## 2018-12-03 HISTORY — PX: ERCP: SHX5425

## 2018-12-03 HISTORY — PX: BILIARY STENT PLACEMENT: SHX5538

## 2018-12-03 LAB — COMPREHENSIVE METABOLIC PANEL
ALT: 104 U/L — ABNORMAL HIGH (ref 0–44)
AST: 86 U/L — ABNORMAL HIGH (ref 15–41)
Albumin: 2.3 g/dL — ABNORMAL LOW (ref 3.5–5.0)
Alkaline Phosphatase: 242 U/L — ABNORMAL HIGH (ref 38–126)
Anion gap: 10 (ref 5–15)
BUN: <5 mg/dL — ABNORMAL LOW (ref 6–20)
CO2: 17 mmol/L — ABNORMAL LOW (ref 22–32)
Calcium: 8 mg/dL — ABNORMAL LOW (ref 8.9–10.3)
Chloride: 108 mmol/L (ref 98–111)
Creatinine, Ser: 0.62 mg/dL (ref 0.61–1.24)
GFR calc Af Amer: >60 mL/min (ref >60)
GFR calc non Af Amer: >60 mL/min (ref >60)
Glucose, Bld: 72 mg/dL (ref 70–99)
Potassium: 4.9 mmol/L (ref 3.5–5.1)
Sodium: 135 mmol/L (ref 135–145)
Total Bilirubin: 13.5 mg/dL — ABNORMAL HIGH (ref 0.3–1.2)
Total Protein: 5.6 g/dL — ABNORMAL LOW (ref 6.5–8.1)

## 2018-12-03 LAB — CBC WITH DIFFERENTIAL/PLATELET
Abs Immature Granulocytes: 0.05 10*3/uL (ref 0.00–0.07)
Basophils Absolute: 0.1 10*3/uL (ref 0.0–0.1)
Basophils Relative: 1 %
Eosinophils Absolute: 1 10*3/uL — ABNORMAL HIGH (ref 0.0–0.5)
Eosinophils Relative: 11 %
HCT: 36.5 % — ABNORMAL LOW (ref 39.0–52.0)
Hemoglobin: 12.2 g/dL — ABNORMAL LOW (ref 13.0–17.0)
Immature Granulocytes: 1 %
Lymphocytes Relative: 27 %
Lymphs Abs: 2.5 10*3/uL (ref 0.7–4.0)
MCH: 31.8 pg (ref 26.0–34.0)
MCHC: 33.4 g/dL (ref 30.0–36.0)
MCV: 95.1 fL (ref 80.0–100.0)
Monocytes Absolute: 0.8 10*3/uL (ref 0.1–1.0)
Monocytes Relative: 9 %
Neutro Abs: 4.7 10*3/uL (ref 1.7–7.7)
Neutrophils Relative %: 51 %
Platelets: 369 10*3/uL (ref 150–400)
RBC: 3.84 MIL/uL — ABNORMAL LOW (ref 4.22–5.81)
RDW: 14.5 % (ref 11.5–15.5)
WBC: 9.2 10*3/uL (ref 4.0–10.5)
nRBC: 0 % (ref 0.0–0.2)

## 2018-12-03 LAB — LIPASE, BLOOD: Lipase: 84 U/L — ABNORMAL HIGH (ref 11–51)

## 2018-12-03 LAB — GLUCOSE, CAPILLARY: Glucose-Capillary: 89 mg/dL (ref 70–99)

## 2018-12-03 LAB — MAGNESIUM: Magnesium: 1.9 mg/dL (ref 1.7–2.4)

## 2018-12-03 LAB — PHOSPHORUS: Phosphorus: 3.3 mg/dL (ref 2.5–4.6)

## 2018-12-03 IMAGING — RF DG ERCP WO/W SPHINCTEROTOMY
1 series · 3 of 3 positions shown · non-contrast
Comparison: None.

CLINICAL DATA: Pancreatitis.  Abnormal LFTs.

EXAM:
ERCP
TECHNIQUE: Multiple spot images obtained with the fluoroscopic device and
submitted for interpretation post-procedure.
FLUOROSCOPY TIME:  52 seconds

[Series 1: unknown protocol · 0.20mm/px · 3 of 3 slices shown]
[im 1/3]
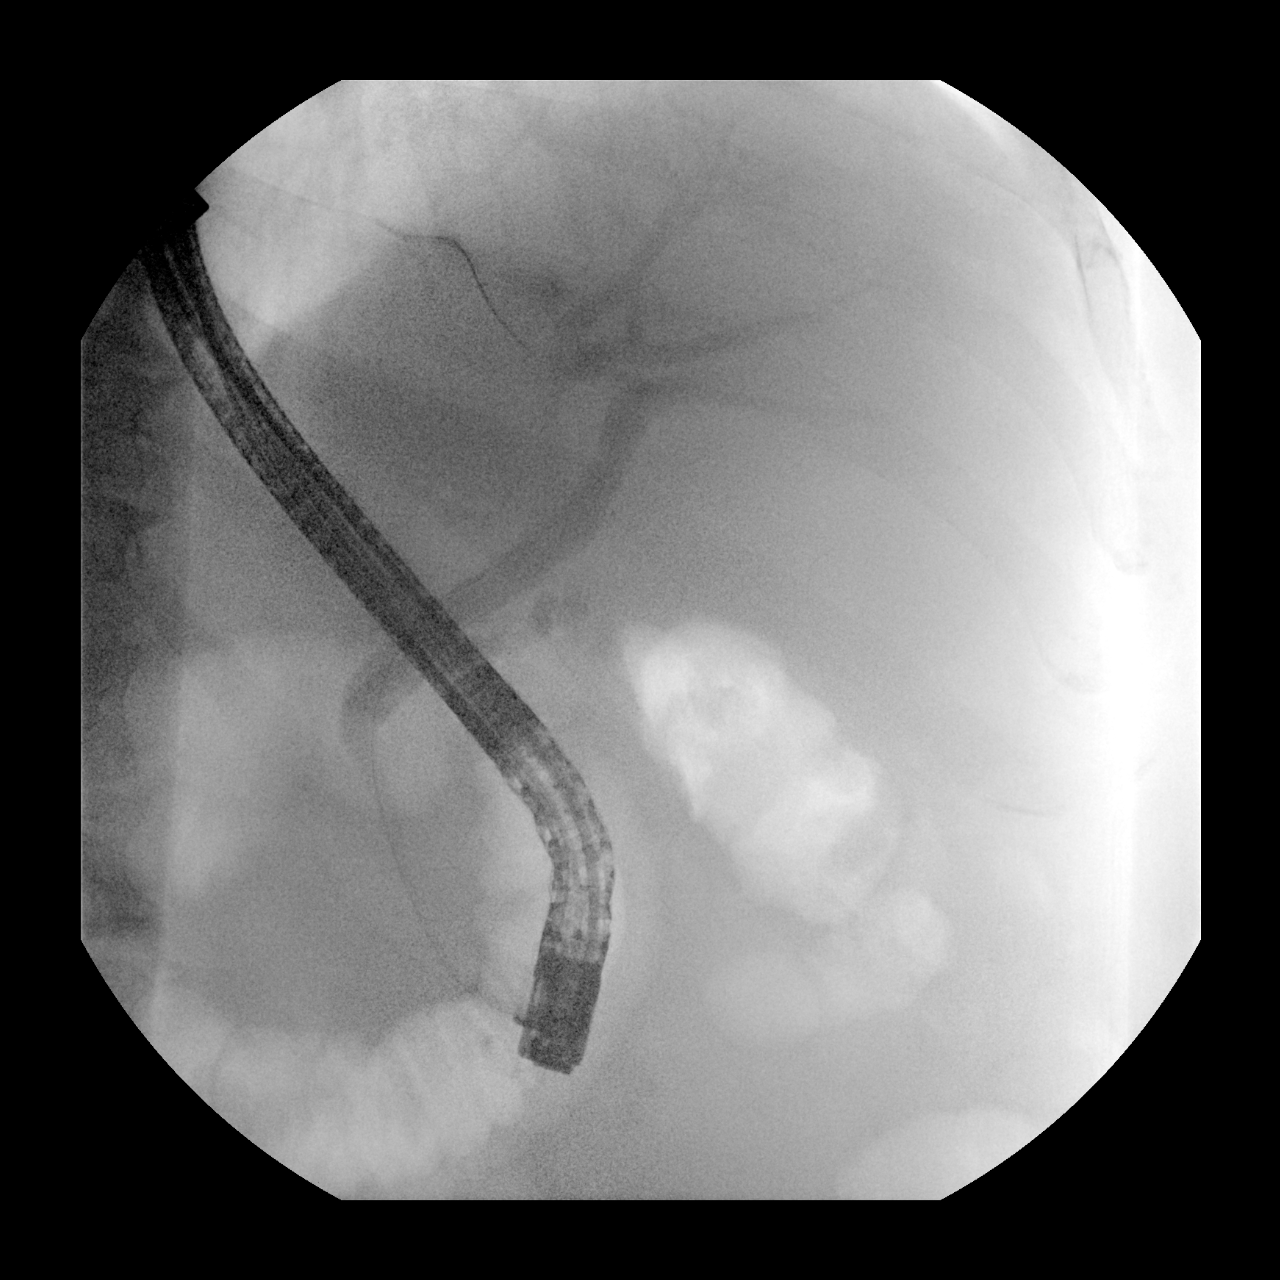
[im 2/3]
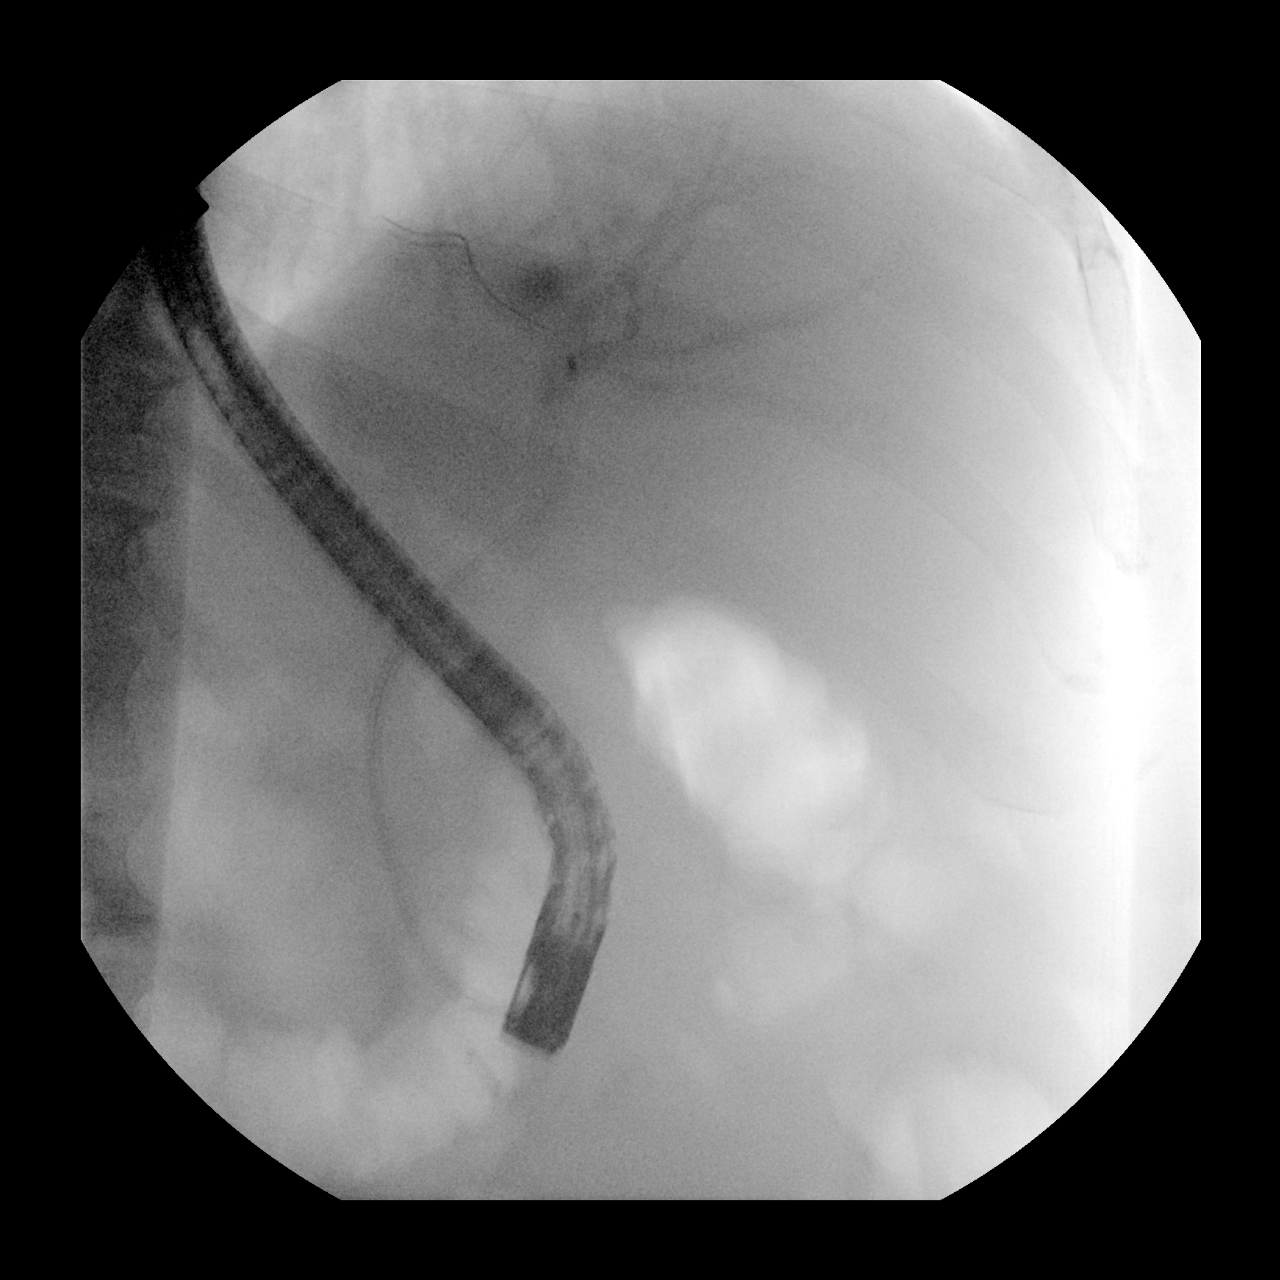
[im 3/3]
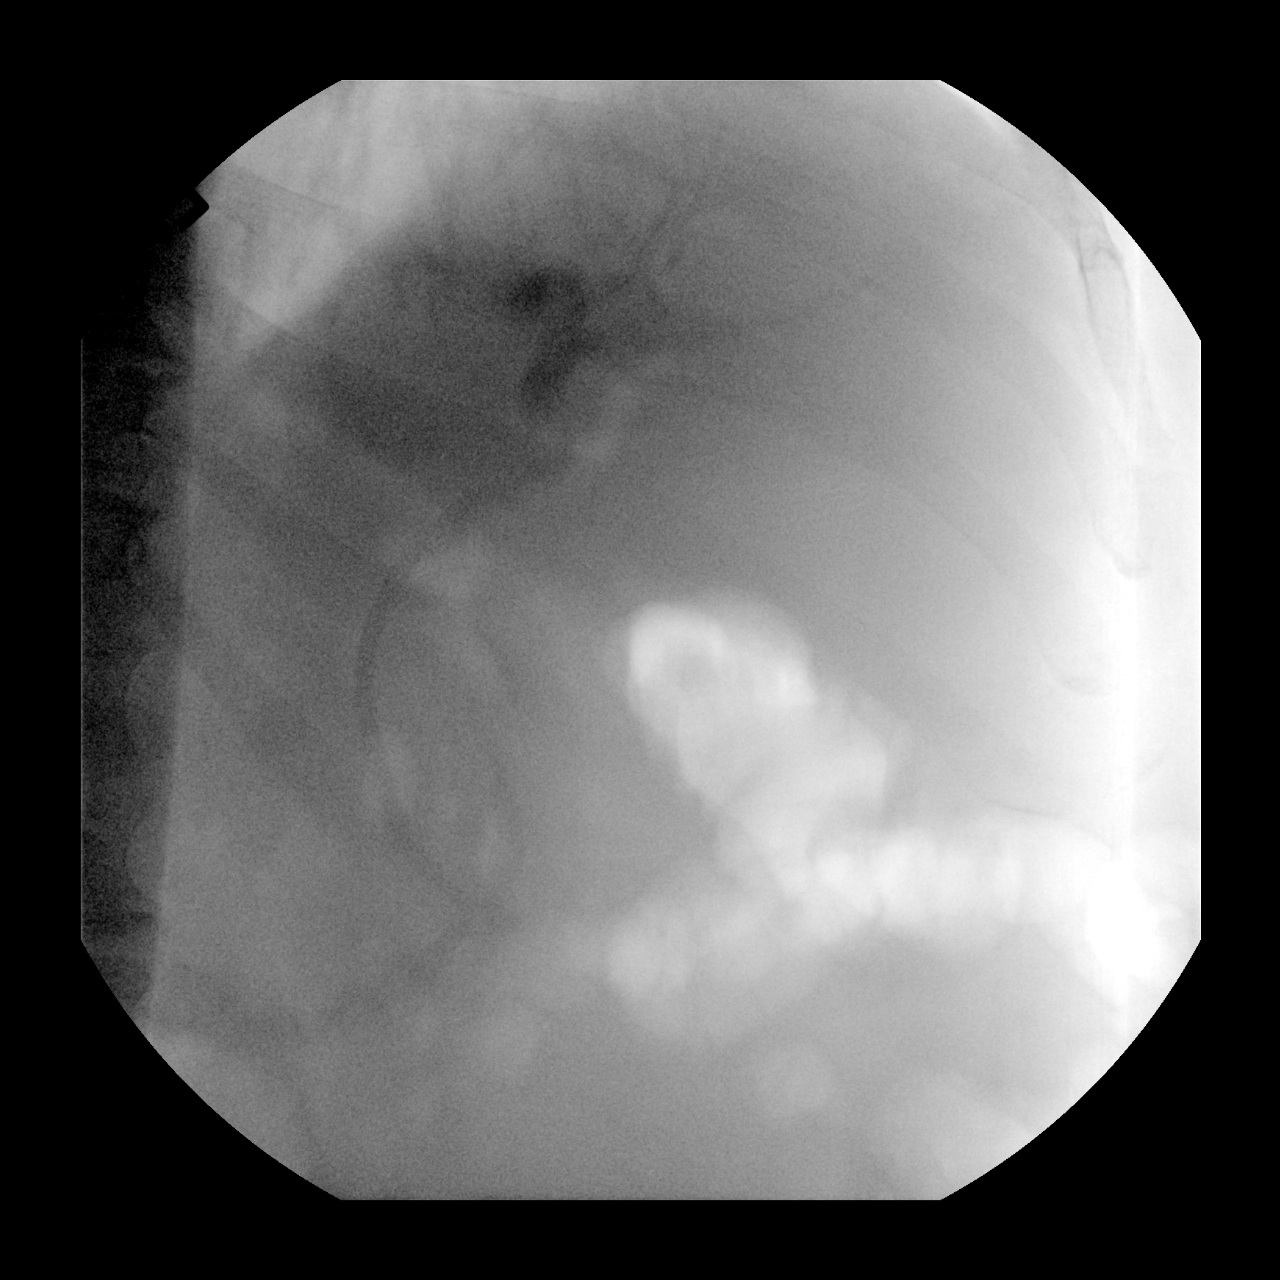

[3 of 3 positions shown; findings below may reference images not displayed]

FINDINGS: Three spot fluoroscopic images of the right upper abdominal quadrant
during ERCP are provided for review

Initial image demonstrates an ERCP probe overlying the right upper
abdominal quadrant. There is selective cannulation opacification of
the mid and central aspects of the CBD and minimal opacification of
the central aspect of the intrahepatic biliary tree all of which
appear mildly dilated. There is minimal opacification the cystic
duct.

No discrete persistent intraluminal filling defects are seen suggest
the presence of choledocholithiasis.

Completion image demonstrates placement of an internal biliary stent
overlying the expected location of the CBD.
IMPRESSION: ERCP with biliary stent placement.

These images were submitted for radiologic interpretation only.
Please see the procedural report for the amount of contrast and the
fluoroscopy time utilized.

## 2018-12-03 SURGERY — ERCP, WITH INTERVENTION IF INDICATED
Anesthesia: General

## 2018-12-03 MED ORDER — MIDAZOLAM HCL (PF) 5 MG/ML IJ SOLN
INTRAMUSCULAR | Status: AC
  Filled 2018-12-03: qty 1

## 2018-12-03 MED ORDER — ONDANSETRON HCL 4 MG/2ML IJ SOLN
INTRAMUSCULAR | Status: DC | PRN
  Administered 2018-12-03: 4 mg via INTRAVENOUS

## 2018-12-03 MED ORDER — GLUCAGON HCL RDNA (DIAGNOSTIC) 1 MG IJ SOLR
INTRAMUSCULAR | Status: AC
  Filled 2018-12-03: qty 1

## 2018-12-03 MED ORDER — SODIUM CHLORIDE 0.9 % IV SOLN: INTRAVENOUS | Status: DC

## 2018-12-03 MED ORDER — MIDAZOLAM HCL 2 MG/2ML IJ SOLN
INTRAMUSCULAR | Status: DC | PRN
  Administered 2018-12-03: 2.5 mg via INTRAVENOUS

## 2018-12-03 MED ORDER — FENTANYL CITRATE (PF) 250 MCG/5ML IJ SOLN
INTRAMUSCULAR | Status: DC | PRN
  Administered 2018-12-03: 100 ug via INTRAVENOUS

## 2018-12-03 MED ORDER — INDOMETHACIN 50 MG RE SUPP
RECTAL | Status: DC | PRN
  Administered 2018-12-03: 100 mg via RECTAL

## 2018-12-03 MED ORDER — LIDOCAINE 2% (20 MG/ML) 5 ML SYRINGE
INTRAMUSCULAR | Status: DC | PRN
  Administered 2018-12-03: 100 mg via INTRAVENOUS

## 2018-12-03 MED ORDER — INDOMETHACIN 50 MG RE SUPP
RECTAL | Status: AC
  Filled 2018-12-03: qty 2

## 2018-12-03 MED ORDER — ROCURONIUM BROMIDE 10 MG/ML (PF) SYRINGE
PREFILLED_SYRINGE | INTRAVENOUS | Status: DC | PRN
  Administered 2018-12-03: 40 mg via INTRAVENOUS

## 2018-12-03 MED ORDER — FENTANYL CITRATE (PF) 100 MCG/2ML IJ SOLN
INTRAMUSCULAR | Status: AC
  Filled 2018-12-03: qty 2

## 2018-12-03 MED ORDER — SODIUM CHLORIDE 0.9 % IV SOLN
INTRAVENOUS | Status: DC | PRN
  Administered 2018-12-03: 13:00:00 10 mL

## 2018-12-03 MED ORDER — SUGAMMADEX SODIUM 200 MG/2ML IV SOLN
INTRAVENOUS | Status: DC | PRN
  Administered 2018-12-03: 245.8 mg via INTRAVENOUS

## 2018-12-03 MED ORDER — PROPOFOL 10 MG/ML IV BOLUS
INTRAVENOUS | Status: DC | PRN
  Administered 2018-12-03: 140 mg via INTRAVENOUS

## 2018-12-03 MED ORDER — DEXAMETHASONE SODIUM PHOSPHATE 10 MG/ML IJ SOLN
INTRAMUSCULAR | Status: DC | PRN
  Administered 2018-12-03: 5 mg via INTRAVENOUS

## 2018-12-03 MED ORDER — LACTATED RINGERS IV SOLN
INTRAVENOUS | Status: DC | PRN
  Administered 2018-12-03: 12:00:00 via INTRAVENOUS

## 2018-12-03 MED ORDER — SUCCINYLCHOLINE CHLORIDE 200 MG/10ML IV SOSY
PREFILLED_SYRINGE | INTRAVENOUS | Status: DC | PRN
  Administered 2018-12-03: 100 mg via INTRAVENOUS

## 2018-12-03 NOTE — Anesthesia Procedure Notes (Signed)
Procedure Name: Intubation Date/Time: 12/03/2018 12:53 PM Performed by: Kathryne Hitch, CRNA Pre-anesthesia Checklist: Patient identified, Emergency Drugs available, Suction available and Patient being monitored Patient Re-evaluated:Patient Re-evaluated prior to induction Oxygen Delivery Method: Circle system utilized Preoxygenation: Pre-oxygenation with 100% oxygen Induction Type: IV induction Ventilation: Mask ventilation without difficulty and Oral airway inserted - appropriate to patient size Laryngoscope Size: McGraph and 2 Grade View: Grade I Tube type: Oral Tube size: 7.5 mm Number of attempts: 1 Airway Equipment and Method: Stylet and Oral airway Placement Confirmation: ETT inserted through vocal cords under direct vision,  positive ETCO2 and breath sounds checked- equal and bilateral Secured at: 23 cm Tube secured with: Tape Dental Injury: Teeth and Oropharynx as per pre-operative assessment

## 2018-12-03 NOTE — Op Note (Signed)
[facility] Patient Name: Philip Rhodes Procedure Date : 12/03/2018 MRN: [id number] Attending MD: Vida Rigger , MD Date of Birth: May 16, 1960 CSN: [id number] Age: 58 Admit Type: Inpatient Procedure:                ERCP Indications:              Abnormal MRCP, Jaundice, Elevated liver enzymes,                            Pancreatic pseudocyst compressing CBD Providers:                Vida Rigger, MD, Margaree Mackintosh, RN,                            Kandice Robinsons, Technician, April Holding, RN Referring MD:              Medicines:                General Anesthesia Complications:            No immediate complications. Estimated Blood Loss:     Estimated blood loss: none. Procedure:                Pre-Anesthesia Assessment:                           - Prior to the procedure, a History and Physical                            was performed, and patient medications and                            allergies were reviewed. The patient's tolerance of                            previous anesthesia was also reviewed. The risks                            and benefits of the procedure and the sedation                            options and risks were discussed with the patient.                            All questions were answered, and informed consent                            was obtained. Prior Anticoagulants: The patient has                            taken Lovenox (enoxaparin), last dose was day of                            procedure. ASA Grade Assessment: II - A patient  with mild systemic disease. After reviewing the                            risks and benefits, the patient was deemed in                            satisfactory condition to undergo the procedure.                           After obtaining informed consent, the scope was                            passed under direct vision. Throughout the                            procedure,  the patient's blood pressure, pulse, and                            oxygen saturations were monitored continuously. The                            [id number]-Q180V ([id number] Olympus duodenoscope was                            introduced through the mouth, and used to inject                            contrast into and used to locate the major papilla.                            To advance into the duodenum did require raising                            his right side briefly and the ERCP was                            accomplished without difficulty. The patient                            tolerated the procedure well. Scope In: Scope Out: Findings:      One plastic pancreatic stent originating in the pancreatic duct was       emerging from the major papilla. The major papilla was edematous. On the       first attempt we advanced the wire which was the 0.025 wire with the       smaller sphincterotome towards the pancreas and the sphincterotome was       repositioned and deep selective cannulation was then readily obtained       and an obvious distal stricture was seen on initial cholangiogram and we       proceeded with a biliary sphincterotomy was made with a Hydratome       sphincterotome using ERBE electrocautery. There was no       post-sphincterotomy bleeding. This was done in the customary fashion and  the lower third of the main bile duct contained a single localized       stenosis 25 mm in length. One 10 Fr by 7 cm plastic stent with a single       external flap and a single internal flap was placed 6 cm into the common       bile duct. Bile flowed through the stent. The stent was in good       position. The procedure was terminated at this point the patient       tolerated the procedure well Impression:               - One stent from the pancreatic duct was seen in                            the major papilla.                           - The major papilla appeared edematous.                            - A single localized biliary stricture was found in                            the lower third of the main bile duct. The                            stricture was Probably from extrinsic compression                            from the pseudocyst. This stricture was treated                            with biliary sphincterotomy.                           - A sphincterotomy was performed.                           - One plastic stent was placed into the common bile                            duct. Recommendation:           - Clear liquid diet for 6 hours. May slowly advance                            to low-fat as tolerated                           - Continue present medications. Will follow liver                            tests back towards normal                           - Return to GI clinic in 2 weeks I  believe he                            already has an appointment with Dr. Levora AngelBrahmbhatt. He                            will need a follow-up KUB in 1 to 2 weeks to                            confirm pancreatic duct stent passes on its own                           - Telephone GI clinic if symptomatic PRN. If cyst                            does not resolve in 1 month might consider                            endoscopic drainage with EUS and could remove                            pancreatic stent at that time if still present and                            CBD stent to be removed when cyst resolves Procedure Code(s):        --- Professional ---                           [id number], Esophagogastroduodenoscopy, flexible,                            transoral; diagnostic, including collection of                            specimen(s) by brushing or washing, when performed                            (separate procedure) Diagnosis Code(s):        --- Professional ---                           U04.54Z96.89, Presence of other specified functional                            implants                            R17, Unspecified jaundice                           R74.8, Abnormal levels of other serum enzymes                           K86.3, Pseudocyst of pancreas  K83.8, Other specified diseases of biliary tract                           R93.2, Abnormal findings on diagnostic imaging of                            liver and biliary tract CPT copyright 2019 [employer]. All rights reserved. The codes documented in this report are preliminary and upon coder review may  be revised to meet current compliance requirements. Vida RiggerMarc Raza Bayless, MD 12/03/2018 1:23:07 PM This report has been signed electronically. Number of Addenda: 0

## 2018-12-03 NOTE — Progress Notes (Signed)
PROGRESS NOTE    Philip Rhodes  ZOX:[id number] DOB: 1960/12/16 DOA: 11/29/2018 PCP: Elias Else, MD   Brief Narrative:  58 y.o. WM PMHx hypertension, hyperlipidemia, OSA on CPAP, tobacco abuse, alcohol abuse (quit 4 weeks ago), who presents with abnormal lab.  Patient was recently hospitalized from 8/27-8/21 due to acute pancreatitis. GI was consulted and etiology was not clear. Pt had MRI of the abdomen with and without contrast/MRCP which showed acute pancreatitis and complex nonenhancing cystic lesion in the pancreatic head measures 4.4 x 3.7 x 4.0 cm, and demonstrates eccentric nodular hemorrhagic/proteinaceous debris, most compatible with a pancreatic pseudocyst, stable in size since recent 11/11/2018. Pt also had CT on 8/27 scan which showed no gallstones, gallbladder wall thickening, or biliary dilatation. Pt was discharged in stable condition.  His Bp med, Prinzide was hold due to pancreatitis.  Pt states that he had follow up visit with his primary physician and had lab done today, which showed elevated bilirubin up to 10 and abnormal liver function. He therefore was sent to ED for further evaluation treatment.  Patient states that he had intermittent mild nausea and minimal abdominal discomfort, which has resolved currently.  Currently patient does not have nausea, vomiting or diarrhea.  Denies fever or chills.  Patient does not have chest pain, shortness breath, cough, symptoms of UTI or unilateral weakness.  Patient used to be a heavy drinker, but he quit drinking alcohol 4 weeks ago.   Subjective: 9/18 A/O x4, negative CP, negative S OB, negative abdominal pain, negative N/V   Assessment & Plan:   Principal Problem:   Elevated LFTs Active Problems:   Essential hypertension   Pancreatitis   Hyponatremia   SIRS (systemic inflammatory response syndrome) (HCC)   Hyperbilirubinemia   Tobacco abuse   History of alcohol abuse   Elevated liver enzymes and  hyperbilirubinemia -MRCP concerning for narrowing at CBD -9/16 ERCP; unable to place stent in common bile duct but placed a pancreatic stent.  -9/17 GI has decided to hold off on a.m. ERCP.  Will decide over the next couple days whether to repeat ERCP. -Continue antibiotics per GI - 9/17 patient's diet advanced to ice chips. -9/18 s/p repeat ERCP see results below  Hx pancreatitis - Currently abdominal pain significantly improved.  Lipase pending  Hx EtOH abuse  - Discontinued alcohol 4 weeks ago after last episode of pancreatitis  Essential HTN - Slightly elevated but improved - Hydralazine IV PRN - Once cleared for p.o. we will resume patient's home medications.  Tobacco abuse - Patient counseled on need to discontinue.  Hyponatremia -Resolved  Leukocytosis VS leukemoid reaction - See elevated liver enzymes     DVT prophylaxis: Lovenox Code Status: Full Family Communication: 9/17 spoke with Vassie Loll (wife) explained plan of care answered all questions Disposition Plan: PT   Consultants:  [facility]     Procedures/Significant Events:  9/16 ERCP;decompress the bile duct yesterday by Dr. Marca Ancona.  Unfortunately, she was unable to get into the bile duct to place a stent and did place a pancreatic stent.   I have personally reviewed and interpreted all radiology studies and my findings are as above.  VENTILATOR SETTINGS:    Cultures   Antimicrobials: Anti-infectives (From admission, onward)   Start     Stop   11/30/18 0330  piperacillin-tazobactam (ZOSYN) IVPB 3.375 g         11/29/18 2045  piperacillin-tazobactam (ZOSYN) IVPB 3.375 g     11/29/18 2214  Devices    LINES / TUBES:      Continuous Infusions: . sodium chloride 125 mL/hr at 12/03/18 0427  . piperacillin-tazobactam (ZOSYN)  IV 3.375 g (12/03/18 0426)     Objective: Vitals:   12/02/18 0455 12/02/18 1348 12/02/18 2056 12/03/18 0424  BP: 122/71 140/73 (!) 121/58 140/72   Pulse: (!) 51 (!) 55 (!) 53 (!) 51  Resp: 19 19 18    Temp: 97.8 F (36.6 C) 98.2 F (36.8 C) 98.2 F (36.8 C) 98 F (36.7 C)  TempSrc: Oral Oral Oral Oral  SpO2: 99% 98% 99% 98%  Weight:      Height:        Intake/Output Summary (Last 24 hours) at 12/03/2018 0900 Last data filed at 12/03/2018 0500 Gross per 24 hour  Intake 300 ml  Output -  Net 300 ml   Filed Weights   11/29/18 2051  Weight: 127 kg   Physical Exam:  General: A/O x4, no acute respiratory distress Eyes: negative scleral hemorrhage, negative anisocoria, negative icterus ENT: Negative Runny nose, negative gingival bleeding, Neck:  Negative scars, masses, torticollis, lymphadenopathy, JVD Lungs: Clear to auscultation bilaterally without wheezes or crackles Cardiovascular: Regular rate and rhythm without murmur gallop or rub normal S1 and S2 Abdomen: negative abdominal pain, nondistended, positive soft, bowel sounds, no rebound, no ascites, no appreciable mass Extremities: No significant cyanosis, clubbing, or edema bilateral lower extremities Skin: Negative rashes, lesions, ulcers Psychiatric:  Negative depression, negative anxiety, negative fatigue, negative mania  Central nervous system:  Cranial nerves II through XII intact, tongue/uvula midline, all extremities muscle strength 5/5, sensation intact throughout,  negative dysarthria, negative expressive aphasia, negative receptive aphasia.  .     Data Reviewed: Care during the described time interval was provided by me .  I have reviewed this patient's available data, including medical history, events of note, physical examination, and all test results as part of my evaluation.   CBC: Recent Labs  Lab 11/29/18 1514 11/30/18 0329 12/01/18 0126 12/02/18 0105 12/03/18 0307  WBC 14.2* 12.8* 11.5* 11.3* 9.2  NEUTROABS 9.9*  --   --   --  4.7  HGB 15.8 14.8 13.9 12.8* 12.2*  HCT 44.0 42.7 39.2 38.1* 36.5*  MCV 92.1 94.3 92.9 95.7 95.1  PLT 406* 401* 363  360 700   Basic Metabolic Panel: Recent Labs  Lab 11/29/18 1514 11/30/18 0329 12/01/18 0126 12/02/18 0105 12/03/18 0307  NA 130* 135 135 135 135  K 3.7 4.0 3.5 3.9 4.9  CL 97* 103 104 107 108  CO2 21* 20* 19* 17* 17*  GLUCOSE 141* 130* 97 90 72  BUN <5* 5* <5* 9 <5*  CREATININE 0.51* 0.56* 0.55* 0.63 0.62  CALCIUM 9.0 8.6* 8.3* 8.2* 8.0*  MG  --   --   --   --  1.9  PHOS  --   --   --   --  3.3   GFR: Estimated Creatinine Clearance: 132.3 mL/min (by C-G formula based on SCr of 0.62 mg/dL). Liver Function Tests: Recent Labs  Lab 11/29/18 1514 11/30/18 0329 12/01/18 0126 12/02/18 0105 12/03/18 0307  AST 138* 112* 85* 72* 86*  ALT 251* 213* 171* 131* 104*  ALKPHOS 256* 218* 231* 213* 242*  BILITOT 10.7* 11.5* 12.2* 12.1* 13.5*  PROT 7.7 6.9 6.4* 5.9* 5.6*  ALBUMIN 3.4* 3.0* 2.8* 2.4* 2.3*   Recent Labs  Lab 11/29/18 1514 12/01/18 0126 12/03/18 0307  LIPASE 288* 211* 84*   No results for  input(s): AMMONIA in the last 168 hours. Coagulation Profile: Recent Labs  Lab 11/29/18 1831 11/30/18 0329  INR 1.1 1.1   Cardiac Enzymes: No results for input(s): CKTOTAL, CKMB, CKMBINDEX, TROPONINI in the last 168 hours. BNP (last 3 results) No results for input(s): PROBNP in the last 8760 hours. HbA1C: No results for input(s): HGBA1C in the last 72 hours. CBG: Recent Labs  Lab 11/30/18 0829 12/01/18 0801 12/01/18 1720 12/02/18 0827 12/03/18 0842  GLUCAP 118* 100* 93 77 89   Lipid Profile: No results for input(s): CHOL, HDL, LDLCALC, TRIG, CHOLHDL, LDLDIRECT in the last 72 hours. Thyroid Function Tests: No results for input(s): TSH, T4TOTAL, FREET4, T3FREE, THYROIDAB in the last 72 hours. Anemia Panel: No results for input(s): VITAMINB12, FOLATE, FERRITIN, TIBC, IRON, RETICCTPCT in the last 72 hours. Urine analysis:    Component Value Date/Time   COLORURINE AMBER (A) 11/29/2018 1659   APPEARANCEUR CLEAR 11/29/2018 1659   LABSPEC 1.015 11/29/2018 1659    PHURINE 5.0 11/29/2018 1659   GLUCOSEU NEGATIVE 11/29/2018 1659   HGBUR SMALL (A) 11/29/2018 1659   BILIRUBINUR MODERATE (A) 11/29/2018 1659   KETONESUR 20 (A) 11/29/2018 1659   PROTEINUR NEGATIVE 11/29/2018 1659   NITRITE NEGATIVE 11/29/2018 1659   LEUKOCYTESUR NEGATIVE 11/29/2018 1659   Sepsis Labs: @LABRCNTIP (procalcitonin:4,lacticidven:4)  ) Recent Results (from the past 240 hour(s))  SARS Coronavirus 2 [facility](Hospital order, Performed [facility]) Nasopharyngeal Nasopharyngeal Swab     Status: None   Collection Time: 11/29/18  5:27 PM   Specimen: Nasopharyngeal Swab  Result Value Ref Range Status   SARS Coronavirus 2 NEGATIVE NEGATIVE Final    Comment: (NOTE) If result is NEGATIVE SARS-CoV-2 target nucleic acids are NOT DETECTED. The SARS-CoV-2 RNA is generally detectable in upper and lower  respiratory specimens during the acute phase of infection. The lowest  concentration of SARS-CoV-2 viral copies this assay can detect is 250  copies / mL. A negative result does not preclude SARS-CoV-2 infection  and should not be used as the sole basis for treatment or other  patient management decisions.  A negative result may occur with  improper specimen collection / handling, submission of specimen other  than nasopharyngeal swab, presence of viral mutation(s) within the  areas targeted by this assay, and inadequate number of viral copies  (<250 copies / mL). A negative result must be combined with clinical  observations, patient history, and epidemiological information. If result is POSITIVE SARS-CoV-2 target nucleic acids are DETECTED. The SARS-CoV-2 RNA is generally detectable in upper and lower  respiratory specimens dur ing the acute phase of infection.  Positive  results are indicative of active infection with SARS-CoV-2.  Clinical  correlation with patient history and other diagnostic information is  necessary to determine patient infection status.  Positive  results do  not rule out bacterial infection or co-infection with other viruses. If result is PRESUMPTIVE POSTIVE SARS-CoV-2 nucleic acids MAY BE PRESENT.   A presumptive positive result was obtained on the submitted specimen  and confirmed on repeat testing.  While 2019 novel coronavirus  (SARS-CoV-2) nucleic acids may be present in the submitted sample  additional confirmatory testing may be necessary for epidemiological  and / or clinical management purposes  to differentiate between  SARS-CoV-2 and other Sarbecovirus currently known to infect humans.  If clinically indicated additional testing with an alternate test  methodology [phone] is advised. The SARS-CoV-2 RNA is generally  detectable in upper and lower respiratory sp ecimens during the acute  phase of infection. The expected result is Negative. Fact Sheet for Patients:  BoilerBrush.com.cy Fact Sheet for Healthcare Providers: https://pope.com/ This test is not yet approved or cleared by the [place] FDA and has been authorized for detection and/or diagnosis of SARS-CoV-2 by FDA under an Emergency Use Authorization (EUA).  This EUA will remain in effect (meaning this test can be used) for the duration of the COVID-19 declaration under Section 564(b)(1) of the Act, 21 U.S.C. section 360bbb-3(b)(1), unless the authorization is terminated or revoked sooner. Performed at [facility], [address], [place], [place] [address]   Culture, blood (Routine X 2) w Reflex to ID Panel     Status: None (Preliminary result)   Collection Time: 11/30/18  3:25 AM   Specimen: BLOOD RIGHT HAND  Result Value Ref Range Status   Specimen Description BLOOD RIGHT HAND  Final   Special Requests   Final    BOTTLES DRAWN AEROBIC AND ANAEROBIC Blood Culture results may not be optimal due to an inadequate volume of blood received in culture bottles   Culture   Final    NO GROWTH 2 DAYS  Performed at [facility], [address] [place], [place] [address]    Report Status PENDING  Incomplete  Culture, blood (Routine X 2) w Reflex to ID Panel     Status: None (Preliminary result)   Collection Time: 11/30/18  3:33 AM   Specimen: BLOOD LEFT HAND  Result Value Ref Range Status   Specimen Description BLOOD LEFT HAND  Final   Special Requests   Final    BOTTLES DRAWN AEROBIC AND ANAEROBIC Blood Culture adequate volume   Culture   Final    NO GROWTH 2 DAYS Performed at [facility], [address] [place], [place] [address]    Report Status PENDING  Incomplete         Radiology Studies: Dg Ercp Biliary & Pancreatic Ducts  Result Date: 12/01/2018 CLINICAL DATA:  58 year old male status post unsuccessful ERCP EXAM: ERCP TECHNIQUE: Multiple spot images obtained with the fluoroscopic device and submitted for interpretation post-procedure. FLUOROSCOPY TIME:  Fluoroscopy Time:  2 minutes 26 seconds COMPARISON:  None. FINDINGS: A single intra procedural saved image is submitted for review. The image demonstrates a flexible endoscope in the descending duodenum with wire cannulation of the pancreatic duct. IMPRESSION: Wire cannulation of the pancreatic duct. These images were submitted for radiologic interpretation only. Please see the procedural report for the amount of contrast and the fluoroscopy time utilized. Electronically Signed   By: Malachy Moan M.D.   On: 12/01/2018 19:46        Scheduled Meds: . amLODipine  5 mg Oral Daily  . calcium carbonate  1 tablet Oral Daily  . enoxaparin (LOVENOX) injection  40 mg Subcutaneous Q24H  . multivitamin with minerals  1 tablet Oral Daily  . nicotine  21 mg Transdermal Daily  . omega-3 acid ethyl esters  1 g Oral Daily  . pantoprazole (PROTONIX) IV  40 mg Intravenous Q12H  . vitamin C  500 mg Oral Daily   Continuous Infusions: . sodium chloride 125 mL/hr at 12/03/18 0427  . piperacillin-tazobactam (ZOSYN)  IV  3.375 g (12/03/18 0426)     LOS: 4 days   The patient is critically ill with multiple organ systems failure and requires high complexity decision making for assessment and support, frequent evaluation and titration of therapies, application of advanced monitoring technologies and extensive interpretation of  multiple databases. Critical Care Time devoted to patient care services described in this note  Time spent: 40 minutes     WOODS, Roselind MessierURTIS J, MD [facility] Pager [phone]  If 7PM-7AM, please contact night-coverage [email or web address] Password [email or web address] 12/03/2018, 9:00 AM

## 2018-12-03 NOTE — Progress Notes (Signed)
Philip Rhodes 12:32 PM  Subjective: Patient seen and examined in hospital computer chart reviewed and case discussed with my partner Dr. 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

## 2018-12-03 NOTE — Transfer of Care (Signed)
Immediate Anesthesia Transfer of Care Note  Patient: Philip Rhodes  Procedure(s) Performed: ENDOSCOPIC RETROGRADE CHOLANGIOPANCREATOGRAPHY (ERCP) (N/A ) SPHINCTEROTOMY BILIARY STENT PLACEMENT  Patient Location: Endoscopy Unit  Anesthesia Type:General  Level of Consciousness: drowsy and patient cooperative  Airway & Oxygen Therapy: Patient Spontanous Breathing and Patient connected to face mask oxygen  Post-op Assessment: Report given to RN and Post -op Vital signs reviewed and stable  Post vital signs: Reviewed and stable  Last Vitals:  Vitals Value Taken Time  BP    Temp    Pulse 76 12/03/18 1327  Resp 16 12/03/18 1327  SpO2 99 % 12/03/18 1327  Vitals shown include unvalidated device data.  Last Pain:  Vitals:   12/03/18 1323  TempSrc: Axillary  PainSc: 0-No pain         Complications: No apparent anesthesia complications

## 2018-12-03 NOTE — Progress Notes (Signed)
Patient places himself on and off CPAP will call if any assistance needed.

## 2018-12-03 NOTE — Anesthesia Postprocedure Evaluation (Signed)
Anesthesia Post Note  Patient: Philip Rhodes  Procedure(s) Performed: ENDOSCOPIC RETROGRADE CHOLANGIOPANCREATOGRAPHY (ERCP) (N/A ) [place]     Patient location during evaluation: PACU Anesthesia Type: General Level of consciousness: awake and alert Pain management: pain level controlled Vital Signs Assessment: post-procedure vital signs reviewed and stable Respiratory status: spontaneous breathing, nonlabored ventilation, respiratory function stable and patient connected to nasal cannula oxygen Cardiovascular status: blood pressure returned to baseline and stable Postop Assessment: no apparent nausea or vomiting Anesthetic complications: no    Last Vitals:  Vitals:   12/03/18 1343 12/03/18 1422  BP: (!) 168/80 (!) 170/79  Pulse: 64 (!) 53  Resp: (!) 21 16  Temp:  36.6 C  SpO2: 97% 99%    Last Pain:  Vitals:   12/03/18 1422  TempSrc: Oral  PainSc:                  [place]

## 2018-12-04 ENCOUNTER — Encounter (HOSPITAL_COMMUNITY): Payer: Self-pay | Admitting: Gastroenterology

## 2018-12-04 DIAGNOSIS — K831 Obstruction of bile duct: Secondary | ICD-10-CM | POA: Diagnosis present

## 2018-12-04 LAB — CBC WITH DIFFERENTIAL/PLATELET
Abs Immature Granulocytes: 0.06 10*3/uL (ref 0.00–0.07)
Basophils Absolute: 0 10*3/uL (ref 0.0–0.1)
Basophils Relative: 0 %
Eosinophils Absolute: 0 10*3/uL (ref 0.0–0.5)
Eosinophils Relative: 0 %
HCT: 38.1 % — ABNORMAL LOW (ref 39.0–52.0)
Hemoglobin: 13.4 g/dL (ref 13.0–17.0)
Immature Granulocytes: 1 %
Lymphocytes Relative: 15 %
Lymphs Abs: 1.2 10*3/uL (ref 0.7–4.0)
MCH: 33.1 pg (ref 26.0–34.0)
MCHC: 35.2 g/dL (ref 30.0–36.0)
MCV: 94.1 fL (ref 80.0–100.0)
Monocytes Absolute: 0.2 10*3/uL (ref 0.1–1.0)
Monocytes Relative: 3 %
Neutro Abs: 6.2 10*3/uL (ref 1.7–7.7)
Neutrophils Relative %: 81 %
Platelets: 373 10*3/uL (ref 150–400)
RBC: 4.05 MIL/uL — ABNORMAL LOW (ref 4.22–5.81)
RDW: 14.5 % (ref 11.5–15.5)
WBC: 7.7 10*3/uL (ref 4.0–10.5)
nRBC: 0 % (ref 0.0–0.2)

## 2018-12-04 LAB — COMPREHENSIVE METABOLIC PANEL
ALT: 95 U/L — ABNORMAL HIGH (ref 0–44)
AST: 71 U/L — ABNORMAL HIGH (ref 15–41)
Albumin: 2.6 g/dL — ABNORMAL LOW (ref 3.5–5.0)
Alkaline Phosphatase: 241 U/L — ABNORMAL HIGH (ref 38–126)
Anion gap: 11 (ref 5–15)
BUN: 6 mg/dL (ref 6–20)
CO2: 15 mmol/L — ABNORMAL LOW (ref 22–32)
Calcium: 8.4 mg/dL — ABNORMAL LOW (ref 8.9–10.3)
Chloride: 106 mmol/L (ref 98–111)
Creatinine, Ser: 0.56 mg/dL — ABNORMAL LOW (ref 0.61–1.24)
GFR calc Af Amer: >60 mL/min (ref >60)
GFR calc non Af Amer: >60 mL/min (ref >60)
Glucose, Bld: 152 mg/dL — ABNORMAL HIGH (ref 70–99)
Potassium: 5.4 mmol/L — ABNORMAL HIGH (ref 3.5–5.1)
Sodium: 132 mmol/L — ABNORMAL LOW (ref 135–145)
Total Bilirubin: 6.8 mg/dL — ABNORMAL HIGH (ref 0.3–1.2)
Total Protein: 6.2 g/dL — ABNORMAL LOW (ref 6.5–8.1)

## 2018-12-04 LAB — PHOSPHORUS: Phosphorus: 3.1 mg/dL (ref 2.5–4.6)

## 2018-12-04 LAB — GLUCOSE, CAPILLARY: Glucose-Capillary: 156 mg/dL — ABNORMAL HIGH (ref 70–99)

## 2018-12-04 LAB — MAGNESIUM: Magnesium: 2 mg/dL (ref 1.7–2.4)

## 2018-12-04 MED ORDER — AMLODIPINE BESYLATE 10 MG PO TABS
10.0000 mg | ORAL_TABLET | Freq: Every day | ORAL | Status: DC
  Administered 2018-12-05: 09:00:00 10 mg via ORAL
  Filled 2018-12-04: qty 1

## 2018-12-04 MED ORDER — AMLODIPINE BESYLATE 5 MG PO TABS
5.0000 mg | ORAL_TABLET | Freq: Once | ORAL | Status: AC
  Administered 2018-12-04: 21:00:00 5 mg via ORAL
  Filled 2018-12-04: qty 1

## 2018-12-04 MED ORDER — LISINOPRIL 20 MG PO TABS
20.0000 mg | ORAL_TABLET | Freq: Every morning | ORAL | Status: DC
  Administered 2018-12-05: 20 mg via ORAL
  Filled 2018-12-04: qty 1

## 2018-12-04 MED ORDER — LISINOPRIL 20 MG PO TABS: 20.0000 mg | ORAL_TABLET | Freq: Every morning | ORAL | Status: DC

## 2018-12-04 MED ORDER — PANTOPRAZOLE SODIUM 40 MG PO TBEC
40.0000 mg | DELAYED_RELEASE_TABLET | Freq: Two times a day (BID) | ORAL | Status: DC
  Administered 2018-12-04 – 2018-12-05 (×3): 40 mg via ORAL
  Filled 2018-12-04 (×3): qty 1

## 2018-12-04 NOTE — Progress Notes (Signed)
PROGRESS NOTE    Philip Rhodes  ZOX:[id number] DOB: October 15, 1960 DOA: 11/29/2018 PCP: Elias Else, MD   Brief Narrative:  58 y.o. WM PMHx hypertension, hyperlipidemia, OSA on CPAP, tobacco abuse, alcohol abuse (quit 4 weeks ago), who presents with abnormal lab.  Patient was recently hospitalized from 8/27-8/21 due to acute pancreatitis. GI was consulted and etiology was not clear. Pt had MRI of the abdomen with and without contrast/MRCP which showed acute pancreatitis and complex nonenhancing cystic lesion in the pancreatic head measures 4.4 x 3.7 x 4.0 cm, and demonstrates eccentric nodular hemorrhagic/proteinaceous debris, most compatible with a pancreatic pseudocyst, stable in size since recent 11/11/2018. Pt also had CT on 8/27 scan which showed no gallstones, gallbladder wall thickening, or biliary dilatation. Pt was discharged in stable condition.  His Bp med, Prinzide was hold due to pancreatitis.  Pt states that he had follow up visit with his primary physician and had lab done today, which showed elevated bilirubin up to 10 and abnormal liver function. He therefore was sent to ED for further evaluation treatment.  Patient states that he had intermittent mild nausea and minimal abdominal discomfort, which has resolved currently.  Currently patient does not have nausea, vomiting or diarrhea.  Denies fever or chills.  Patient does not have chest pain, shortness breath, cough, symptoms of UTI or unilateral weakness.  Patient used to be a heavy drinker, but he quit drinking alcohol 4 weeks ago.   Subjective: 9/19 A/O x4, negative CP, negative S OB, negative postprandial abdominal pain (ate partial breakfast), negative N/V    Assessment & Plan:   Principal Problem:   Elevated LFTs Active Problems:   Essential hypertension   Pancreatitis   Hyponatremia   SIRS (systemic inflammatory response syndrome) (HCC)   Hyperbilirubinemia   Tobacco abuse   History of alcohol abuse   Common  bile duct stricture   Elevated liver enzymes and hyperbilirubinemia/stricture, common bile duct -MRCP concerning for narrowing at CBD -9/16 ERCP; unable to place stent in common bile duct but placed a pancreatic stent.  -9/17 GI has decided to hold off on a.m. ERCP.  Will decide over the next couple days whether to repeat ERCP. -Continue antibiotics per GI - 9/17 patient's diet advanced to ice chips. -9/18 s/p repeat ERCP see results below  Hx pancreatitis - Currently abdominal pain significantly improved.  Lipase trending down  Hx EtOH abuse  - Discontinued alcohol 4 weeks ago after last episode of pancreatitis -Counseled patient and wife at length on need to refrain from any alcoholic beverages upon discharge.  Essential HTN - 9/19 cleared for p.o. and BP trending up  -Increase amlodipine 10 mg daily - Amlodipine 5 mg x 1 dose -Lisinopril 20 mg daily  Tobacco abuse - Patient counseled on need to discontinue.  Hyponatremia -Resolved  Leukocytosis VS leukemoid reaction - See elevated liver enzymes     DVT prophylaxis: Lovenox Code Status: Full Family Communication: 9/19 spoke with Vassie Loll (wife) explained plan of care answered all questions Disposition Plan: PT   Consultants:  [facility]     Procedures/Significant Events:  9/16 ERCP;decompress the bile duct yesterday by Dr. Marca Ancona.  Unfortunately, she was unable to get into the bile duct to place a stent and did place a pancreatic stent. 9/18 Repeat ERCP;One stent from the pancreatic duct was seen in the major papilla. - The major papilla appeared edematous. - A single localized biliary stricture was found in the lower third of the main bile duct. The  stricture was Probably from extrinsic compression from the pseudocyst. This stricture was treated with biliary sphincterotomy. - A sphincterotomy was performed. - One plastic stent was placed into the common bile duct.    I have personally reviewed and  interpreted all radiology studies and my findings are as above.  VENTILATOR SETTINGS:    Cultures   Antimicrobials: Anti-infectives (From admission, onward)   Start     Stop   11/30/18 0330  piperacillin-tazobactam (ZOSYN) IVPB 3.375 g         11/29/18 2045  piperacillin-tazobactam (ZOSYN) IVPB 3.375 g     11/29/18 2214       Devices    LINES / TUBES:      Continuous Infusions:  sodium chloride 125 mL/hr at 12/04/18 1747   piperacillin-tazobactam (ZOSYN)  IV 3.375 g (12/04/18 1223)     Objective: Vitals:   12/03/18 1422 12/03/18 2029 12/04/18 0440 12/04/18 1555  BP: (!) 170/79 (!) 144/72 (!) 151/76 136/71  Pulse: (!) 53 60 (!) 57 62  Resp: 16 18 18 19   Temp: 97.9 F (36.6 C) 98 F (36.7 C) 98.9 F (37.2 C) 97.7 F (36.5 C)  TempSrc: Oral Oral  Oral  SpO2: 99% 98% 99% 100%  Weight:      Height:        Intake/Output Summary (Last 24 hours) at 12/04/2018 1856 Last data filed at 12/04/2018 1800 Gross per 24 hour  Intake 7276.22 ml  Output --  Net 7276.22 ml   Filed Weights   11/29/18 2051 12/03/18 1151  Weight: 127 kg 122.9 kg   Physical Exam:  General: A/O x4, no acute respiratory distress Eyes: negative scleral hemorrhage, negative anisocoria, negative icterus ENT: Negative Runny nose, negative gingival bleeding, Neck:  Negative scars, masses, torticollis, lymphadenopathy, JVD Lungs: Clear to auscultation bilaterally without wheezes or crackles Cardiovascular: Regular rate and rhythm without murmur gallop or rub normal S1 and S2 Abdomen: negative abdominal pain, nondistended, positive soft, bowel sounds, no rebound, no ascites, no appreciable mass Extremities: No significant cyanosis, clubbing, or edema bilateral lower extremities Skin: Negative rashes, lesions, ulcers Psychiatric:  Negative depression, negative anxiety, negative fatigue, negative mania  Central nervous system:  Cranial nerves II through XII intact, tongue/uvula midline, all  extremities muscle strength 5/5, sensation intact throughout, negative dysarthria, negative expressive aphasia, negative receptive aphasia.      Data Reviewed: Care during the described time interval was provided by me .  I have reviewed this patient's available data, including medical history, events of note, physical examination, and all test results as part of my evaluation.   CBC: Recent Labs  Lab 11/29/18 1514 11/30/18 0329 12/01/18 0126 12/02/18 0105 12/03/18 0307 12/04/18 0328  WBC 14.2* 12.8* 11.5* 11.3* 9.2 7.7  NEUTROABS 9.9*  --   --   --  4.7 6.2  HGB 15.8 14.8 13.9 12.8* 12.2* 13.4  HCT 44.0 42.7 39.2 38.1* 36.5* 38.1*  MCV 92.1 94.3 92.9 95.7 95.1 94.1  PLT 406* 401* 363 360 369 373   Basic Metabolic Panel: Recent Labs  Lab 11/30/18 0329 12/01/18 0126 12/02/18 0105 12/03/18 0307 12/04/18 0328  NA 135 135 135 135 132*  K 4.0 3.5 3.9 4.9 5.4*  CL 103 104 107 108 106  CO2 20* 19* 17* 17* 15*  GLUCOSE 130* 97 90 72 152*  BUN 5* <5* 9 <5* 6  CREATININE 0.56* 0.55* 0.63 0.62 0.56*  CALCIUM 8.6* 8.3* 8.2* 8.0* 8.4*  MG  --   --   --  1.9 2.0  PHOS  --   --   --  3.3 3.1   GFR: Estimated Creatinine Clearance: 128 mL/min (A) (by C-G formula based on SCr of 0.56 mg/dL (L)). Liver Function Tests: Recent Labs  Lab 11/30/18 0329 12/01/18 0126 12/02/18 0105 12/03/18 0307 12/04/18 0328  AST 112* 85* 72* 86* 71*  ALT 213* 171* 131* 104* 95*  ALKPHOS 218* 231* 213* 242* 241*  BILITOT 11.5* 12.2* 12.1* 13.5* 6.8*  PROT 6.9 6.4* 5.9* 5.6* 6.2*  ALBUMIN 3.0* 2.8* 2.4* 2.3* 2.6*   Recent Labs  Lab 11/29/18 1514 12/01/18 0126 12/03/18 0307  LIPASE 288* 211* 84*   No results for input(s): AMMONIA in the last 168 hours. Coagulation Profile: Recent Labs  Lab 11/29/18 1831 11/30/18 0329  INR 1.1 1.1   Cardiac Enzymes: No results for input(s): CKTOTAL, CKMB, CKMBINDEX, TROPONINI in the last 168 hours. BNP (last 3 results) No results for input(s): PROBNP  in the last 8760 hours. HbA1C: No results for input(s): HGBA1C in the last 72 hours. CBG: Recent Labs  Lab 12/01/18 0801 12/01/18 1720 12/02/18 0827 12/03/18 0842 12/04/18 0741  GLUCAP 100* 93 77 89 156*   Lipid Profile: No results for input(s): CHOL, HDL, LDLCALC, TRIG, CHOLHDL, LDLDIRECT in the last 72 hours. Thyroid Function Tests: No results for input(s): TSH, T4TOTAL, FREET4, T3FREE, THYROIDAB in the last 72 hours. Anemia Panel: No results for input(s): VITAMINB12, FOLATE, FERRITIN, TIBC, IRON, RETICCTPCT in the last 72 hours. Urine analysis:    Component Value Date/Time   COLORURINE AMBER (A) 11/29/2018 1659   APPEARANCEUR CLEAR 11/29/2018 1659   LABSPEC 1.015 11/29/2018 1659   PHURINE 5.0 11/29/2018 1659   GLUCOSEU NEGATIVE 11/29/2018 1659   HGBUR SMALL (A) 11/29/2018 1659   BILIRUBINUR MODERATE (A) 11/29/2018 1659   KETONESUR 20 (A) 11/29/2018 1659   PROTEINUR NEGATIVE 11/29/2018 1659   NITRITE NEGATIVE 11/29/2018 1659   LEUKOCYTESUR NEGATIVE 11/29/2018 1659   Sepsis Labs: @LABRCNTIP (procalcitonin:4,lacticidven:4)  ) Recent Results (from the past 240 hour(s))  SARS Coronavirus 2 [facility] order, Performed in [facility] hospital lab) Nasopharyngeal Nasopharyngeal Swab     Status: None   Collection Time: 11/29/18  5:27 PM   Specimen: Nasopharyngeal Swab  Result Value Ref Range Status   SARS Coronavirus 2 NEGATIVE NEGATIVE Final    Comment: (NOTE) If result is NEGATIVE SARS-CoV-2 target nucleic acids are NOT DETECTED. The SARS-CoV-2 RNA is generally detectable in upper and lower  respiratory specimens during the acute phase of infection. The lowest  concentration of SARS-CoV-2 viral copies this assay can detect is 250  copies / mL. A negative result does not preclude SARS-CoV-2 infection  and should not be used as the sole basis for treatment or other  patient management decisions.  A negative result may occur with  improper specimen collection / handling,  submission of specimen other  than nasopharyngeal swab, presence of viral mutation(s) within the  areas targeted by this assay, and inadequate number of viral copies  (<250 copies / mL). A negative result must be combined with clinical  observations, patient history, and epidemiological information. If result is POSITIVE SARS-CoV-2 target nucleic acids are DETECTED. The SARS-CoV-2 RNA is generally detectable in upper and lower  respiratory specimens dur ing the acute phase of infection.  Positive  results are indicative of active infection with SARS-CoV-2.  Clinical  correlation with patient history and other diagnostic information is  necessary to determine patient infection status.  Positive results do  not rule  out bacterial infection or co-infection with other viruses. If result is PRESUMPTIVE POSTIVE SARS-CoV-2 nucleic acids MAY BE PRESENT.   A presumptive positive result was obtained on the submitted specimen  and confirmed on repeat testing.  While 2019 novel coronavirus  (SARS-CoV-2) nucleic acids may be present in the submitted sample  additional confirmatory testing may be necessary for epidemiological  and / or clinical management purposes  to differentiate between  SARS-CoV-2 and other Sarbecovirus currently known to infect humans.  If clinically indicated additional testing with an alternate test  methodology [phone]([id number]) is advised. The SARS-CoV-2 RNA is generally  detectable in upper and lower respiratory sp ecimens during the acute  phase of infection. The expected result is Negative. Fact Sheet for Patients:  BoilerBrush.com.cyhttps://www.fda.gov/media/136312/download Fact Sheet for Healthcare Providers: https://pope.com/https://www.fda.gov/media/136313/download This test is not yet approved or cleared by the [place] FDA and has been authorized for detection and/or diagnosis of SARS-CoV-2 by FDA under an Emergency Use Authorization (EUA).  This EUA will remain in effect (meaning this test can be  used) for the duration of the COVID-19 declaration under Section 564(b)(1) of the Act, 21 U.S.C. section 360bbb-3(b)(1), unless the authorization is terminated or revoked sooner. Performed at [facility], [address] [place], [place] [address]   Culture, blood (Routine X 2) w Reflex to ID Panel     Status: None (Preliminary result)   Collection Time: 11/30/18  3:25 AM   Specimen: BLOOD RIGHT HAND  Result Value Ref Range Status   Specimen Description BLOOD RIGHT HAND  Final   Special Requests   Final    BOTTLES DRAWN AEROBIC AND ANAEROBIC Blood Culture results may not be optimal due to an inadequate volume of blood received in culture bottles   Culture   Final    NO GROWTH 4 DAYS Performed at [facility], [address] [place], [place] [address]    Report Status PENDING  Incomplete  Culture, blood (Routine X 2) w Reflex to ID Panel     Status: None (Preliminary result)   Collection Time: 11/30/18  3:33 AM   Specimen: BLOOD LEFT HAND  Result Value Ref Range Status   Specimen Description BLOOD LEFT HAND  Final   Special Requests   Final    BOTTLES DRAWN AEROBIC AND ANAEROBIC Blood Culture adequate volume   Culture   Final    NO GROWTH 4 DAYS Performed at [facility], [address] [place], [place] [address]    Report Status PENDING  Incomplete         Radiology Studies: Dg Ercp Biliary & Pancreatic Ducts  Result Date: 12/03/2018 CLINICAL DATA:  Pancreatitis.  Abnormal LFTs. EXAM: ERCP TECHNIQUE: Multiple spot images obtained with the fluoroscopic device and submitted for interpretation post-procedure. FLUOROSCOPY TIME:  52 seconds COMPARISON:  None. FINDINGS: Three spot fluoroscopic images of the right upper abdominal quadrant during ERCP are provided for review Initial image demonstrates an ERCP probe overlying the right upper abdominal quadrant. There is selective cannulation opacification of the mid and central aspects of the CBD and minimal  opacification of the central aspect of the intrahepatic biliary tree all of which appear mildly dilated. There is minimal opacification the cystic duct. No discrete persistent intraluminal filling defects are seen suggest the presence of choledocholithiasis. Completion image demonstrates placement of an internal biliary stent overlying the expected location of the CBD. IMPRESSION: ERCP with biliary stent placement. These images were submitted for radiologic interpretation only. Please  see the procedural report for the amount of contrast and the fluoroscopy time utilized. Electronically Signed   By: Simonne ComeJohn  Watts M.D.   On: 12/03/2018 13:32        Scheduled Meds:  [START ON 12/05/2018] amLODipine  10 mg Oral Daily   amLODipine  5 mg Oral Once   calcium carbonate  1 tablet Oral Daily   enoxaparin (LOVENOX) injection  40 mg Subcutaneous Q24H   lisinopril  20 mg Oral q morning - 10a   multivitamin with minerals  1 tablet Oral Daily   nicotine  21 mg Transdermal Daily   omega-3 acid ethyl esters  1 g Oral Daily   pantoprazole  40 mg Oral BID   vitamin C  500 mg Oral Daily   Continuous Infusions:  sodium chloride 125 mL/hr at 12/04/18 1747   piperacillin-tazobactam (ZOSYN)  IV 3.375 g (12/04/18 1223)     LOS: 5 days   The patient is critically ill with multiple organ systems failure and requires high complexity decision making for assessment and support, frequent evaluation and titration of therapies, application of advanced monitoring technologies and extensive interpretation of multiple databases. Critical Care Time devoted to patient care services described in this note  Time spent: 40 minutes     Ayaz Sondgeroth, Roselind MessierURTIS J, MD [facility] Pager [phone]  If 7PM-7AM, please contact night-coverage [email or web address] Password [email or web address] 12/04/2018, 6:56 PM

## 2018-12-04 NOTE — Progress Notes (Signed)
[facility] Gastroenterology Progress Note  Philip Rhodes 58 y.o. 06/17/1960  CC: Pancreatitis, CBD obstruction from pseudocyst   Subjective: Status post ERCP with sphincterotomy and plastic CBD stent placement yesterday.  Feeling much better today.  Denies abdominal pain, nausea and vomiting.  Tolerating regular diet.  ROS : Negative for chest pain and shortness of breath   Objective: Vital signs in last 24 hours: Vitals:   12/03/18 2029 12/04/18 0440  BP: (!) 144/72 (!) 151/76  Pulse: 60 (!) 57  Resp: 18 18  Temp: 98 F (36.7 C) 98.9 F (37.2 C)  SpO2: 98% 99%    Physical Exam:  General:  Alert, cooperative, no distress, appears stated age  Head:  Normocephalic, without obvious abnormality, atraumatic  Eyes:   Mild tenderness noted  Lungs:   Clear to auscultation bilaterally, respirations unlabored  Heart:  Regular rate and rhythm, S1, S2 normal  Abdomen:   Soft, non-tender, minimally distended, bowel sounds present, no peritoneal signs  Extremities: Extremities normal, atraumatic, no  edema  Pulses: 2+ and symmetric    Lab Results: Recent Labs    12/03/18 0307 12/04/18 0328  NA 135 132*  K 4.9 5.4*  CL 108 106  CO2 17* 15*  GLUCOSE 72 152*  BUN <5* 6  CREATININE 0.62 0.56*  CALCIUM 8.0* 8.4*  MG 1.9 2.0  PHOS 3.3 3.1   Recent Labs    12/03/18 0307 12/04/18 0328  AST 86* 71*  ALT 104* 95*  ALKPHOS 242* 241*  BILITOT 13.5* 6.8*  PROT 5.6* 6.2*  ALBUMIN 2.3* 2.6*   Recent Labs    12/03/18 0307 12/04/18 0328  WBC 9.2 7.7  NEUTROABS 4.7 6.2  HGB 12.2* 13.4  HCT 36.5* 38.1*  MCV 95.1 94.1  PLT 369 373   No results for input(s): LABPROT, INR in the last 72 hours.    Assessment/Plan: -Obstructive jaundice from CBD stricture from extrinsic compression of CBD from pancreatic pseudocyst.  Status post ERCP with sphincterotomy and plastic stent placement yesterday.  LFTs improving. -Alcoholic pancreatitis with  pseudocyst.  Recommendations ------------------------- -Patient feeling better. -Advised to stay on low-fat diet. - We will arrange for repeat x-ray to check for pancreatic duct stent as well as CBD stent in 1 to 2 weeks -Consider removing CBD stent once pseudocyst are resolved.  May consider EUS guided drainage if no improvement in pseudocyst in Next 1 to 2 months. -Absolute alcohol abstinence recommended. -Okay to discharge from GI standpoint.  GI will sign off.  Call us back if needed -Follow-up in GI as scheduled in 2 weeks.    Otis Brace MD, [place] 12/04/2018, 11:42 AM  Contact #  [phone]

## 2018-12-04 NOTE — Progress Notes (Signed)
Patient places himself on CPAP and will call if any assistance needed. 

## 2018-12-05 LAB — CULTURE, BLOOD (ROUTINE X 2)
Culture: NO GROWTH
Culture: NO GROWTH
Special Requests: ADEQUATE

## 2018-12-05 LAB — COMPREHENSIVE METABOLIC PANEL
ALT: 90 U/L — ABNORMAL HIGH (ref 0–44)
AST: 51 U/L — ABNORMAL HIGH (ref 15–41)
Albumin: 2.6 g/dL — ABNORMAL LOW (ref 3.5–5.0)
Alkaline Phosphatase: 189 U/L — ABNORMAL HIGH (ref 38–126)
Anion gap: 8 (ref 5–15)
BUN: 6 mg/dL (ref 6–20)
CO2: 21 mmol/L — ABNORMAL LOW (ref 22–32)
Calcium: 8.3 mg/dL — ABNORMAL LOW (ref 8.9–10.3)
Chloride: 110 mmol/L (ref 98–111)
Creatinine, Ser: 0.58 mg/dL — ABNORMAL LOW (ref 0.61–1.24)
GFR calc Af Amer: >60 mL/min (ref >60)
GFR calc non Af Amer: >60 mL/min (ref >60)
Glucose, Bld: 118 mg/dL — ABNORMAL HIGH (ref 70–99)
Potassium: 3.3 mmol/L — ABNORMAL LOW (ref 3.5–5.1)
Sodium: 139 mmol/L (ref 135–145)
Total Bilirubin: 4.4 mg/dL — ABNORMAL HIGH (ref 0.3–1.2)
Total Protein: 6 g/dL — ABNORMAL LOW (ref 6.5–8.1)

## 2018-12-05 LAB — CBC WITH DIFFERENTIAL/PLATELET
Abs Immature Granulocytes: 0.06 10*3/uL (ref 0.00–0.07)
Basophils Absolute: 0.1 10*3/uL (ref 0.0–0.1)
Basophils Relative: 1 %
Eosinophils Absolute: 0.2 10*3/uL (ref 0.0–0.5)
Eosinophils Relative: 2 %
HCT: 34.8 % — ABNORMAL LOW (ref 39.0–52.0)
Hemoglobin: 12.3 g/dL — ABNORMAL LOW (ref 13.0–17.0)
Immature Granulocytes: 1 %
Lymphocytes Relative: 42 %
Lymphs Abs: 4.2 10*3/uL — ABNORMAL HIGH (ref 0.7–4.0)
MCH: 32.9 pg (ref 26.0–34.0)
MCHC: 35.3 g/dL (ref 30.0–36.0)
MCV: 93 fL (ref 80.0–100.0)
Monocytes Absolute: 0.8 10*3/uL (ref 0.1–1.0)
Monocytes Relative: 8 %
Neutro Abs: 4.7 10*3/uL (ref 1.7–7.7)
Neutrophils Relative %: 46 %
Platelets: 335 10*3/uL (ref 150–400)
RBC: 3.74 MIL/uL — ABNORMAL LOW (ref 4.22–5.81)
RDW: 14.6 % (ref 11.5–15.5)
WBC: 10.1 10*3/uL (ref 4.0–10.5)
nRBC: 0 % (ref 0.0–0.2)

## 2018-12-05 LAB — MAGNESIUM: Magnesium: 1.7 mg/dL (ref 1.7–2.4)

## 2018-12-05 LAB — PHOSPHORUS: Phosphorus: 3.8 mg/dL (ref 2.5–4.6)

## 2018-12-05 MED ORDER — SENNOSIDES-DOCUSATE SODIUM 8.6-50 MG PO TABS: 1.0000 | ORAL_TABLET | Freq: Every evening | ORAL | 0 refills | Status: AC | PRN

## 2018-12-05 MED ORDER — AMLODIPINE BESYLATE 10 MG PO TABS: 10.0000 mg | ORAL_TABLET | Freq: Every day | ORAL | 0 refills | Status: AC

## 2018-12-05 MED ORDER — NICOTINE 21 MG/24HR TD PT24: 21.0000 mg | MEDICATED_PATCH | Freq: Every day | TRANSDERMAL | 0 refills | Status: AC

## 2018-12-05 MED ORDER — PANTOPRAZOLE SODIUM 40 MG PO TBEC: 40.0000 mg | DELAYED_RELEASE_TABLET | Freq: Two times a day (BID) | ORAL | 0 refills | Status: AC

## 2018-12-05 NOTE — Discharge Summary (Signed)
Physician Discharge Summary  Philip Rhodes MWU:[id number] DOB: 08/23/1960 DOA: 11/29/2018  PCP: Philip Dus, MD  Admit date: 11/29/2018 Discharge date: 12/05/2018  Time spent: 30 minutes  Recommendations for Outpatient Follow-up:   Elevated liver enzymes and hyperbilirubinemia/stricture, common bile duct -MRCP concerning for narrowing at CBD -9/16 ERCP; unable to place stent in common bile duct but placed a pancreatic stent.  -9/17 GI has decided to hold off on a.m. ERCP.  Will decide over the next couple days whether to repeat ERCP. -Continue antibiotics per GI - 9/17 patient's diet advanced to ice chips. -9/18 s/p repeat ERCP see results below - Schedule follow-up appointment in 2 weeks with Dr.Parag Rhodes common bile duct/pancreatic duct obstruction s/p stent placement. -Schedule follow-up appointment in 1 week with Dr. Maury Rhodes common bile duct/pancreatic duct obstruction with elevated enzymes.  Monitor enzyme level  Hx ETOH pancreatitis - Currently abdominal pain significantly improved.  Lipase trending down  Hx EtOH abuse  - Discontinued alcohol 4 weeks ago after last episode of pancreatitis -Counseled patient and wife at length on need to refrain from any alcoholic beverages upon discharge.  Essential HTN - 9/19 cleared for p.o. and BP trending up  -Increase amlodipine 10 mg daily - Amlodipine 5 mg x 1 dose -Lisinopril 20 mg daily  Tobacco abuse - Patient counseled on need to discontinue.  Hyponatremia -Resolved  Leukocytosis VS leukemoid reaction - See elevated liver enzymes    Discharge Diagnoses:  Principal Problem:   Elevated LFTs Active Problems:   Essential hypertension   Pancreatitis   Hyponatremia   SIRS (systemic inflammatory response syndrome) (HCC)   Hyperbilirubinemia   Tobacco abuse   History of alcohol abuse   Common bile duct stricture   Discharge Condition: stable  Diet recommendation: Low fat; No ETOH   Filed  Weights   11/29/18 2051 12/03/18 1151  Weight: 127 kg 122.9 kg    History of present illness:  58 y.o.WM PMHx hypertension, hyperlipidemia, OSA on CPAP, tobacco abuse, alcohol abuse (quit 4 weeks ago), who presents with abnormal lab.  Patient was recently hospitalized from 8/27-8/21 due to acute pancreatitis. GI was consultedandetiology was not clear.Pt had MRI of the abdomen with and without contrast/MRCPwhich showed acute pancreatitisand complex nonenhancing cystic lesion in the pancreatic head measures 4.4 x 3.7 x 4.0 cm, and demonstrates eccentric nodular hemorrhagic/proteinaceous debris, most compatible with a pancreatic pseudocyst, stable in size since recent 11/11/2018.Pt also had CT on 8/27 scan which showed nogallstones, gallbladder wall thickening, or biliary dilatation. Pt wasdischarged in stable condition. His Bp med,Prinzide was hold due to pancreatitis.  Pt states that he had follow up visit with his primary physician and had lab done today, which showedelevated bilirubin up to 10 and abnormal liver function. Hetherefore was sent to ED for further evaluation treatment. Patient states that he had intermittent mild nausea and minimal abdominal discomfort,which has resolved currently. Currently patient does not have nausea,vomiting or diarrhea. Denies fever or chills. Patient does not have chest pain, shortness breath, cough, symptoms of UTI or unilateral weakness. Patient used to be aheavy drinker, but he quit drinking alcohol 4 weeks ago.  Hospital Course:    Procedures: 9/16 ERCP;decompress the bile duct yesterday by Dr. Therisa Rhodes. Unfortunately, she was unable to get into the bile duct to place a stent and did place a pancreatic stent. 9/18 Repeat ERCP;One stent from the pancreatic duct was seen in the major papilla. - The major papilla appeared edematous. - A single localized biliary stricture was found  in the lower third of the main bile duct. The stricture  was Probably from extrinsic compression from the pseudocyst. This stricture was treated with biliary sphincterotomy. - A sphincterotomy was performed. - One plastic stent was placed into the common bile duct.   Consultations: [facility]    Cultures  9/14 SARS coronavirus negative 9/15 blood RIGHT hand NGTD 9/15 blood LEFT hand NGTD   Antibiotics Anti-infectives (From admission, onward)   Start     Stop   11/30/18 0330  piperacillin-tazobactam (ZOSYN) IVPB 3.375 g         11/29/18 2045  piperacillin-tazobactam (ZOSYN) IVPB 3.375 g     11/29/18 2214       Discharge Exam: Vitals:   12/04/18 0440 12/04/18 1555 12/04/18 2048 12/05/18 0440  BP: (!) 151/76 136/71 137/78 118/66  Pulse: (!) 57 62 (!) 56 63  Resp: 18 19 18 16   Temp: 98.9 F (37.2 C) 97.7 F (36.5 C) 98.5 F (36.9 C) 97.8 F (36.6 C)  TempSrc:  Oral Oral Oral  SpO2: 99% 100% 100% 97%  Weight:      Height:        General: A/O x4, no acute respiratory distress Eyes: negative scleral hemorrhage, negative anisocoria, negative icterus ENT: Negative Runny nose, negative gingival bleeding, Neck:  Negative scars, masses, torticollis, lymphadenopathy, JVD Lungs: Clear to auscultation bilaterally without wheezes or crackles Cardiovascular: Regular rate and rhythm without murmur gallop or rub normal S1 and S2 Abdomen: negative abdominal pain, nondistended, positive soft, bowel sounds, no rebound, no ascites, no appreciable mass    Discharge Instructions   Allergies as of 12/05/2018   No Known Allergies     Medication List    TAKE these medications   amLODipine 10 MG tablet Commonly known as: NORVASC Take 1 tablet (10 mg total) by mouth daily. Start taking on: December 06, 2018 What changed:   medication strength  how much to take   aspirin EC 81 MG tablet Take 81 mg by mouth at bedtime.   calcium carbonate 500 MG chewable tablet Commonly known as: TUMS - dosed in mg elemental calcium Chew 1  tablet by mouth daily.   FISH OIL PO Take 1 tablet by mouth daily.   HYDROcodone-acetaminophen 5-325 MG tablet Commonly known as: NORCO/VICODIN Take 1 tablet by mouth every 6 (six) hours as needed for pain.   ibuprofen 200 MG tablet Commonly known as: ADVIL Take 200 mg by mouth every 6 (six) hours as needed for headache or mild pain.   lisinopril 20 MG tablet Commonly known as: ZESTRIL Take 20 mg by mouth every morning.   MULTIVITAMIN ADULT PO Take 1 tablet by mouth daily.   nicotine 21 mg/24hr patch Commonly known as: NICODERM CQ - dosed in mg/24 hours Place 1 patch (21 mg total) onto the skin daily. Start taking on: December 06, 2018   pantoprazole 40 MG tablet Commonly known as: PROTONIX Take 1 tablet (40 mg total) by mouth 2 (two) times daily.   senna-docusate 8.6-50 MG tablet Commonly known as: Senokot-S Take 1 tablet by mouth at bedtime as needed for mild constipation.   simvastatin 40 MG tablet Commonly known as: ZOCOR Take 40 mg by mouth every evening.   VITAMIN C PO Take 1 tablet by mouth daily.      No Known Allergies Follow-up Information    Rhodes, Parag, MD. Schedule an appointment as soon as possible for a visit in 2 week(s).   Specialty: Gastroenterology Why: Schedule follow-up appointment  in 2 weeks with Dr.Parag Rhodes common bile duct/pancreatic duct obstruction s/p stent placement Contact information: [address] [place] [address] [phone]        Elias Else, MD. Schedule an appointment as soon as possible for a visit in 1 week(s).   Specialty: Family Medicine Why: Schedule follow-up appointment in 1 week with Dr. Elias Else common bile duct/pancreatic duct obstruction with elevated enzymes.  Monitor enzyme level Contact information: [address] [place] [address] [phone]            The results of significant diagnostics from this hospitalization (including imaging,  microbiology, ancillary and laboratory) are listed below for reference.    Significant Diagnostic Studies: Dg Chest 2 View  Result Date: 11/11/2018 CLINICAL DATA:  Sepsis.  Hypertension. EXAM: CHEST - 2 VIEW COMPARISON:  12/07/2007 FINDINGS: Heart size is mildly enlarged. Mediastinal shadows are normal. No evidence of pneumonia, collapse or effusion. Density in the left upper chest region on the frontal view probably relates to the first rib and, but the possibility of a left upper lobe mass is not excluded. Consider chest CT. No bone finding otherwise. IMPRESSION: No sign of pneumonia, collapse or effusion. Asymmetric density in the upper left chest on the frontal view. Whereas this could represent density related to the first rib end, lung mass is not excluded and chest CT would be suggested. Electronically Signed   By: Paulina Fusi M.D.   On: 11/11/2018 21:02   Ct Chest W Contrast  Result Date: 11/13/2018 CLINICAL DATA:  Possible left upper lobe lung mass on chest x-ray. EXAM: CT CHEST WITH CONTRAST TECHNIQUE: Multidetector CT imaging of the chest was performed during intravenous contrast administration. CONTRAST:  26mL OMNIPAQUE IOHEXOL 300 MG/ML  SOLN COMPARISON:  Chest x-ray from yesterday. FINDINGS: Cardiovascular: Normal heart size. No pericardial effusion. No thoracic aortic aneurysm or dissection. Left anterior descending coronary artery atherosclerotic calcification. No central pulmonary embolism. Mediastinum/Nodes: No enlarged mediastinal, hilar, or axillary lymph nodes. Thyroid gland, trachea, and esophagus demonstrate no significant findings. Lungs/Pleura: Lungs are clear. No pleural effusion or pneumothorax. Upper Abdomen: Inflammatory changes surrounding the pancreatic head have mildly increased. Unchanged 4.5 cm fluid collection in the pancreatic head. Musculoskeletal: No chest wall abnormality. No acute or significant osseous findings. Prominent bony hypertrophy of the left first rib  sternocostal synchondrosis. Mild thoracic spondylosis. IMPRESSION: 1. No lung mass. Density seen on chest x-ray corresponds to prominent bony hypertrophy of the left first rib sternocostal synchondrosis. 2.  No acute intrathoracic process. 3. Slightly increased peripancreatic head inflammatory changes with unchanged 4.5 cm fluid collection, likely a pseudocyst. Follow-up pancreatic protocol CT of the abdomen with and without contrast in 3 months is recommended to assess for interval change and exclude the unlikely possibility of underlying malignancy. Electronically Signed   By: Obie Dredge M.D.   On: 11/13/2018 12:53   Ct Abdomen Pelvis W Contrast  Result Date: 11/29/2018 CLINICAL DATA:  Acute abdominal pain and nausea and vomiting. Jaundice. Elevated liver enzymes. Pancreatitis. EXAM: CT ABDOMEN AND PELVIS WITH CONTRAST TECHNIQUE: Multidetector CT imaging of the abdomen and pelvis was performed using the standard protocol following bolus administration of intravenous contrast. CONTRAST:  OMNIPAQUE IOHEXOL 300 MG/ML  SOLN COMPARISON:  11/11/2018 FINDINGS: Lower Chest: No acute findings. Hepatobiliary: No hepatic masses identified. Mild diffuse biliary ductal dilatation is increased since prior study. Gallbladder is unremarkable. Pancreas: Mild acute pancreatitis shows no significant change compared to prior study. A cystic  lesion is seen in the pancreatic head measuring 4.5 x 3.8 cm, which is stable in size and most likely represents a pseudocyst. Spleen: Within normal limits in size and appearance. Adrenals/Urinary Tract: No masses identified. Right upper pole renal cyst is stable. No evidence of ureteral calculi or hydronephrosis. Unremarkable unopacified urinary bladder. Stomach/Bowel: No evidence of obstruction, inflammatory process or abnormal fluid collections. Normal appendix visualized. Vascular/Lymphatic: Shotty sub-cm bilateral iliac lymph nodes show no significant change. No pathologically  enlarged lymph nodes. No abdominal aortic aneurysm. Aortic atherosclerosis. Reproductive:  No mass or other significant abnormality. Other:  None. Musculoskeletal:  No suspicious bone lesions identified. IMPRESSION: 1. New mild diffuse biliary ductal dilatation. 2. No significant change in mild acute pancreatitis. 3. Stable 4.5 cm cystic lesion in pancreatic head, most likely representing a pseudocyst. Electronically Signed   By: Danae Orleans M.D.   On: 11/29/2018 19:43   Ct Abdomen Pelvis W Contrast  Result Date: 11/11/2018 CLINICAL DATA:  Acute generalized abdominal pain. EXAM: CT ABDOMEN AND PELVIS WITH CONTRAST TECHNIQUE: Multidetector CT imaging of the abdomen and pelvis was performed using the standard protocol following bolus administration of intravenous contrast. CONTRAST:  ISOVUE-300 IOPAMIDOL (ISOVUE-300) INJECTION 61% COMPARISON:  None. FINDINGS: Lower chest: No acute abnormality. Hepatobiliary: No focal liver abnormality is seen. No gallstones, gallbladder wall thickening, or biliary dilatation. Pancreas: Mild inflammatory changes are noted around the pancreas suggesting acute pancreatitis. 4.5 cm fluid collection is noted in the pancreatic head suggesting pseudocyst formation. No definite ductal dilatation is noted. Spleen: Normal in size without focal abnormality. Adrenals/Urinary Tract: Adrenal glands appear normal. Simple exophytic cyst is seen arising from upper pole of right kidney. No hydronephrosis or renal obstruction is noted. No renal or ureteral calculi are noted. Urinary bladder is unremarkable. Stomach/Bowel: Stomach is within normal limits. Appendix appears normal. No evidence of bowel wall thickening, distention, or inflammatory changes. Vascular/Lymphatic: Aortic atherosclerosis. No enlarged abdominal or pelvic lymph nodes. Reproductive: Prostate is unremarkable. Other: No abdominal wall hernia or abnormality. No abdominopelvic ascites. Musculoskeletal: No acute or significant  osseous findings. IMPRESSION: Findings consistent with acute pancreatitis. 4.5 cm fluid collection is noted in the pancreatic head most consistent with pseudocyst. Aortic Atherosclerosis (ICD10-I70.0). Electronically Signed   By: Lupita Raider M.D.   On: 11/11/2018 12:08   Mr Abdomen Mrcp Wo Contrast  Result Date: 11/30/2018 CLINICAL DATA:  Abdominal pain. Nausea and vomiting. Jaundice. Acute pancreatitis with pseudocyst. EXAM: MRI ABDOMEN WITHOUT CONTRAST  (INCLUDING MRCP) TECHNIQUE: Multiplanar multisequence MR imaging of the abdomen was performed. Heavily T2-weighted images of the biliary and pancreatic ducts were obtained, and three-dimensional MRCP images were rendered by post processing. COMPARISON:  CT on 11/29/2018 FINDINGS: Lower chest: No acute findings. Hepatobiliary: No masses visualized on this unenhanced exam. Gallbladder is unremarkable. Mild diffuse biliary ductal dilatation is again seen, with common bile duct measuring approximately 8 mm. No evidence of choledocholithiasis. Smoothly tapered stricture of the distal common bile duct is seen adjacent to cystic lesion in the pancreatic head. Pancreas: Mild acute pancreatitis is stable in appearance since previous study. A well-circumscribed cystic lesion is again seen in the pancreatic head, measuring 4.5 x 4.1 cm, also stable. This is most consistent with a pseudocyst. Beaded appearance of the main pancreatic duct is noted, which is suspicious for chronic pancreatitis. Spleen:  Within normal limits in size. Adrenals/Urinary tract: Stable approximately 5 cm benign-appearing cyst in upper pole of right kidney. No evidence of hydronephrosis. Stomach/Bowel: Visualized portion unremarkable. Vascular/Lymphatic: No pathologically  enlarged lymph nodes identified. No evidence of abdominal aortic aneurysm. Other:  None. Musculoskeletal:  No suspicious bone lesions identified. IMPRESSION: 1. Stable mild acute pancreatitis, superimposed on changes of  chronic pancreatitis. 2. Stable 4.5 cm cystic lesion in pancreatic head, most consistent with pseudocyst. 3. Stable mild biliary ductal dilatation, with smoothly tapered stricture in the distal common bile duct adjacent to cystic pancreatic lesion. No evidence of choledocholithiasis. Electronically Signed   By: Danae Orleans M.D.   On: 11/30/2018 16:47   Mr 3d Recon At Scanner  Result Date: 11/30/2018 CLINICAL DATA:  Abdominal pain. Nausea and vomiting. Jaundice. Acute pancreatitis with pseudocyst. EXAM: MRI ABDOMEN WITHOUT CONTRAST  (INCLUDING MRCP) TECHNIQUE: Multiplanar multisequence MR imaging of the abdomen was performed. Heavily T2-weighted images of the biliary and pancreatic ducts were obtained, and three-dimensional MRCP images were rendered by post processing. COMPARISON:  CT on 11/29/2018 FINDINGS: Lower chest: No acute findings. Hepatobiliary: No masses visualized on this unenhanced exam. Gallbladder is unremarkable. Mild diffuse biliary ductal dilatation is again seen, with common bile duct measuring approximately 8 mm. No evidence of choledocholithiasis. Smoothly tapered stricture of the distal common bile duct is seen adjacent to cystic lesion in the pancreatic head. Pancreas: Mild acute pancreatitis is stable in appearance since previous study. A well-circumscribed cystic lesion is again seen in the pancreatic head, measuring 4.5 x 4.1 cm, also stable. This is most consistent with a pseudocyst. Beaded appearance of the main pancreatic duct is noted, which is suspicious for chronic pancreatitis. Spleen:  Within normal limits in size. Adrenals/Urinary tract: Stable approximately 5 cm benign-appearing cyst in upper pole of right kidney. No evidence of hydronephrosis. Stomach/Bowel: Visualized portion unremarkable. Vascular/Lymphatic: No pathologically enlarged lymph nodes identified. No evidence of abdominal aortic aneurysm. Other:  None. Musculoskeletal:  No suspicious bone lesions identified.  IMPRESSION: 1. Stable mild acute pancreatitis, superimposed on changes of chronic pancreatitis. 2. Stable 4.5 cm cystic lesion in pancreatic head, most consistent with pseudocyst. 3. Stable mild biliary ductal dilatation, with smoothly tapered stricture in the distal common bile duct adjacent to cystic pancreatic lesion. No evidence of choledocholithiasis. Electronically Signed   By: Danae Orleans M.D.   On: 11/30/2018 16:47   Dg Ercp Biliary & Pancreatic Ducts  Result Date: 12/03/2018 CLINICAL DATA:  Pancreatitis.  Abnormal LFTs. EXAM: ERCP TECHNIQUE: Multiple spot images obtained with the fluoroscopic device and submitted for interpretation post-procedure. FLUOROSCOPY TIME:  52 seconds COMPARISON:  None. FINDINGS: Three spot fluoroscopic images of the right upper abdominal quadrant during ERCP are provided for review Initial image demonstrates an ERCP probe overlying the right upper abdominal quadrant. There is selective cannulation opacification of the mid and central aspects of the CBD and minimal opacification of the central aspect of the intrahepatic biliary tree all of which appear mildly dilated. There is minimal opacification the cystic duct. No discrete persistent intraluminal filling defects are seen suggest the presence of choledocholithiasis. Completion image demonstrates placement of an internal biliary stent overlying the expected location of the CBD. IMPRESSION: ERCP with biliary stent placement. These images were submitted for radiologic interpretation only. Please see the procedural report for the amount of contrast and the fluoroscopy time utilized. Electronically Signed   By: Simonne Come M.D.   On: 12/03/2018 13:32   Dg Ercp Biliary & Pancreatic Ducts  Result Date: 12/01/2018 CLINICAL DATA:  58 year old male status post unsuccessful ERCP EXAM: ERCP TECHNIQUE: Multiple spot images obtained with the fluoroscopic device and submitted for interpretation post-procedure. FLUOROSCOPY TIME:  Fluoroscopy Time:  2 minutes 26 seconds COMPARISON:  None. FINDINGS: A single intra procedural saved image is submitted for review. The image demonstrates a flexible endoscope in the descending duodenum with wire cannulation of the pancreatic duct. IMPRESSION: Wire cannulation of the pancreatic duct. These images were submitted for radiologic interpretation only. Please see the procedural report for the amount of contrast and the fluoroscopy time utilized. Electronically Signed   By: Malachy MoanHeath  McCullough M.D.   On: 12/01/2018 19:46   Mr Abdomen Mrcp Vivien RossettiW Wo Contast  Result Date: 11/13/2018 CLINICAL DATA:  Inpatient. Acute pancreatitis, first episode, uncertain etiology. Follow-up cystic pancreatic lesion. EXAM: MRI ABDOMEN WITHOUT AND WITH CONTRAST (INCLUDING MRCP) TECHNIQUE: Multiplanar multisequence MR imaging of the abdomen was performed both before and after the administration of intravenous contrast. Heavily T2-weighted images of the biliary and pancreatic ducts were obtained, and three-dimensional MRCP images were rendered by post processing. CONTRAST:  10 cc Gadavist IV. COMPARISON:  11/11/2018 CT abdomen/pelvis. FINDINGS: Lower chest: No acute abnormality at the lung bases. Hepatobiliary: Normal liver size and configuration. Mild diffuse hepatic steatosis. There is a mildly T2 hyperintense 1.4 cm liver lesion in the posterior segment 2 left liver lobe (series 6/image 10), which demonstrates hyperenhancement on all post-contrast sequences, compatible with flash filling hemangioma. No additional liver lesions. Normal gallbladder with no cholelithiasis. Minimal intrahepatic biliary ductal dilatation. Common bile duct diameter 4 mm. No evidence of choledocholithiasis. Smooth extrinsic mass-effect on the intrapancreatic portion of the distal CBD. Pancreas: There is diffuse pancreatic parenchymal and peripancreatic edema, most prominent in the pancreatic head extending into the mesenteric root and minimally into the  anterior and posterior left paranephric spaces, compatible with acute pancreatitis. Pancreatic parenchymal enhancement is preserved. There is a 4.4 x 3.7 x 4.0 cm complex cystic lesion in the pancreatic head (series 4/image 25), which demonstrates mildly irregular contour and no internal enhancement or significant wall thickening, including absence of enhancement within the nodular T1 hyperintense T2 hypointense eccentric hemorrhagic/proteinaceous debris at the anterior inferior margin of the cyst (series 16/image 61), most compatible with pancreatic pseudocyst, which measured 4.5 x 3.8 x 4.0 cm on 11/11/2018 CT, not appreciably changed. No additional focal pancreatic lesions. No peripancreatic fluid collections. Difficult to assess for pancreas divisum given mass-effect on distal common bile duct and ventral and dorsal pancreatic ducts. No pancreatic duct dilation. Spleen: Normal size. No mass. Adrenals/Urinary Tract: Normal adrenals. No hydronephrosis. Simple exophytic 5.3 cm upper right renal cyst. No suspicious renal masses. Stomach/Bowel: Normal non-distended stomach. Visualized small and large bowel is normal caliber, with no bowel wall thickening. Vascular/Lymphatic: Atherosclerotic nonaneurysmal abdominal aorta. Patent portal, splenic, hepatic and renal veins. Stable extrinsic mass-effect on confluence of the SMV, main portal vein and splenic veins, which remain patent and without thrombosis. No pathologically enlarged lymph nodes in the abdomen. Other: No abdominal ascites or focal fluid collection. Musculoskeletal: No aggressive appearing focal osseous lesions. IMPRESSION: 1. Acute pancreatitis. Complex nonenhancing cystic lesion in the pancreatic head measures 4.4 x 3.7 x 4.0 cm and demonstrates eccentric nodular hemorrhagic/proteinaceous debris, most compatible with a pancreatic pseudocyst, stable in size since recent 11/11/2018 CT. Normal caliber common bile duct. Minimal intrahepatic biliary ductal  dilatation. No pancreatic duct dilation. Follow-up MRI abdomen without and with IV contrast recommended in 3 months. 2. Stable extrinsic mass-effect on the portosplenic venous confluence, without venous thrombosis. 3. Mild diffuse hepatic steatosis. 4. No cholelithiasis.  No choledocholithiasis. Electronically Signed   By: Delbert PhenixJason A Poff M.D.   On: 11/13/2018 19:05  [place] Abdomen Limited Ruq  Result Date: 11/12/2018 CLINICAL DATA:  58 year old male with abdominal pain and pancreatitis. EXAM: ULTRASOUND ABDOMEN LIMITED RIGHT UPPER QUADRANT COMPARISON:  11/11/2018 CT FINDINGS: Gallbladder: The gallbladder is unremarkable. There is no evidence of cholelithiasis or acute cholecystitis. Common bile duct: Diameter: 3.5 mm. No intrahepatic or extrahepatic biliary dilatation identified. Liver: No focal abnormalities are identified. UPPER limits of normal hepatic echogenicity may represent mild hepatic steatosis. Portal vein is patent on color Doppler imaging with normal direction of blood flow towards the liver. Other: None. IMPRESSION: 1. No biliary dilatation. 2. Unremarkable gallbladder. No evidence of cholelithiasis or acute cholecystitis. 3. Possible mild hepatic steatosis. Electronically Signed   By: Harmon PierJeffrey  Hu M.D.   On: 11/12/2018 14:47    Microbiology: Recent Results (from the past 240 hour(s))  SARS Coronavirus 2 [facility](Hospital order, Performed in [facility] hospital lab) Nasopharyngeal Nasopharyngeal Swab     Status: None   Collection Time: 11/29/18  5:27 PM   Specimen: Nasopharyngeal Swab  Result Value Ref Range Status   SARS Coronavirus 2 NEGATIVE NEGATIVE Final    Comment: (NOTE) If result is NEGATIVE SARS-CoV-2 target nucleic acids are NOT DETECTED. The SARS-CoV-2 RNA is generally detectable in upper and lower  respiratory specimens during the acute phase of infection. The lowest  concentration of SARS-CoV-2 viral copies this assay can detect is 250  copies / mL. A negative result does not  preclude SARS-CoV-2 infection  and should not be used as the sole basis for treatment or other  patient management decisions.  A negative result may occur with  improper specimen collection / handling, submission of specimen other  than nasopharyngeal swab, presence of viral mutation(s) within the  areas targeted by this assay, and inadequate number of viral copies  (<250 copies / mL). A negative result must be combined with clinical  observations, patient history, and epidemiological information. If result is POSITIVE SARS-CoV-2 target nucleic acids are DETECTED. The SARS-CoV-2 RNA is generally detectable in upper and lower  respiratory specimens dur ing the acute phase of infection.  Positive  results are indicative of active infection with SARS-CoV-2.  Clinical  correlation with patient history and other diagnostic information is  necessary to determine patient infection status.  Positive results do  not rule out bacterial infection or co-infection with other viruses. If result is PRESUMPTIVE POSTIVE SARS-CoV-2 nucleic acids MAY BE PRESENT.   A presumptive positive result was obtained on the submitted specimen  and confirmed on repeat testing.  While 2019 novel coronavirus  (SARS-CoV-2) nucleic acids may be present in the submitted sample  additional confirmatory testing may be necessary for epidemiological  and / or clinical management purposes  to differentiate between  SARS-CoV-2 and other Sarbecovirus currently known to infect humans.  If clinically indicated additional testing with an alternate test  methodology [phone]) is advised. The SARS-CoV-2 RNA is generally  detectable in upper and lower respiratory sp ecimens during the acute  phase of infection. The expected result is Negative. Fact Sheet for Patients:  BoilerBrush.com.cyhttps://www.fda.gov/media/136312/download Fact Sheet for Healthcare Providers: https://pope.com/https://www.fda.gov/media/136313/download This test is not yet approved or cleared by  the [place] FDA and has been authorized for detection and/or diagnosis of SARS-CoV-2 by FDA under an Emergency Use Authorization (EUA).  This EUA will remain in effect (meaning this test can be used) for the duration of the COVID-19 declaration under Section 564(b)(1) of the Act, 21 U.S.C. section 360bbb-3(b)(1), unless the authorization is terminated or revoked sooner. Performed at  [facility], [address], [place], [place] [address]   Culture, blood (Routine X 2) w Reflex to ID Panel     Status: None (Preliminary result)   Collection Time: 11/30/18  3:25 AM   Specimen: BLOOD RIGHT HAND  Result Value Ref Range Status   Specimen Description BLOOD RIGHT HAND  Final   Special Requests   Final    BOTTLES DRAWN AEROBIC AND ANAEROBIC Blood Culture results may not be optimal due to an inadequate volume of blood received in culture bottles   Culture   Final    NO GROWTH 4 DAYS Performed at [facility], [address] [place], [place] [address]    Report Status PENDING  Incomplete  Culture, blood (Routine X 2) w Reflex to ID Panel     Status: None (Preliminary result)   Collection Time: 11/30/18  3:33 AM   Specimen: BLOOD LEFT HAND  Result Value Ref Range Status   Specimen Description BLOOD LEFT HAND  Final   Special Requests   Final    BOTTLES DRAWN AEROBIC AND ANAEROBIC Blood Culture adequate volume   Culture   Final    NO GROWTH 4 DAYS Performed at [facility], [address], [place], [place] [address]    Report Status PENDING  Incomplete     Labs: Basic Metabolic Panel: Recent Labs  Lab 12/01/18 0126 12/02/18 0105 12/03/18 0307 12/04/18 0328 12/05/18 0517  NA 135 135 135 132* 139  K 3.5 3.9 4.9 5.4* 3.3*  CL 104 107 108 106 110  CO2 19* 17* 17* 15* 21*  GLUCOSE 97 90 72 152* 118*  BUN <5* 9 <5* 6 6  CREATININE 0.55* 0.63 0.62 0.56* 0.58*  CALCIUM 8.3* 8.2* 8.0* 8.4* 8.3*  MG  --   --  1.9 2.0 1.7  PHOS  --   --  3.3 3.1 3.8   Liver  Function Tests: Recent Labs  Lab 12/01/18 0126 12/02/18 0105 12/03/18 0307 12/04/18 0328 12/05/18 0517  AST 85* 72* 86* 71* 51*  ALT 171* 131* 104* 95* 90*  ALKPHOS 231* 213* 242* 241* 189*  BILITOT 12.2* 12.1* 13.5* 6.8* 4.4*  PROT 6.4* 5.9* 5.6* 6.2* 6.0*  ALBUMIN 2.8* 2.4* 2.3* 2.6* 2.6*   Recent Labs  Lab 11/29/18 1514 12/01/18 0126 12/03/18 0307  LIPASE 288* 211* 84*   No results for input(s): AMMONIA in the last 168 hours. CBC: Recent Labs  Lab 11/29/18 1514  12/01/18 0126 12/02/18 0105 12/03/18 0307 12/04/18 0328 12/05/18 0517  WBC 14.2*   < > 11.5* 11.3* 9.2 7.7 10.1  NEUTROABS 9.9*  --   --   --  4.7 6.2 4.7  HGB 15.8   < > 13.9 12.8* 12.2* 13.4 12.3*  HCT 44.0   < > 39.2 38.1* 36.5* 38.1* 34.8*  MCV 92.1   < > 92.9 95.7 95.1 94.1 93.0  PLT 406*   < > 363 360 369 373 335   < > = values in this interval not displayed.   Cardiac Enzymes: No results for input(s): CKTOTAL, CKMB, CKMBINDEX, TROPONINI in the last 168 hours. BNP: BNP (last 3 results) No results for input(s): BNP in the last 8760 hours.  ProBNP (last 3 results) No results for input(s): PROBNP in the last 8760 hours.  CBG: Recent Labs  Lab 12/01/18 0801 12/01/18 1720 12/02/18 0827 12/03/18 0842 12/04/18 0741  GLUCAP 100* 93 77 89 156*       Signed:  Carolyne Littlesurtis Woods, MD [facility] [phone] pager

## 2018-12-22 ENCOUNTER — Other Ambulatory Visit: Payer: Self-pay | Admitting: Gastroenterology

## 2018-12-22 ENCOUNTER — Ambulatory Visit
Admission: RE | Admit: 2018-12-22 | Discharge: 2018-12-22 | Disposition: A | Discharge status: Home / Self Care | Payer: 59 | Source: Ambulatory Visit | Attending: Gastroenterology | Admitting: Gastroenterology

## 2018-12-22 ENCOUNTER — Other Ambulatory Visit: Payer: Self-pay

## 2018-12-22 DIAGNOSIS — K831 Obstruction of bile duct: Secondary | ICD-10-CM

## 2018-12-22 IMAGING — CR DG ABDOMEN 2V
2 series · 2 of 2 positions shown · non-contrast
Comparison: ERCP [DATE]

CLINICAL DATA: Biliary stricture

EXAM:
ABDOMEN - 2 VIEW

[w abdomen upright]
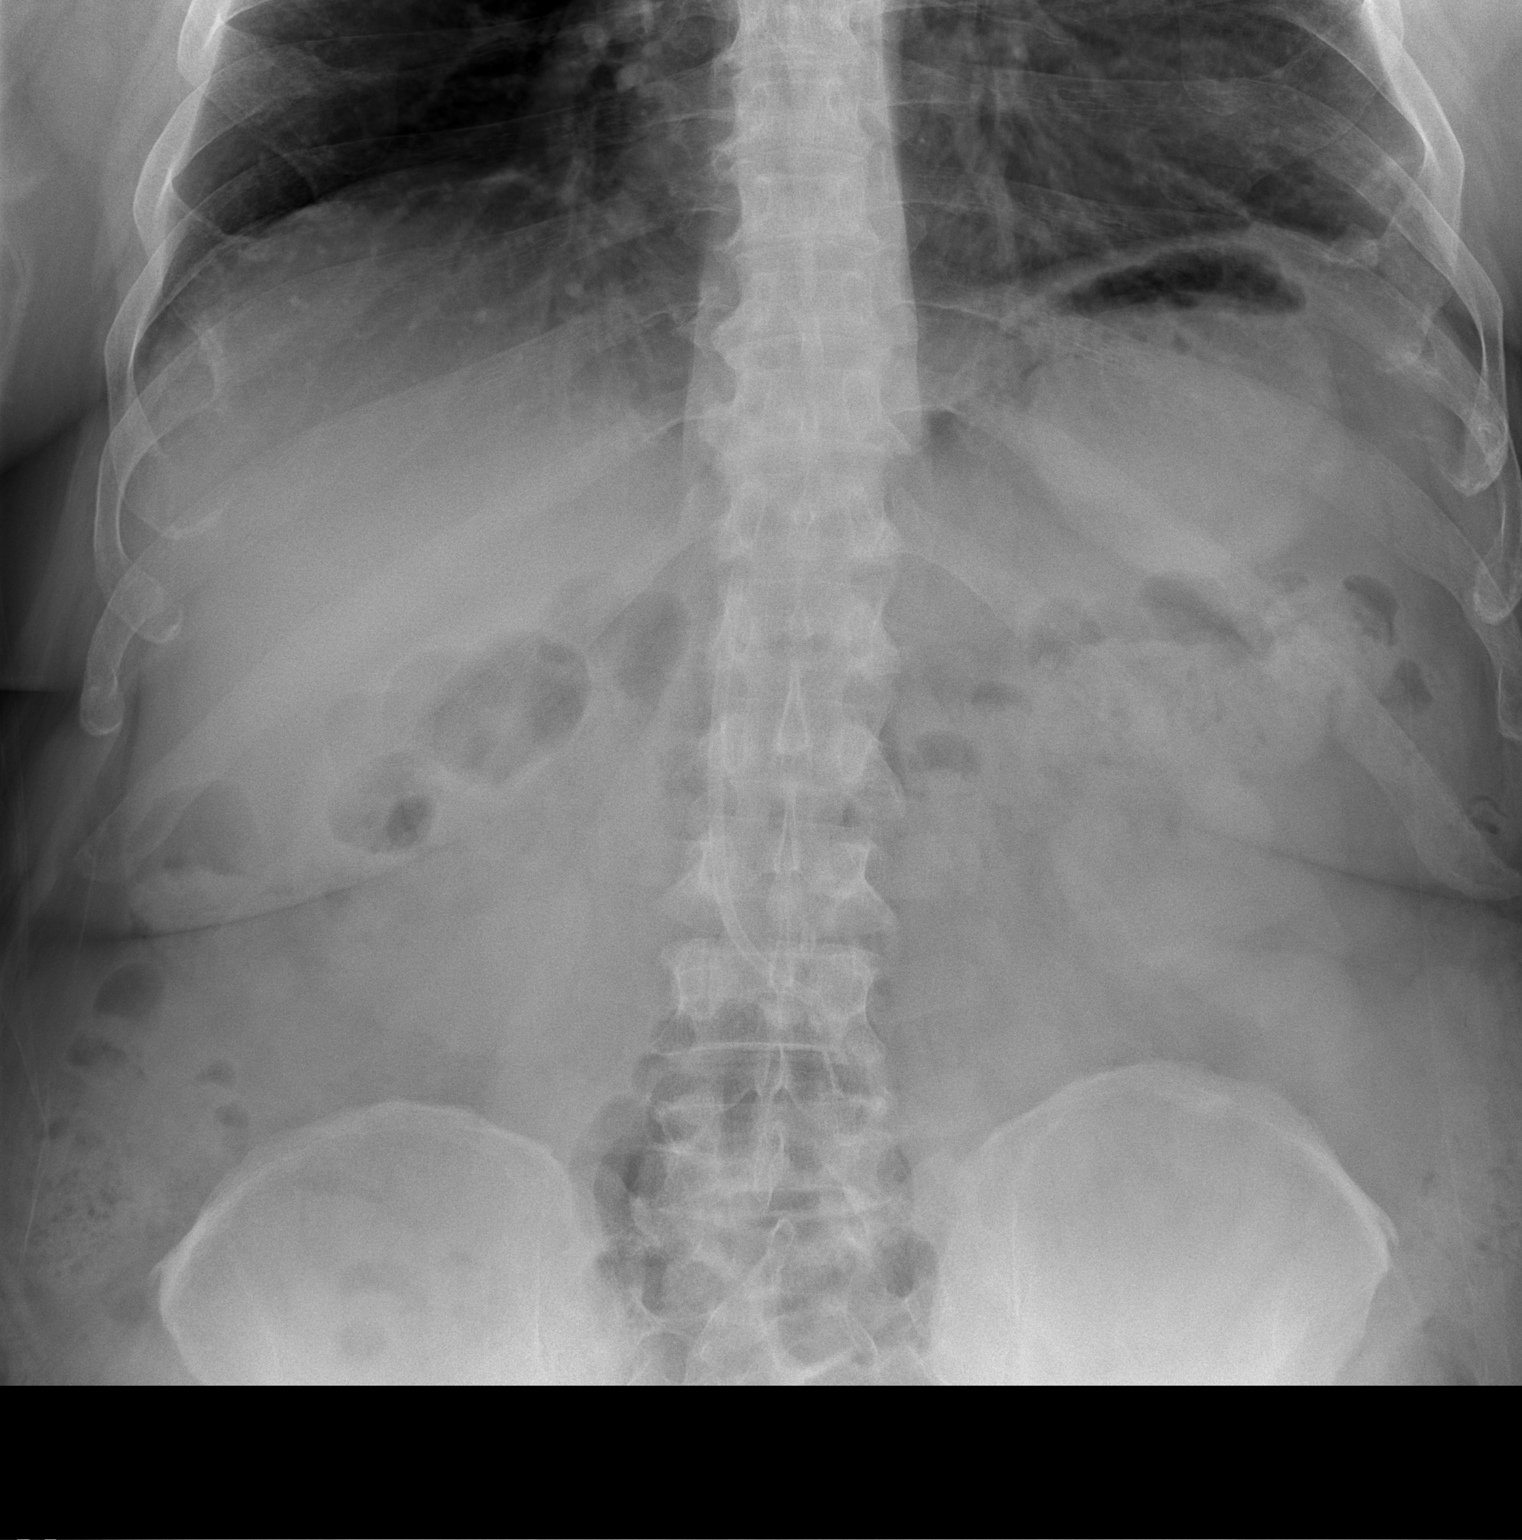

[t abdomen supine]
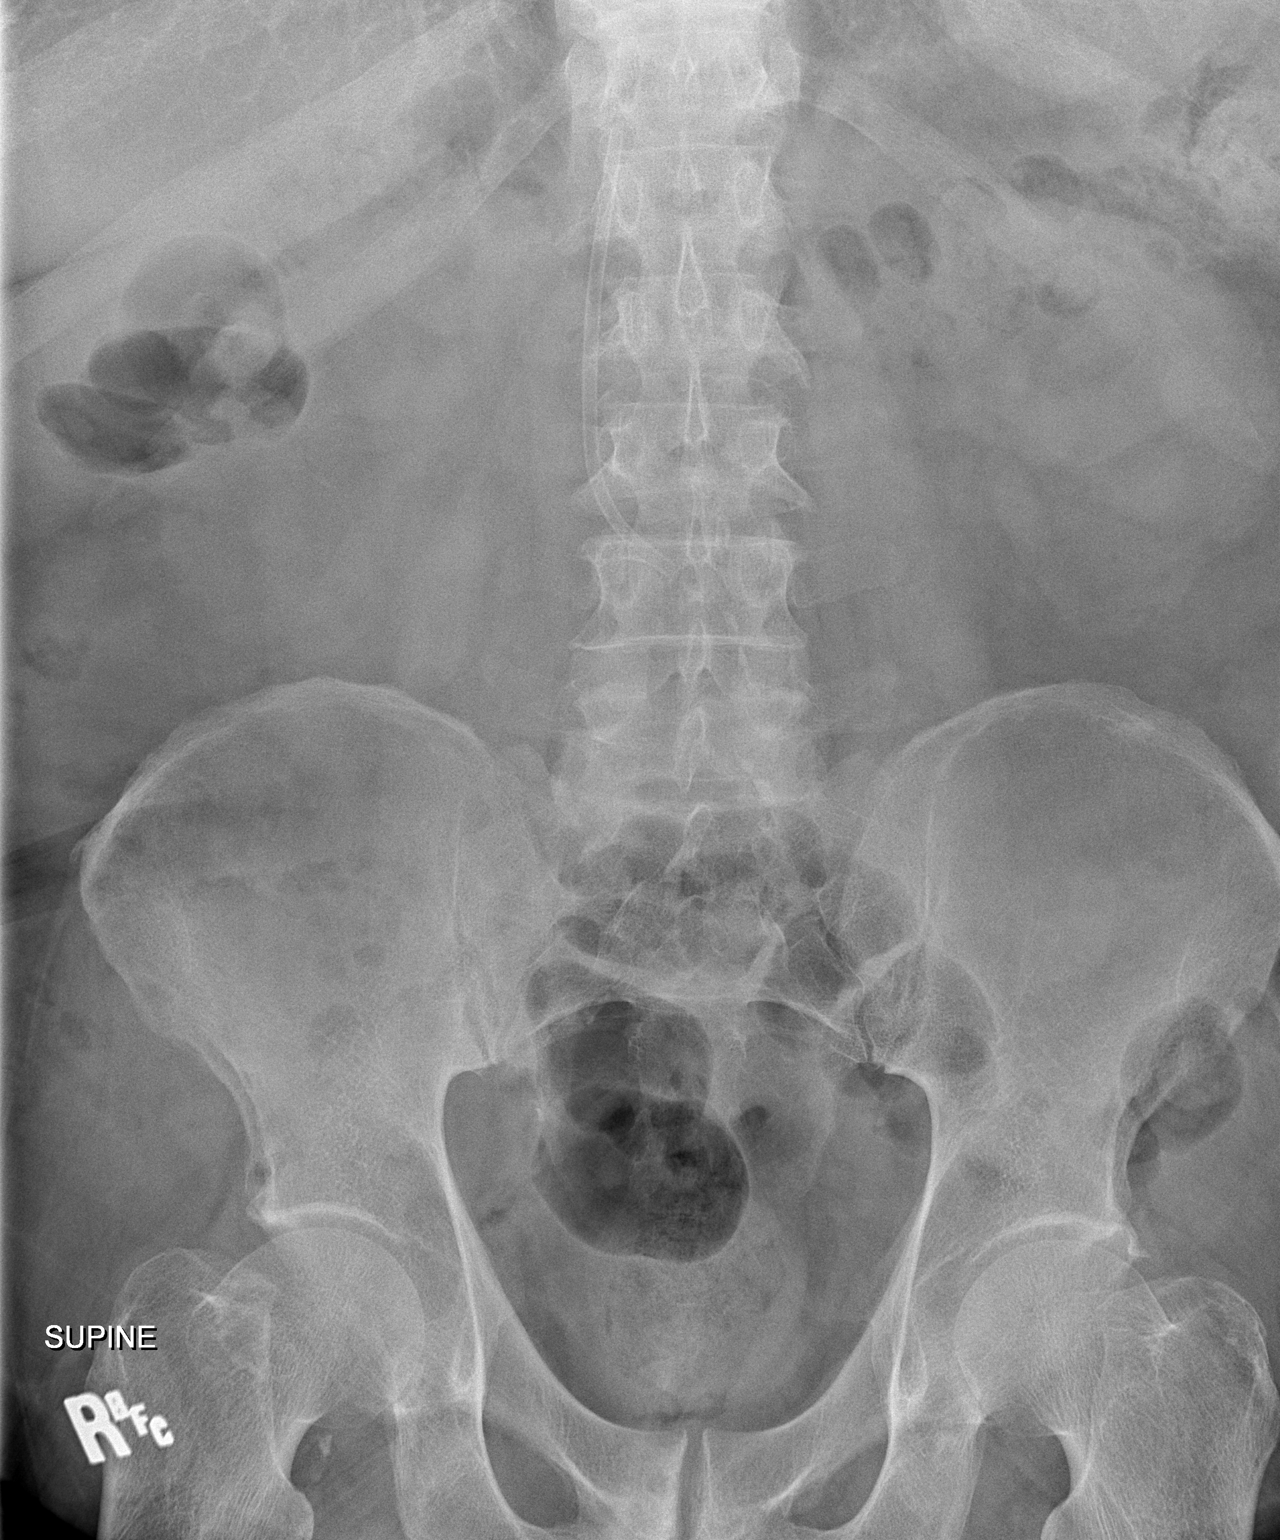

[2 of 2 positions shown; findings below may reference images not displayed]

FINDINGS: CBD stent identified projected over the RIGHT lateral aspect of the
upper lumbar spine.

This is concave to the LEFT, opposite from prior study,, question
within duodenum or within the distal CBD and duodenum.

Bowel gas pattern normal.

Osseous structures unremarkable.
IMPRESSION: CBD stent projects over the RIGHT lateral aspect of the lumbar
spine, curving in the opposite direction from the previous ERCP
images; this could be located within the second and proximal third
portions of the duodenum or within the distal CBD and descending
duodenum.

Clinical and laboratory correlation recommended; if there is
clinical concern for displacement of the stent in the duodenum,
consider CT to assess position.

## 2018-12-29 ENCOUNTER — Other Ambulatory Visit: Payer: Self-pay | Admitting: Gastroenterology

## 2018-12-29 ENCOUNTER — Ambulatory Visit
Admission: RE | Admit: 2018-12-29 | Discharge: 2018-12-29 | Disposition: A | Discharge status: Home / Self Care | Payer: 59 | Source: Ambulatory Visit | Attending: Gastroenterology | Admitting: Gastroenterology

## 2018-12-29 ENCOUNTER — Other Ambulatory Visit: Payer: Self-pay

## 2018-12-29 DIAGNOSIS — K831 Obstruction of bile duct: Secondary | ICD-10-CM

## 2019-02-13 ENCOUNTER — Other Ambulatory Visit: Payer: Self-pay

## 2019-02-13 ENCOUNTER — Ambulatory Visit
Admission: RE | Admit: 2019-02-13 | Discharge: 2019-02-13 | Disposition: A | Discharge status: Home / Self Care | Payer: 59 | Source: Ambulatory Visit | Attending: Family Medicine | Admitting: Family Medicine

## 2019-02-13 DIAGNOSIS — K8689 Other specified diseases of pancreas: Secondary | ICD-10-CM

## 2019-02-13 IMAGING — MR MR ABDOMEN WO/W CM
18 of 20 series · 44 of 48 positions shown · IV contrast (multihance)
Comparison: Abdominal MRI [DATE]. CT the abdomen and pelvis
[DATE].

CLINICAL DATA: 58-year-old male with history of pancreatic mass.
Follow-up study.

EXAM:
MRI ABDOMEN WITHOUT AND WITH CONTRAST
TECHNIQUE: Multiplanar multisequence MR imaging of the abdomen was performed
both before and after the administration of intravenous contrast.
CONTRAST:  20mL MULTIHANCE GADOBENATE DIMEGLUMINE 529 MG/ML IV SOLN

[Series 2: T2 · coronal · 5.0mm · 1.56mm/px · 2 of 36 slices shown (1 of 4)]
[im 1/36]
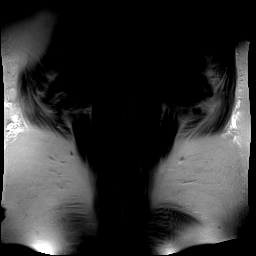
[im 36/36]
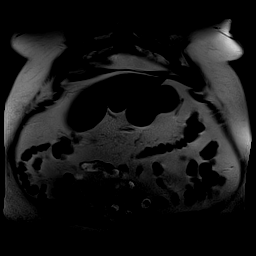

[Series 3: T1 · axial · 3.0mm · 1.25mm/px · z∈[-142,+71]mm · 5 of 144 slices shown]
[im 1/144]
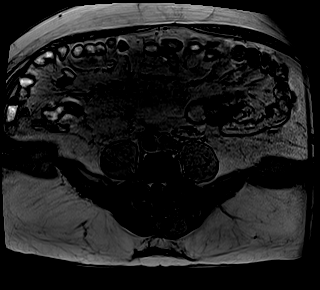
[im 36/144]
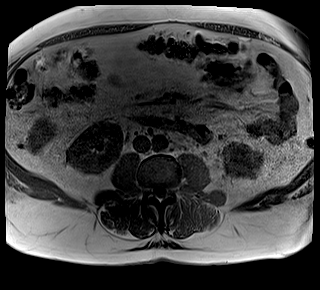
[im 72/144]
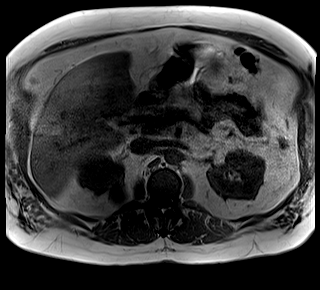
[im 108/144]
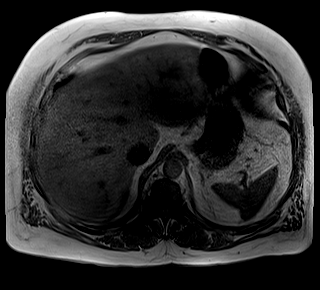
[im 144/144]
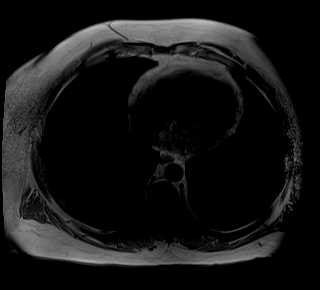

[Series 6: bSSFP · axial · 5.0mm · 1.25mm/px · 1 of 40 slices shown]
[im 1/40]
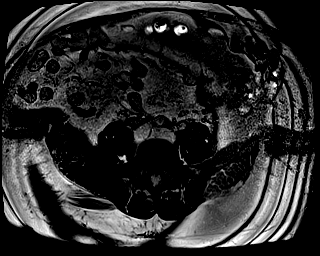

[Series 7: T2 · axial · 5.0mm · 1.56mm/px · 1 of 40 slices shown (2 of 4)]
[im 1/40]
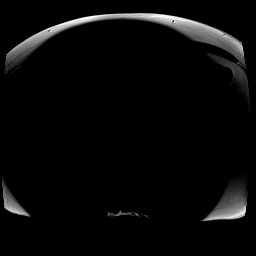

[Series 8: MRCP · coronal · 1.0mm · 0.47mm/px · 2 of 64 slices shown]
[im 1/64]
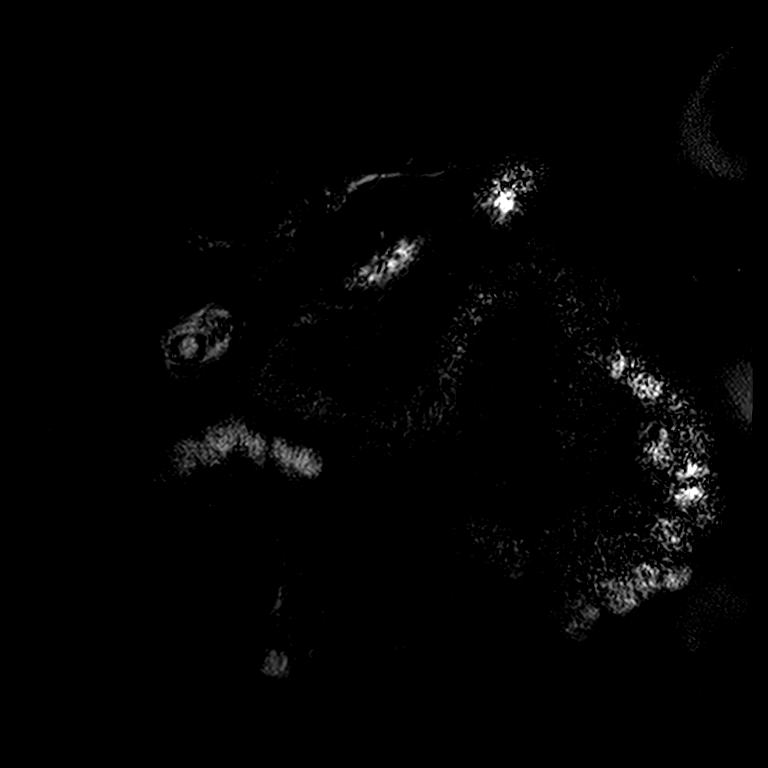
[im 64/64]
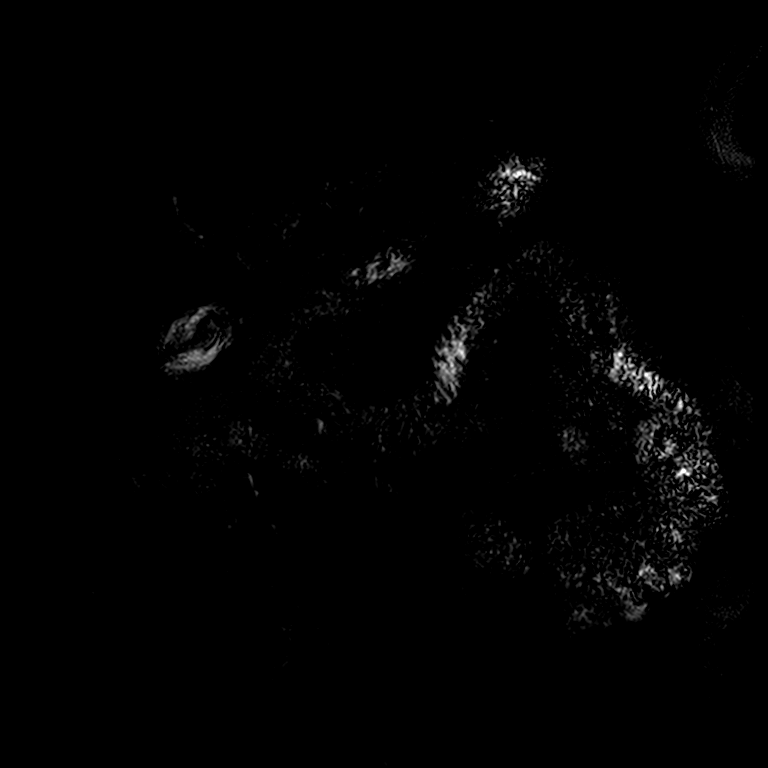

[Series 11: DWI · axial · 5.0mm · 1.49mm/px · z∈[-140,+70]mm · 4 of 108 slices shown (1 of 2)]
[im 1/108]
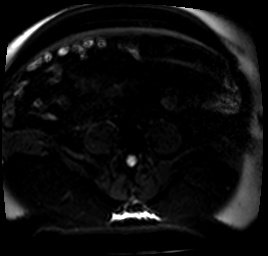
[im 36/108]
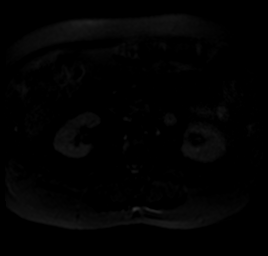
[im 72/108]
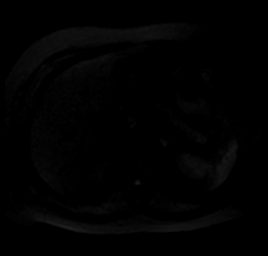
[im 108/108]
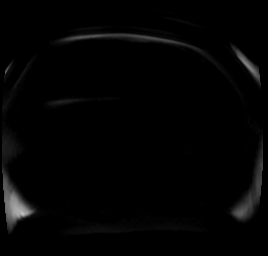

[Series 12: DWI · axial · 5.0mm · 1.49mm/px · 1 of 36 slices shown (2 of 2)]
[im 1/36]
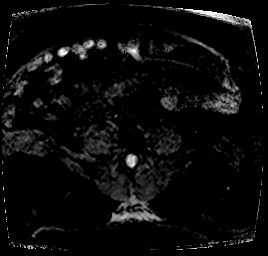

[Series 13: T2 · axial · 6.0mm · 1.25mm/px · 1 of 30 slices shown (3 of 4)]
[im 1/30]
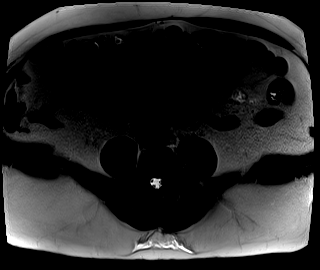

[Series 14: T2 · coronal · 3.0mm · 1.19mm/px · 1 of 19 slices shown (4 of 4)]
[im 1/19]
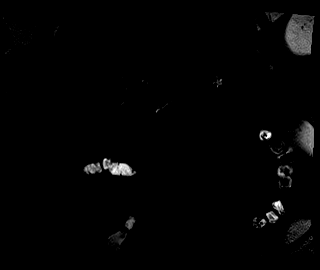

[Series 15: T1 dynamic · axial · non-contrast · 3.0mm · 1.25mm/px · z∈[-174,+87]mm · 3 of 88 slices shown]
[im 1/88]
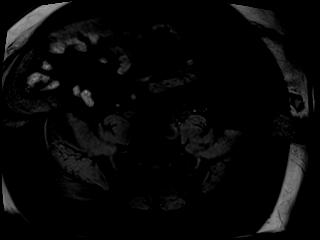
[im 44/88]
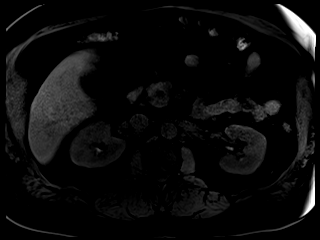
[im 88/88]
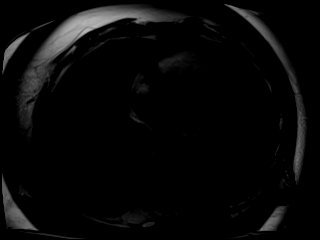

[Series 16: T1 dynamic post-contrast · axial · 3.0mm · 1.25mm/px · z∈[-174,+87]mm · 3 of 88 slices shown (1 of 8)]
[im 1/88]
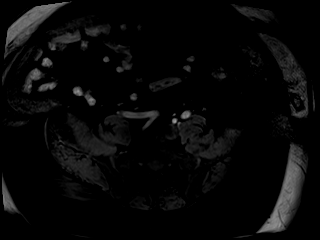
[im 44/88]
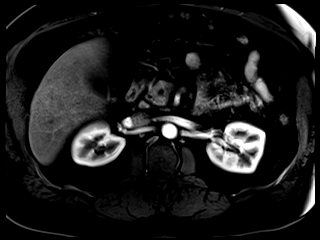
[im 88/88]
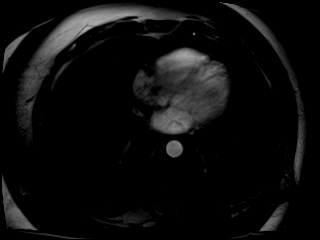

[Series 17: T1 dynamic post-contrast · axial · 3.0mm · 1.25mm/px · z∈[-174,+87]mm · 3 of 88 slices shown (2 of 8)]
[im 1/88]
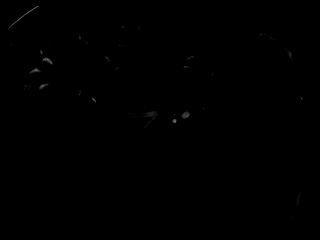
[im 44/88]
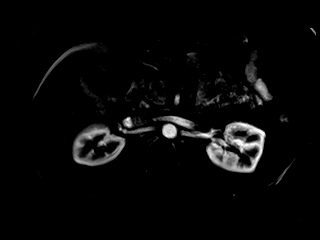
[im 88/88]
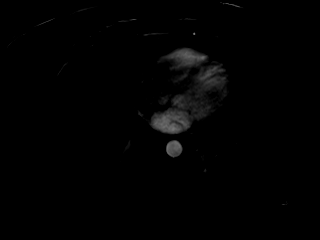

[Series 18: T1 dynamic post-contrast · axial · 3.0mm · 1.25mm/px · z∈[-174,+87]mm · 3 of 88 slices shown (3 of 8)]
[im 1/88]
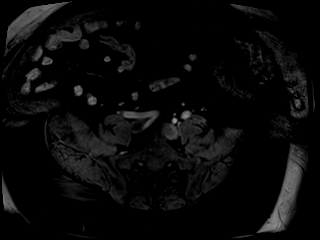
[im 44/88]
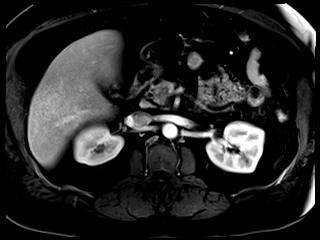
[im 88/88]
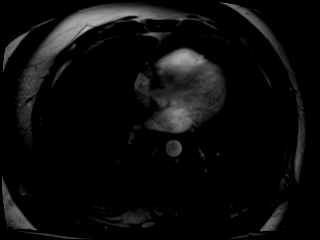

[Series 19: T1 dynamic post-contrast · axial · 3.0mm · 1.25mm/px · z∈[-174,+87]mm · 3 of 88 slices shown (4 of 8)]
[im 1/88]
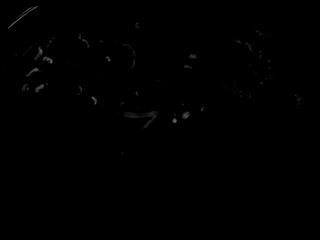
[im 44/88]
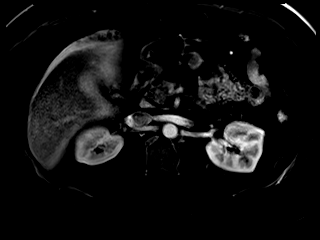
[im 88/88]
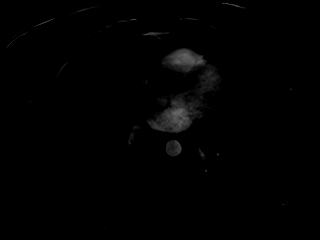

[Series 20: T1 dynamic post-contrast · axial · 3.0mm · 1.25mm/px · z∈[-174,+87]mm · 3 of 88 slices shown (5 of 8)]
[im 1/88]
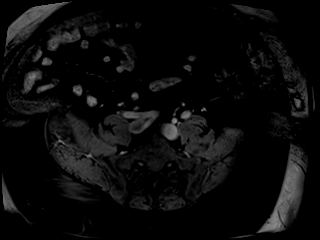
[im 44/88]
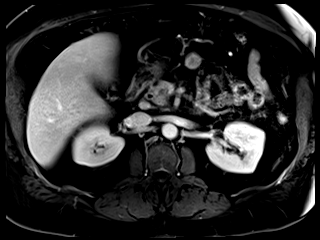
[im 88/88]
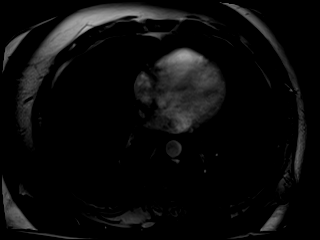

[Series 21: T1 dynamic post-contrast · axial · 3.0mm · 1.25mm/px · z∈[-174,+87]mm · 3 of 88 slices shown (6 of 8)]
[im 1/88]
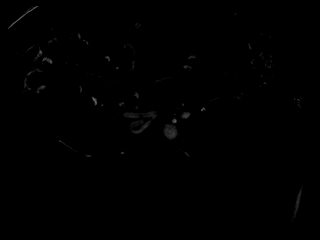
[im 44/88]
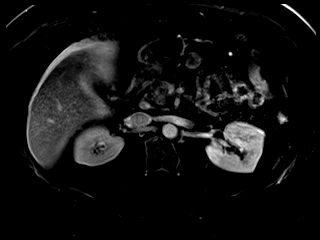
[im 88/88]
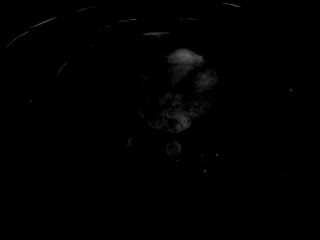

[Series 22: T1 dynamic post-contrast · coronal · 3.0mm · 1.25mm/px · 2 of 72 slices shown (7 of 8)]
[im 1/72]
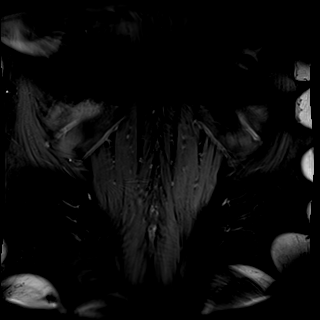
[im 72/72]
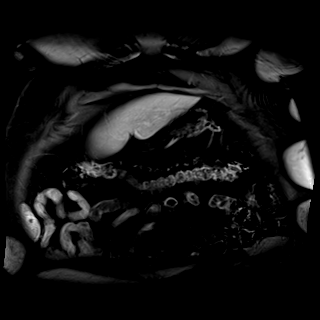

[Series 23: T1 dynamic post-contrast · axial · 3.0mm · 1.25mm/px · z∈[-174,+87]mm · 3 of 88 slices shown (8 of 8)]
[im 1/88]
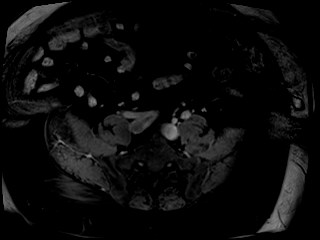
[im 44/88]
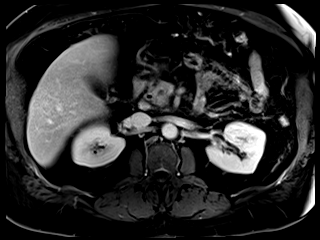
[im 88/88]
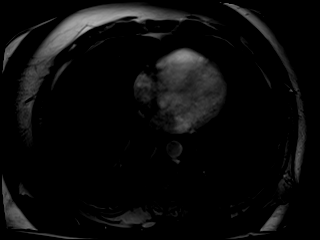

[44 of 48 positions shown; findings below may reference images not displayed]

FINDINGS: Lower chest: Unremarkable.

Hepatobiliary: Diffuse loss of signal intensity throughout the
hepatic parenchyma on out of phase dual echo images, indicative of a
background of hepatic steatosis. No suspicious cystic or solid
hepatic lesions. No intra or extrahepatic biliary ductal dilatation
noted on MRCP images. Gallbladder is normal in appearance. No
filling defects in the common bile duct to suggest
choledocholithiasis. Common bile duct measures 3 mm in the porta
hepatis.

Pancreas: Previously demonstrated hypovascular area in the head of
the pancreas has decreased in size, and again demonstrates T1
hypointensity and T2 hyperintensity, currently measuring 1.1 x 1.1 x
1.3 cm (axial image 49 of series 18 and coronal image 46 of series
22), most compatible with a resolving pancreatic pseudocyst. No new
pancreatic mass. No pancreatic ductal dilatation noted on MRCP
images. No peripancreatic fluid collections or inflammatory changes.

Spleen:  Unremarkable.

Adrenals/Urinary Tract: Subcentimeter T1 hypointense, T2
hyperintense lesions in the right kidney are compatible with tiny
simple cysts. Additional exophytic 4.7 cm simple cyst in the upper
pole the right kidney. Left kidney and bilateral adrenal glands are
normal in appearance. No hydroureteronephrosis in the visualized
portions of the abdomen.

Stomach/Bowel: Visualized portions are unremarkable.

Vascular/Lymphatic: Aortic atherosclerosis, without evidence of
aneurysm in the abdominal vasculature. No lymphadenopathy noted in
the abdomen.

Other: No significant volume of ascites noted in the visualized
portions of the peritoneal cavity.

Musculoskeletal: No aggressive appearing osseous lesions are noted
in the visualized portions of the skeleton.
IMPRESSION: 1. Resolving pancreatic pseudocyst in the pancreatic head, as above.
No suspicious pancreatic mass noted.
2. Hepatic steatosis.

## 2019-02-13 MED ORDER — GADOBENATE DIMEGLUMINE 529 MG/ML IV SOLN
20.0000 mL | Freq: Once | INTRAVENOUS | Status: AC | PRN
  Administered 2019-02-13: 20 mL via INTRAVENOUS

## 2019-04-08 ENCOUNTER — Other Ambulatory Visit: Payer: Self-pay | Admitting: Gastroenterology

## 2019-04-11 ENCOUNTER — Other Ambulatory Visit (HOSPITAL_COMMUNITY)
Admission: RE | Admit: 2019-04-11 | Discharge: 2019-04-11 | Disposition: A | Discharge status: Home / Self Care | Payer: Managed Care, Other (non HMO) | Source: Ambulatory Visit | Attending: Gastroenterology | Admitting: Gastroenterology

## 2019-04-11 DIAGNOSIS — Z01812 Encounter for preprocedural laboratory examination: Secondary | ICD-10-CM | POA: Insufficient documentation

## 2019-04-11 DIAGNOSIS — Z20822 Contact with and (suspected) exposure to covid-19: Secondary | ICD-10-CM | POA: Diagnosis not present

## 2019-04-11 LAB — SARS CORONAVIRUS 2 (TAT 6-24 HRS): SARS Coronavirus 2: NEGATIVE

## 2019-04-13 NOTE — Progress Notes (Addendum)
preop completed, answered questions regarding procedure, patient arrival time 1100am.

## 2019-04-14 ENCOUNTER — Encounter (HOSPITAL_COMMUNITY)
Admission: RE | Disposition: A | Discharge status: Home / Self Care | Payer: Self-pay | Source: Home / Self Care | Attending: Gastroenterology

## 2019-04-14 ENCOUNTER — Ambulatory Visit (HOSPITAL_COMMUNITY): Payer: Managed Care, Other (non HMO) | Admitting: Certified Registered Nurse Anesthetist

## 2019-04-14 ENCOUNTER — Other Ambulatory Visit: Payer: Self-pay

## 2019-04-14 ENCOUNTER — Encounter (HOSPITAL_COMMUNITY): Payer: Self-pay | Admitting: Gastroenterology

## 2019-04-14 ENCOUNTER — Ambulatory Visit (HOSPITAL_COMMUNITY)
Admission: RE | Admit: 2019-04-14 | Discharge: 2019-04-14 | Disposition: A | Discharge status: Home / Self Care | Payer: Managed Care, Other (non HMO) | Attending: Gastroenterology | Admitting: Gastroenterology

## 2019-04-14 DIAGNOSIS — G4733 Obstructive sleep apnea (adult) (pediatric): Secondary | ICD-10-CM | POA: Insufficient documentation

## 2019-04-14 DIAGNOSIS — Z4659 Encounter for fitting and adjustment of other gastrointestinal appliance and device: Secondary | ICD-10-CM | POA: Insufficient documentation

## 2019-04-14 DIAGNOSIS — Z7982 Long term (current) use of aspirin: Secondary | ICD-10-CM | POA: Insufficient documentation

## 2019-04-14 DIAGNOSIS — Z79899 Other long term (current) drug therapy: Secondary | ICD-10-CM | POA: Diagnosis not present

## 2019-04-14 DIAGNOSIS — I10 Essential (primary) hypertension: Secondary | ICD-10-CM | POA: Diagnosis not present

## 2019-04-14 DIAGNOSIS — K863 Pseudocyst of pancreas: Secondary | ICD-10-CM | POA: Insufficient documentation

## 2019-04-14 DIAGNOSIS — Z4689 Encounter for fitting and adjustment of other specified devices: Secondary | ICD-10-CM

## 2019-04-14 DIAGNOSIS — Z6841 Body Mass Index (BMI) 40.0 and over, adult: Secondary | ICD-10-CM | POA: Diagnosis not present

## 2019-04-14 DIAGNOSIS — Z800 Family history of malignant neoplasm of digestive organs: Secondary | ICD-10-CM | POA: Diagnosis not present

## 2019-04-14 DIAGNOSIS — F17200 Nicotine dependence, unspecified, uncomplicated: Secondary | ICD-10-CM | POA: Insufficient documentation

## 2019-04-14 HISTORY — PX: ESOPHAGOGASTRODUODENOSCOPY (EGD) WITH PROPOFOL: SHX5813

## 2019-04-14 HISTORY — PX: STENT REMOVAL: SHX6421

## 2019-04-14 SURGERY — ESOPHAGOGASTRODUODENOSCOPY (EGD) WITH PROPOFOL
Anesthesia: General

## 2019-04-14 MED ORDER — MIDAZOLAM HCL 5 MG/5ML IJ SOLN
INTRAMUSCULAR | Status: DC | PRN
  Administered 2019-04-14 (×2): 1 mg via INTRAVENOUS

## 2019-04-14 MED ORDER — PROPOFOL 500 MG/50ML IV EMUL
INTRAVENOUS | Status: DC | PRN
  Administered 2019-04-14: 30 mg via INTRAVENOUS

## 2019-04-14 MED ORDER — MIDAZOLAM HCL 2 MG/2ML IJ SOLN
INTRAMUSCULAR | Status: AC
  Filled 2019-04-14: qty 2

## 2019-04-14 MED ORDER — GLUCAGON HCL RDNA (DIAGNOSTIC) 1 MG IJ SOLR
INTRAMUSCULAR | Status: AC
  Filled 2019-04-14: qty 1

## 2019-04-14 MED ORDER — PROPOFOL 10 MG/ML IV BOLUS
INTRAVENOUS | Status: AC
  Filled 2019-04-14: qty 40

## 2019-04-14 MED ORDER — LACTATED RINGERS IV SOLN
INTRAVENOUS | Status: DC
  Administered 2019-04-14: 1000 mL via INTRAVENOUS

## 2019-04-14 MED ORDER — CIPROFLOXACIN IN D5W 400 MG/200ML IV SOLN
INTRAVENOUS | Status: AC
  Filled 2019-04-14: qty 200

## 2019-04-14 MED ORDER — PROPOFOL 500 MG/50ML IV EMUL
INTRAVENOUS | Status: DC | PRN
  Administered 2019-04-14: 75 ug/kg/min via INTRAVENOUS

## 2019-04-14 MED ORDER — SODIUM CHLORIDE 0.9 % IV SOLN: INTRAVENOUS | Status: DC

## 2019-04-14 MED ORDER — INDOMETHACIN 50 MG RE SUPP
RECTAL | Status: AC
  Filled 2019-04-14: qty 2

## 2019-04-14 SURGICAL SUPPLY — 14 items

## 2019-04-14 NOTE — Anesthesia Preprocedure Evaluation (Addendum)
Anesthesia Evaluation  Patient identified by MRN, date of birth, ID band Patient awake    Reviewed: Allergy & Precautions, NPO status , Patient's Chart, lab work & pertinent test results  Airway Mallampati: IV  TM Distance: >3 FB Neck ROM: Full    Dental no notable dental hx. (+) Teeth Intact   Pulmonary sleep apnea and Continuous Positive Airway Pressure Ventilation , Current Smoker and Patient abstained from smoking.,    Pulmonary exam normal breath sounds clear to auscultation       Cardiovascular Exercise Tolerance: Good hypertension, Pt. on medications Normal cardiovascular exam Rhythm:Regular Rate:Normal     Neuro/Psych negative neurological ROS  negative psych ROS   GI/Hepatic negative GI ROS, Neg liver ROS,   Endo/Other  negative endocrine ROS  Renal/GU negative Renal ROS     Musculoskeletal   Abdominal   Peds  Hematology negative hematology ROS (+)   Anesthesia Other Findings   Reproductive/Obstetrics                                                              Anesthesia Physical  Anesthesia Plan  ASA: III  Anesthesia Plan: MAC   Post-op Pain Management:    Induction: Intravenous and Rapid sequence  PONV Risk Score and Plan: 3 and Treatment may vary due to age or medical condition, Ondansetron, Dexamethasone and Midazolam  Airway Management Planned: Natural Airway and Nasal Cannula  Additional Equipment:   Intra-op Plan:   Post-operative Plan:   Informed Consent: I have reviewed the patients History and Physical, chart, labs and discussed the procedure including the risks, benefits and alternatives for the proposed anesthesia with the patient or authorized representative who has indicated his/her understanding and acceptance.     Dental advisory given  Plan Discussed with:   Anesthesia Plan Comments:        Anesthesia Quick Evaluation

## 2019-04-14 NOTE — Transfer of Care (Signed)
Immediate Anesthesia Transfer of Care Note  Patient: Philip Rhodes  Procedure(s) Performed: ESOPHAGOGASTRODUODENOSCOPY (EGD) WITH PROPOFOL (N/A ) STENT REMOVAL  Patient Location: PACU  Anesthesia Type:MAC  Level of Consciousness: awake, alert  and oriented  Airway & Oxygen Therapy: Patient Spontanous Breathing and Patient connected to face mask oxygen  Post-op Assessment: Report given to RN and Post -op Vital signs reviewed and stable  Post vital signs: Reviewed and stable  Last Vitals:  Vitals Value Taken Time  BP    Temp    Pulse    Resp    SpO2      Last Pain:  Vitals:   04/14/19 1125  TempSrc: Oral  PainSc: 0-No pain         Complications: No apparent anesthesia complications

## 2019-04-14 NOTE — Anesthesia Postprocedure Evaluation (Addendum)
Anesthesia Post Note  Patient: Philip Rhodes  Procedure(s) Performed: ESOPHAGOGASTRODUODENOSCOPY (EGD) WITH PROPOFOL (N/A ) STENT REMOVAL     Patient location during evaluation: PACU Anesthesia Type: MAC Level of consciousness: awake and alert Pain management: pain level controlled Vital Signs Assessment: post-procedure vital signs reviewed and stable Respiratory status: spontaneous breathing, nonlabored ventilation, respiratory function stable and patient connected to nasal cannula oxygen Cardiovascular status: blood pressure returned to baseline and stable Postop Assessment: no apparent nausea or vomiting Anesthetic complications: no    Last Vitals:  Vitals:   04/14/19 1310 04/14/19 1320  BP: 121/72 109/63  Pulse: 64 (!) 53  Resp: 18 16  Temp:    SpO2: 98% 98%    Last Pain:  Vitals:   04/14/19 1320  TempSrc:   PainSc: 0-No pain                 Tiajuana Amass

## 2019-04-14 NOTE — Progress Notes (Signed)
Eual Fines 12:32 PM  Subjective: Patient is doing well without any GI complaints and no problems since a recent tele visit in our office  Objective: Vital signs stable afebrile no acute distress exam please see preassessment evaluation  Assessment: Biliary stent requiring removal  Plan: Okay to proceed with anesthesia assistance today  [facility]  office [phone] After 5PM or if no answer call [phone]

## 2019-04-14 NOTE — Discharge Instructions (Signed)
YOU HAD AN ENDOSCOPIC PROCEDURE TODAY: Refer to the procedure report and other information in the discharge instructions given to you for any specific questions about what was found during the examination. If this information does not answer your questions, please call [facility] office at [phone] to clarify.   YOU SHOULD EXPECT: Some feelings of bloating in the abdomen. Passage of more gas than usual. Walking can help get rid of the air that was put into your GI tract during the procedure and reduce the bloating. If you had a lower endoscopy (such as a colonoscopy or flexible sigmoidoscopy) you may notice spotting of blood in your stool or on the toilet paper. Some abdominal soreness may be present for a day or two, also.  DIET: Your first meal following the procedure should be a light meal and then it is ok to progress to your normal diet. A half-sandwich or bowl of soup is an example of a good first meal. Heavy or fried foods are harder to digest and may make you feel nauseous or bloated. Drink plenty of fluids but you should avoid alcoholic beverages for 24 hours. If you had a esophageal dilation, please see attached instructions for diet.   ACTIVITY: Your care partner should take you home directly after the procedure. You should plan to take it easy, moving slowly for the rest of the day. You can resume normal activity the day after the procedure however YOU SHOULD NOT DRIVE, use power tools, machinery or perform tasks that involve climbing or major physical exertion for 24 hours (because of the sedation medicines used during the test).   SYMPTOMS TO REPORT IMMEDIATELY: A gastroenterologist can be reached at any hour. Please call [phone]  for any of the following symptoms:  . Following lower endoscopy (colonoscopy, flexible sigmoidoscopy) Excessive amounts of blood in the stool  Significant tenderness, worsening of abdominal pains  Swelling of the abdomen that is new, acute  Fever of 100  or higher  . Following upper endoscopy (EGD, EUS, ERCP, esophageal dilation) Vomiting of blood or coffee ground material  New, significant abdominal pain  New, significant chest pain or pain under the shoulder blades  Painful or persistently difficult swallowing  New shortness of breath  Black, tarry-looking or red, bloody stools  FOLLOW UP:  If any biopsies were taken you will be contacted by phone or by letter within the next 1-3 weeks. Call [phone]  if you have not heard about the biopsies in 3 weeks.  Please also call with any specific questions about appointments or follow up tests.Begin with liquid diet and if that goes well may advance to soft solids and call if question or problem particularly if recurrence of pain nausea vomiting yellow jaundice or other GI question or problem

## 2019-04-14 NOTE — Op Note (Signed)
[facility] Patient Name: Philip Rhodes Procedure Date: 04/14/2019 MRN: [id number] Attending MD: Vida Rigger , MD Date of Birth: 12/28/1960 CSN: [id number] Age: 59 Admit Type: Outpatient Procedure:                Upper GI endoscopy Indications:              Foreign body in the small bowel, i.e. biliary stent                            removal Providers:                Vida Rigger, MD, Dwain Sarna, RN, Glory Rosebush,                            RN, Kandice Robinsons, Technician Referring MD:              Medicines:                Propofol total dose 150 mg IV, Midazolam 2 mg IV Complications:            No immediate complications. Estimated Blood Loss:     Estimated blood loss: none. Procedure:                Pre-Anesthesia Assessment:                           - Prior to the procedure, a History and Physical                            was performed, and patient medications and                            allergies were reviewed. The patient's tolerance of                            previous anesthesia was also reviewed. The risks                            and benefits of the procedure and the sedation                            options and risks were discussed with the patient.                            All questions were answered, and informed consent                            was obtained. Prior Anticoagulants: The patient has                            taken no previous anticoagulant or antiplatelet                            agents. ASA Grade Assessment: II - A patient with  mild systemic disease. After reviewing the risks                            and benefits, the patient was deemed in                            satisfactory condition to undergo the procedure.                           After obtaining informed consent, the endoscope was                            passed under direct vision. Throughout the   procedure, the patient's blood pressure, pulse, and                            oxygen saturations were monitored continuously. The                            [id number]-Q190V ([id number]) Olympus duodenoscope was                            introduced through the mouth, and advanced to the                            second part of duodenum. The upper GI endoscopy was                            accomplished without difficulty. The patient                            tolerated the procedure well. Scope In: Scope Out: Findings:      The gastric antrum was normal. Since we used the side-viewing scope no       obvious abnormalities were seen but the mucosa was not completely       evaluated      A previously placed plastic stent was seen at the major papilla.       Unfortunately it was not in a good position for [place] to snare it despite a       short effort in trying and the stent removal was accomplished with a       Raptor grasping device which we fairly readily exchanged for the snare.      The exam was otherwise without abnormality. Impression:               - Normal antrum. Complete endoscopy not done                            because we use the side-viewing ERCP scope                           - Plastic stent in the duodenum. Removed.                           - The examination was otherwise normal. Moderate Sedation:  Not Applicable - Patient had care per Anesthesia. Recommendation:           - Patient has a contact number available for                            emergencies. The signs and symptoms of potential                            delayed complications were discussed with the                            patient. Return to normal activities tomorrow.                            Written discharge instructions were provided to the                            patient.                           - Clear liquid diet for 2 hours. Then soft solids                            the rest of the day if  okay                           - Continue present medications.                           - Return to GI clinic PRN.                           - Telephone GI clinic if symptomatic PRN. Procedure Code(s):        --- Professional ---                           [id number], Esophagogastroduodenoscopy, flexible,                            transoral; with removal of foreign body(s) Diagnosis Code(s):        --- Professional ---                           Z46.59, Encounter for fitting and adjustment of                            other gastrointestinal appliance and device                           T18.3XXA, Foreign body in small intestine, initial                            encounter CPT copyright 2019 [employer]. All rights reserved. The codes documented in this report are preliminary and upon coder review may  be revised to meet current compliance requirements. Vida Rigger, MD  04/14/2019 1:12:09 PM This report has been signed electronically. Number of Addenda: 0

## 2019-04-15 ENCOUNTER — Encounter: Payer: Self-pay | Admitting: *Deleted

## 2021-12-30 ENCOUNTER — Other Ambulatory Visit: Payer: Self-pay | Admitting: Family Medicine

## 2021-12-30 DIAGNOSIS — Z122 Encounter for screening for malignant neoplasm of respiratory organs: Secondary | ICD-10-CM

## 2022-02-03 ENCOUNTER — Ambulatory Visit
Admission: RE | Admit: 2022-02-03 | Discharge: 2022-02-03 | Disposition: A | Discharge status: Home / Self Care | Payer: 59 | Source: Ambulatory Visit | Attending: Family Medicine | Admitting: Family Medicine

## 2022-02-03 DIAGNOSIS — Z122 Encounter for screening for malignant neoplasm of respiratory organs: Secondary | ICD-10-CM

## 2022-12-26 DIAGNOSIS — G4733 Obstructive sleep apnea (adult) (pediatric): Secondary | ICD-10-CM | POA: Diagnosis not present

## 2023-01-13 ENCOUNTER — Other Ambulatory Visit: Payer: Self-pay | Admitting: Physician Assistant

## 2023-01-13 DIAGNOSIS — Z122 Encounter for screening for malignant neoplasm of respiratory organs: Secondary | ICD-10-CM

## 2023-01-13 DIAGNOSIS — I10 Essential (primary) hypertension: Secondary | ICD-10-CM | POA: Diagnosis not present

## 2023-01-13 DIAGNOSIS — Z125 Encounter for screening for malignant neoplasm of prostate: Secondary | ICD-10-CM | POA: Diagnosis not present

## 2023-01-13 DIAGNOSIS — E782 Mixed hyperlipidemia: Secondary | ICD-10-CM | POA: Diagnosis not present

## 2023-01-13 DIAGNOSIS — R7303 Prediabetes: Secondary | ICD-10-CM | POA: Diagnosis not present

## 2023-01-13 DIAGNOSIS — Z0000 Encounter for general adult medical examination without abnormal findings: Secondary | ICD-10-CM | POA: Diagnosis not present

## 2023-01-26 DIAGNOSIS — G4733 Obstructive sleep apnea (adult) (pediatric): Secondary | ICD-10-CM | POA: Diagnosis not present

## 2023-01-28 ENCOUNTER — Encounter: Payer: Self-pay | Admitting: Physician Assistant

## 2023-02-05 ENCOUNTER — Ambulatory Visit
Admission: RE | Admit: 2023-02-05 | Discharge: 2023-02-05 | Disposition: A | Discharge status: Home / Self Care | Payer: BC Managed Care – PPO | Source: Ambulatory Visit | Attending: Physician Assistant | Admitting: Physician Assistant

## 2023-02-05 DIAGNOSIS — Z122 Encounter for screening for malignant neoplasm of respiratory organs: Secondary | ICD-10-CM

## 2023-02-05 DIAGNOSIS — F17210 Nicotine dependence, cigarettes, uncomplicated: Secondary | ICD-10-CM | POA: Diagnosis not present

## 2023-02-24 DIAGNOSIS — D225 Melanocytic nevi of trunk: Secondary | ICD-10-CM | POA: Diagnosis not present

## 2023-02-24 DIAGNOSIS — D485 Neoplasm of uncertain behavior of skin: Secondary | ICD-10-CM | POA: Diagnosis not present

## 2023-02-24 DIAGNOSIS — Z1283 Encounter for screening for malignant neoplasm of skin: Secondary | ICD-10-CM | POA: Diagnosis not present

## 2023-02-25 DIAGNOSIS — G4733 Obstructive sleep apnea (adult) (pediatric): Secondary | ICD-10-CM | POA: Diagnosis not present

## 2023-04-01 DIAGNOSIS — G4733 Obstructive sleep apnea (adult) (pediatric): Secondary | ICD-10-CM | POA: Diagnosis not present

## 2023-04-13 DIAGNOSIS — G4733 Obstructive sleep apnea (adult) (pediatric): Secondary | ICD-10-CM | POA: Diagnosis not present

## 2023-05-14 DIAGNOSIS — G4733 Obstructive sleep apnea (adult) (pediatric): Secondary | ICD-10-CM | POA: Diagnosis not present

## 2023-05-19 DIAGNOSIS — G4733 Obstructive sleep apnea (adult) (pediatric): Secondary | ICD-10-CM | POA: Diagnosis not present

## 2023-06-11 DIAGNOSIS — G4733 Obstructive sleep apnea (adult) (pediatric): Secondary | ICD-10-CM | POA: Diagnosis not present

## 2023-06-19 DIAGNOSIS — G4733 Obstructive sleep apnea (adult) (pediatric): Secondary | ICD-10-CM | POA: Diagnosis not present

## 2023-07-12 DIAGNOSIS — G4733 Obstructive sleep apnea (adult) (pediatric): Secondary | ICD-10-CM | POA: Diagnosis not present

## 2023-07-13 DIAGNOSIS — G4733 Obstructive sleep apnea (adult) (pediatric): Secondary | ICD-10-CM | POA: Diagnosis not present

## 2023-07-15 DIAGNOSIS — G4733 Obstructive sleep apnea (adult) (pediatric): Secondary | ICD-10-CM | POA: Diagnosis not present

## 2023-07-15 DIAGNOSIS — E782 Mixed hyperlipidemia: Secondary | ICD-10-CM | POA: Diagnosis not present

## 2023-07-15 DIAGNOSIS — R7303 Prediabetes: Secondary | ICD-10-CM | POA: Diagnosis not present

## 2023-07-15 DIAGNOSIS — F17200 Nicotine dependence, unspecified, uncomplicated: Secondary | ICD-10-CM | POA: Diagnosis not present

## 2023-07-15 DIAGNOSIS — I10 Essential (primary) hypertension: Secondary | ICD-10-CM | POA: Diagnosis not present

## 2023-07-19 DIAGNOSIS — G4733 Obstructive sleep apnea (adult) (pediatric): Secondary | ICD-10-CM | POA: Diagnosis not present

## 2023-08-11 DIAGNOSIS — G4733 Obstructive sleep apnea (adult) (pediatric): Secondary | ICD-10-CM | POA: Diagnosis not present

## 2023-09-11 DIAGNOSIS — G4733 Obstructive sleep apnea (adult) (pediatric): Secondary | ICD-10-CM | POA: Diagnosis not present

## 2023-10-06 DIAGNOSIS — G4733 Obstructive sleep apnea (adult) (pediatric): Secondary | ICD-10-CM | POA: Diagnosis not present

## 2023-10-11 DIAGNOSIS — G4733 Obstructive sleep apnea (adult) (pediatric): Secondary | ICD-10-CM | POA: Diagnosis not present

## 2023-11-06 DIAGNOSIS — G4733 Obstructive sleep apnea (adult) (pediatric): Secondary | ICD-10-CM | POA: Diagnosis not present

## 2023-11-11 DIAGNOSIS — G4733 Obstructive sleep apnea (adult) (pediatric): Secondary | ICD-10-CM | POA: Diagnosis not present

## 2023-12-07 DIAGNOSIS — G4733 Obstructive sleep apnea (adult) (pediatric): Secondary | ICD-10-CM | POA: Diagnosis not present

## 2023-12-12 DIAGNOSIS — G4733 Obstructive sleep apnea (adult) (pediatric): Secondary | ICD-10-CM | POA: Diagnosis not present

## 2024-01-27 ENCOUNTER — Other Ambulatory Visit: Payer: Self-pay | Admitting: Physician Assistant

## 2024-01-27 DIAGNOSIS — E782 Mixed hyperlipidemia: Secondary | ICD-10-CM | POA: Diagnosis not present

## 2024-01-27 DIAGNOSIS — Z0000 Encounter for general adult medical examination without abnormal findings: Secondary | ICD-10-CM | POA: Diagnosis not present

## 2024-01-27 DIAGNOSIS — E118 Type 2 diabetes mellitus with unspecified complications: Secondary | ICD-10-CM | POA: Diagnosis not present

## 2024-01-27 DIAGNOSIS — Z125 Encounter for screening for malignant neoplasm of prostate: Secondary | ICD-10-CM | POA: Diagnosis not present

## 2024-01-27 DIAGNOSIS — F17200 Nicotine dependence, unspecified, uncomplicated: Secondary | ICD-10-CM

## 2024-01-27 DIAGNOSIS — I10 Essential (primary) hypertension: Secondary | ICD-10-CM | POA: Diagnosis not present

## 2024-01-27 DIAGNOSIS — Z1331 Encounter for screening for depression: Secondary | ICD-10-CM | POA: Diagnosis not present

## 2024-02-03 DIAGNOSIS — G4733 Obstructive sleep apnea (adult) (pediatric): Secondary | ICD-10-CM | POA: Diagnosis not present

## 2024-02-09 ENCOUNTER — Inpatient Hospital Stay
Admission: RE | Admit: 2024-02-09 | Discharge: 2024-02-09 | Disposition: A | Source: Ambulatory Visit | Attending: Physician Assistant | Admitting: Physician Assistant

## 2024-02-09 DIAGNOSIS — F17210 Nicotine dependence, cigarettes, uncomplicated: Secondary | ICD-10-CM | POA: Diagnosis not present

## 2024-02-09 DIAGNOSIS — F17200 Nicotine dependence, unspecified, uncomplicated: Secondary | ICD-10-CM

## 2024-02-29 DIAGNOSIS — J42 Unspecified chronic bronchitis: Secondary | ICD-10-CM | POA: Diagnosis not present

## 2024-02-29 DIAGNOSIS — F17200 Nicotine dependence, unspecified, uncomplicated: Secondary | ICD-10-CM | POA: Diagnosis not present
